# Patient Record
Sex: Female | Born: 1970
Health system: Southern US, Community
[De-identification: ages and names within clinical notes are randomized; demographics above are authoritative.]

## PROBLEM LIST (undated history)

## (undated) DIAGNOSIS — T7840XA Allergy, unspecified, initial encounter: Secondary | ICD-10-CM

## (undated) DIAGNOSIS — M810 Age-related osteoporosis without current pathological fracture: Secondary | ICD-10-CM

## (undated) DIAGNOSIS — C50919 Malignant neoplasm of unspecified site of unspecified female breast: Secondary | ICD-10-CM

## (undated) DIAGNOSIS — Z9221 Personal history of antineoplastic chemotherapy: Secondary | ICD-10-CM

## (undated) HISTORY — PX: TOTAL ABDOMINAL HYSTERECTOMY: SHX209

## (undated) HISTORY — DX: Malignant neoplasm of unspecified site of unspecified female breast: C50.919

## (undated) HISTORY — DX: Allergy, unspecified, initial encounter: T78.40XA

## (undated) HISTORY — PX: BREAST SURGERY: SHX581

## (undated) HISTORY — DX: Age-related osteoporosis without current pathological fracture: M81.0

---

## 2003-01-07 HISTORY — PX: MASTECTOMY: SHX3

## 2003-10-11 HISTORY — PX: TOTAL ABDOMINAL HYSTERECTOMY W/ BILATERAL SALPINGOOPHORECTOMY: SHX83

## 2004-03-12 ENCOUNTER — Ambulatory Visit: Payer: Self-pay | Admitting: Oncology

## 2004-04-12 ENCOUNTER — Ambulatory Visit: Payer: Self-pay | Admitting: Oncology

## 2004-05-12 ENCOUNTER — Ambulatory Visit: Payer: Self-pay | Admitting: Oncology

## 2004-06-16 ENCOUNTER — Ambulatory Visit: Payer: Self-pay | Admitting: Oncology

## 2004-07-13 ENCOUNTER — Ambulatory Visit: Payer: Self-pay | Admitting: Oncology

## 2004-08-10 ENCOUNTER — Ambulatory Visit: Payer: Self-pay | Admitting: Oncology

## 2004-09-10 ENCOUNTER — Ambulatory Visit: Payer: Self-pay | Admitting: Oncology

## 2004-09-26 ENCOUNTER — Encounter: Payer: Self-pay | Admitting: Oncology

## 2004-10-10 ENCOUNTER — Encounter: Payer: Self-pay | Admitting: Oncology

## 2004-10-21 ENCOUNTER — Ambulatory Visit: Payer: Self-pay | Admitting: Oncology

## 2004-11-10 ENCOUNTER — Ambulatory Visit: Payer: Self-pay | Admitting: Oncology

## 2004-11-18 ENCOUNTER — Ambulatory Visit: Payer: Self-pay | Admitting: General Surgery

## 2004-12-10 ENCOUNTER — Ambulatory Visit: Payer: Self-pay | Admitting: Oncology

## 2005-01-13 ENCOUNTER — Ambulatory Visit: Payer: Self-pay | Admitting: Oncology

## 2005-02-10 ENCOUNTER — Ambulatory Visit: Payer: Self-pay | Admitting: Oncology

## 2005-03-12 ENCOUNTER — Ambulatory Visit: Payer: Self-pay | Admitting: Oncology

## 2005-06-23 ENCOUNTER — Ambulatory Visit: Payer: Self-pay | Admitting: Oncology

## 2005-07-13 ENCOUNTER — Ambulatory Visit: Payer: Self-pay | Admitting: Oncology

## 2005-09-26 ENCOUNTER — Ambulatory Visit: Payer: Self-pay | Admitting: General Practice

## 2005-10-10 ENCOUNTER — Ambulatory Visit: Payer: Self-pay | Admitting: General Practice

## 2005-10-13 ENCOUNTER — Ambulatory Visit: Payer: Self-pay | Admitting: Oncology

## 2005-11-10 ENCOUNTER — Ambulatory Visit: Payer: Self-pay | Admitting: General Practice

## 2005-11-15 ENCOUNTER — Ambulatory Visit: Payer: Self-pay | Admitting: General Surgery

## 2005-12-10 ENCOUNTER — Ambulatory Visit: Payer: Self-pay | Admitting: General Practice

## 2005-12-26 ENCOUNTER — Ambulatory Visit: Payer: Self-pay | Admitting: Oncology

## 2006-01-03 ENCOUNTER — Encounter: Payer: Self-pay | Admitting: Family Medicine

## 2006-01-10 ENCOUNTER — Encounter: Payer: Self-pay | Admitting: Family Medicine

## 2006-01-10 ENCOUNTER — Ambulatory Visit: Payer: Self-pay | Admitting: General Practice

## 2006-01-10 ENCOUNTER — Ambulatory Visit: Payer: Self-pay | Admitting: Oncology

## 2006-02-27 ENCOUNTER — Ambulatory Visit: Payer: Self-pay | Admitting: Oncology

## 2006-03-12 ENCOUNTER — Ambulatory Visit: Payer: Self-pay | Admitting: Oncology

## 2006-04-12 ENCOUNTER — Ambulatory Visit: Payer: Self-pay | Admitting: Oncology

## 2006-06-27 ENCOUNTER — Ambulatory Visit: Payer: Self-pay | Admitting: Oncology

## 2006-07-13 ENCOUNTER — Ambulatory Visit: Payer: Self-pay | Admitting: Oncology

## 2006-10-25 ENCOUNTER — Ambulatory Visit: Payer: Self-pay | Admitting: Oncology

## 2006-11-11 ENCOUNTER — Ambulatory Visit: Payer: Self-pay | Admitting: Oncology

## 2006-11-16 ENCOUNTER — Ambulatory Visit: Payer: Self-pay | Admitting: General Surgery

## 2007-02-11 ENCOUNTER — Ambulatory Visit: Payer: Self-pay | Admitting: Oncology

## 2007-02-21 ENCOUNTER — Ambulatory Visit: Payer: Self-pay | Admitting: Oncology

## 2007-03-13 ENCOUNTER — Ambulatory Visit: Payer: Self-pay | Admitting: Oncology

## 2007-06-13 ENCOUNTER — Ambulatory Visit: Payer: Self-pay | Admitting: Oncology

## 2007-06-27 ENCOUNTER — Ambulatory Visit: Payer: Self-pay | Admitting: Oncology

## 2007-07-14 ENCOUNTER — Ambulatory Visit: Payer: Self-pay | Admitting: Oncology

## 2007-07-30 ENCOUNTER — Ambulatory Visit: Payer: Self-pay | Admitting: Family Medicine

## 2007-09-11 ENCOUNTER — Ambulatory Visit: Payer: Self-pay | Admitting: Oncology

## 2007-10-11 ENCOUNTER — Ambulatory Visit: Payer: Self-pay | Admitting: Oncology

## 2007-10-14 ENCOUNTER — Ambulatory Visit: Payer: Self-pay | Admitting: Oncology

## 2007-10-29 ENCOUNTER — Ambulatory Visit: Payer: Self-pay | Admitting: Specialist

## 2007-11-11 ENCOUNTER — Ambulatory Visit: Payer: Self-pay | Admitting: Oncology

## 2007-11-20 ENCOUNTER — Ambulatory Visit: Payer: Self-pay | Admitting: General Surgery

## 2008-01-11 ENCOUNTER — Ambulatory Visit: Payer: Self-pay | Admitting: Oncology

## 2008-01-13 ENCOUNTER — Ambulatory Visit: Payer: Self-pay | Admitting: Oncology

## 2008-02-11 ENCOUNTER — Ambulatory Visit: Payer: Self-pay | Admitting: Oncology

## 2008-02-26 ENCOUNTER — Ambulatory Visit: Payer: Self-pay | Admitting: Unknown Physician Specialty

## 2008-02-28 ENCOUNTER — Ambulatory Visit: Payer: Self-pay | Admitting: Unknown Physician Specialty

## 2008-04-12 ENCOUNTER — Ambulatory Visit: Payer: Self-pay | Admitting: Oncology

## 2008-05-01 ENCOUNTER — Encounter: Payer: Self-pay | Admitting: Family Medicine

## 2008-05-11 ENCOUNTER — Ambulatory Visit: Payer: Self-pay | Admitting: Oncology

## 2008-05-12 ENCOUNTER — Ambulatory Visit: Payer: Self-pay | Admitting: Oncology

## 2008-05-12 ENCOUNTER — Encounter: Payer: Self-pay | Admitting: Family Medicine

## 2008-06-12 ENCOUNTER — Encounter: Payer: Self-pay | Admitting: Family Medicine

## 2008-10-10 ENCOUNTER — Ambulatory Visit: Payer: Self-pay | Admitting: Oncology

## 2008-11-09 ENCOUNTER — Ambulatory Visit: Payer: Self-pay | Admitting: Oncology

## 2008-11-10 ENCOUNTER — Ambulatory Visit: Payer: Self-pay | Admitting: Oncology

## 2008-11-24 ENCOUNTER — Ambulatory Visit: Payer: Self-pay | Admitting: General Surgery

## 2009-04-12 ENCOUNTER — Ambulatory Visit: Payer: Self-pay | Admitting: Oncology

## 2009-05-10 ENCOUNTER — Ambulatory Visit: Payer: Self-pay | Admitting: Oncology

## 2009-05-12 ENCOUNTER — Ambulatory Visit: Payer: Self-pay | Admitting: Oncology

## 2009-10-10 ENCOUNTER — Ambulatory Visit: Payer: Self-pay | Admitting: Oncology

## 2009-11-08 ENCOUNTER — Ambulatory Visit: Payer: Self-pay | Admitting: Oncology

## 2009-11-10 ENCOUNTER — Ambulatory Visit: Payer: Self-pay | Admitting: Oncology

## 2009-11-30 ENCOUNTER — Ambulatory Visit: Payer: Self-pay | Admitting: General Surgery

## 2010-05-20 ENCOUNTER — Ambulatory Visit: Payer: Self-pay | Admitting: Oncology

## 2010-05-21 LAB — CANCER ANTIGEN 27.29: CA 27.29: 13.3 U/mL (ref 0.0–38.6)

## 2010-06-12 ENCOUNTER — Ambulatory Visit: Payer: Self-pay | Admitting: Oncology

## 2010-10-20 ENCOUNTER — Other Ambulatory Visit: Payer: Self-pay | Admitting: Physician Assistant

## 2010-12-01 ENCOUNTER — Ambulatory Visit: Payer: Self-pay | Admitting: Oncology

## 2010-12-02 ENCOUNTER — Ambulatory Visit: Payer: Self-pay | Admitting: Oncology

## 2010-12-03 LAB — CANCER ANTIGEN 27.29: CA 27.29: 11.9 U/mL (ref 0.0–38.6)

## 2010-12-11 ENCOUNTER — Ambulatory Visit: Payer: Self-pay | Admitting: Oncology

## 2011-05-24 ENCOUNTER — Ambulatory Visit: Payer: Self-pay | Admitting: Otolaryngology

## 2011-06-20 ENCOUNTER — Ambulatory Visit: Payer: Self-pay | Admitting: Oncology

## 2011-06-20 LAB — CBC CANCER CENTER
Basophil %: 0.5 %
Eosinophil %: 1.9 %
HCT: 36.2 % (ref 35.0–47.0)
HGB: 12.3 g/dL (ref 12.0–16.0)
Lymphocyte %: 33.9 %
MCHC: 33.8 g/dL (ref 32.0–36.0)
Monocyte %: 8.4 %
Neutrophil #: 2.4 x10 3/mm (ref 1.4–6.5)
Neutrophil %: 55.3 %
RBC: 4.07 10*6/uL (ref 3.80–5.20)

## 2011-06-20 LAB — COMPREHENSIVE METABOLIC PANEL
Albumin: 3.8 g/dL (ref 3.4–5.0)
Alkaline Phosphatase: 79 U/L (ref 50–136)
Anion Gap: 6 — ABNORMAL LOW (ref 7–16)
BUN: 13 mg/dL (ref 7–18)
Co2: 33 mmol/L — ABNORMAL HIGH (ref 21–32)
Creatinine: 0.79 mg/dL (ref 0.60–1.30)
EGFR (Non-African Amer.): >60
Glucose: 81 mg/dL (ref 65–99)
Osmolality: 286 (ref 275–301)
Potassium: 3.7 mmol/L (ref 3.5–5.1)
Sodium: 144 mmol/L (ref 136–145)
Total Protein: 6.9 g/dL (ref 6.4–8.2)

## 2011-07-14 ENCOUNTER — Ambulatory Visit: Payer: Self-pay | Admitting: Oncology

## 2011-09-07 ENCOUNTER — Ambulatory Visit: Payer: Self-pay | Admitting: Specialist

## 2011-09-22 ENCOUNTER — Ambulatory Visit: Payer: Self-pay | Admitting: General Practice

## 2011-09-25 ENCOUNTER — Emergency Department: Payer: Self-pay | Admitting: Emergency Medicine

## 2011-09-25 LAB — URINALYSIS, COMPLETE
Bilirubin,UR: NEGATIVE
Ketone: NEGATIVE
Leukocyte Esterase: NEGATIVE
Ph: 9 (ref 4.5–8.0)
Protein: NEGATIVE
RBC,UR: <1 /HPF (ref 0–5)
Specific Gravity: 1.006 (ref 1.003–1.030)
Squamous Epithelial: <1
WBC UR: 1 /HPF (ref 0–5)

## 2011-09-25 LAB — CBC
HCT: 40.6 % (ref 35.0–47.0)
MCHC: 32.7 g/dL (ref 32.0–36.0)
MCV: 90 fL (ref 80–100)
Platelet: 192 10*3/uL (ref 150–440)
WBC: 6.2 10*3/uL (ref 3.6–11.0)

## 2011-09-25 LAB — COMPREHENSIVE METABOLIC PANEL
Albumin: 3.9 g/dL (ref 3.4–5.0)
Alkaline Phosphatase: 64 U/L (ref 50–136)
Anion Gap: 7 (ref 7–16)
BUN: 10 mg/dL (ref 7–18)
Bilirubin,Total: 0.6 mg/dL (ref 0.2–1.0)
Calcium, Total: 9.2 mg/dL (ref 8.5–10.1)
Creatinine: 0.82 mg/dL (ref 0.60–1.30)
EGFR (Non-African Amer.): >60
Glucose: 79 mg/dL (ref 65–99)
Osmolality: 279 (ref 275–301)
Potassium: 3.6 mmol/L (ref 3.5–5.1)
SGPT (ALT): 19 U/L
Total Protein: 7.1 g/dL (ref 6.4–8.2)

## 2011-09-25 LAB — LIPASE, BLOOD: Lipase: 150 U/L (ref 73–393)

## 2011-11-16 ENCOUNTER — Ambulatory Visit: Payer: Self-pay | Admitting: Oncology

## 2011-12-06 ENCOUNTER — Ambulatory Visit: Payer: Self-pay | Admitting: Oncology

## 2012-01-03 ENCOUNTER — Ambulatory Visit: Payer: Self-pay | Admitting: Oncology

## 2012-01-10 LAB — COMPREHENSIVE METABOLIC PANEL
Albumin: 4.1 g/dL (ref 3.4–5.0)
Anion Gap: 8 (ref 7–16)
BUN: 12 mg/dL (ref 7–18)
Calcium, Total: 9.1 mg/dL (ref 8.5–10.1)
Chloride: 103 mmol/L (ref 98–107)
EGFR (African American): >60
Glucose: 94 mg/dL (ref 65–99)
Osmolality: 281 (ref 275–301)
Potassium: 4.2 mmol/L (ref 3.5–5.1)
Sodium: 141 mmol/L (ref 136–145)
Total Protein: 7.1 g/dL (ref 6.4–8.2)

## 2012-01-10 LAB — CBC CANCER CENTER
Basophil %: 1.1 %
Eosinophil #: 0.1 x10 3/mm (ref 0.0–0.7)
HCT: 38.5 % (ref 35.0–47.0)
HGB: 12.9 g/dL (ref 12.0–16.0)
Lymphocyte %: 38.3 %
MCHC: 33.5 g/dL (ref 32.0–36.0)
Monocyte %: 10.7 %
Neutrophil #: 1.8 x10 3/mm (ref 1.4–6.5)
Neutrophil %: 47.1 %
RBC: 4.25 10*6/uL (ref 3.80–5.20)
WBC: 3.8 x10 3/mm (ref 3.6–11.0)

## 2012-01-11 ENCOUNTER — Ambulatory Visit: Payer: Self-pay | Admitting: Oncology

## 2012-01-11 LAB — CANCER ANTIGEN 27.29: CA 27.29: 5.8 U/mL (ref 0.0–38.6)

## 2012-04-29 ENCOUNTER — Ambulatory Visit: Payer: Self-pay | Admitting: General Practice

## 2012-06-12 ENCOUNTER — Ambulatory Visit: Payer: Self-pay | Admitting: Oncology

## 2013-02-05 ENCOUNTER — Ambulatory Visit: Payer: Self-pay | Admitting: Family Medicine

## 2013-04-29 ENCOUNTER — Emergency Department: Payer: Self-pay | Admitting: Emergency Medicine

## 2013-06-04 ENCOUNTER — Ambulatory Visit: Payer: Self-pay | Admitting: General Practice

## 2013-11-19 ENCOUNTER — Ambulatory Visit (INDEPENDENT_AMBULATORY_CARE_PROVIDER_SITE_OTHER): Payer: 59 | Admitting: Sports Medicine

## 2013-11-19 ENCOUNTER — Encounter: Payer: Self-pay | Admitting: Sports Medicine

## 2013-11-19 VITALS — BP 116/77 | Ht 67.5 in | Wt 144.0 lb

## 2013-11-19 DIAGNOSIS — M6580 Other synovitis and tenosynovitis, unspecified site: Secondary | ICD-10-CM

## 2013-11-19 DIAGNOSIS — IMO0002 Reserved for concepts with insufficient information to code with codable children: Secondary | ICD-10-CM

## 2013-11-19 DIAGNOSIS — R1031 Right lower quadrant pain: Secondary | ICD-10-CM

## 2013-11-19 DIAGNOSIS — R269 Unspecified abnormalities of gait and mobility: Secondary | ICD-10-CM | POA: Insufficient documentation

## 2013-11-19 DIAGNOSIS — M76899 Other specified enthesopathies of unspecified lower limb, excluding foot: Secondary | ICD-10-CM | POA: Insufficient documentation

## 2013-11-19 DIAGNOSIS — S76019A Strain of muscle, fascia and tendon of unspecified hip, initial encounter: Secondary | ICD-10-CM

## 2013-11-19 NOTE — Progress Notes (Signed)
Subjective:   Kendra [name] is a 43 y.o. year old very pleasant female patient who presents with the following:  Right Groin Pain Present since March of this year. Patient had started back a running program after taking time off in winter months. States had pain in right groin radiating to right knee. Pain rated as moderate aching after about 6 minutes of running. She was able to complete a 5k but after that time pain continued to worsen so she stopped running. She does have a history of some low back pain radiating into posterior portion of thigh but this does not feel the same as her current discomfort.   She had previously had some Right knee pain dating to November 2014 which had improved before groin pain started. She had at that time had an MRI showing fat pad impingement as well as a a possible mild tear of LCL (thoughs she is not sure of this diagnosis). She was placed on Mobic and advised to rest and this improved pain ROS- no numbness/tingling in legs. No fecal or urinary issues No changes in weight.   Past Medical History- Breast Cancer 10 years ago that was treated with mastectomy as well as hysterectomy complete as hormone related. She has been cancer free since treatment She is on fosamax and has upcoming bone density study  Surgical History- Hysterectomy and mastectomy  Medications- reviewed and updated Current Outpatient Prescriptions  Medication Sig Dispense Refill  . alendronate (FOSAMAX) 10 MG tablet Take 10 mg by mouth daily before breakfast. Take with a full glass of water on an empty stomach.       No current facility-administered medications for this visit.   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 Nonsmoker  Objective: BP 116/77  Ht 5' 7.5" (1.715 m)  Wt 144 lb (65.318 kg)  BMI 22.21 kg/m2 Gen: NAD, resting comfortably Ext: no edema, mild warmth in R knee compared to left Skin:  warm, dry, no rash   Hip: ROM IR: 80 Deg, ER: 80 Deg, Flexion: 120 Deg, Extension: 100 Deg, Abduction: 45 Deg, Adduction: 45 Deg Strength IR: 5/5, ER: 5/5, Flexion: 5/5, Extension: 5/5, Abduction: 5/5, Adduction: 4-/5 Pelvic alignment unremarkable to inspection and palpation. Standing hip rotation and gait without trendelenburg / unsteadiness. She does have a minor drop in the right hip compared to left with walking and right foot lands early in eversion.  Greater trochanter without tenderness to palpation. No tenderness over piriformis and greater trochanter. No SI joint tenderness and normal minimal SI movement.   MSK U/S:   Right knee- right quadricep tendon without any abnormality, no effusion noted, normal meniscus, right pes anserine bursa with small hypoechoic area likely reflecting mild pes anserine bursitis  Right groin- large hypoechoic region noted in right groin at right iliopectineal bursa likely representing iliopectineal bursitis  Assessment/Plan:  Hip adductor strain/weakness Difficult to tell if patient had weakness and later developed strain or if had strain and later developed hip adductor weakness. Regardless, treatment will be strengthening of hip adductors. Believe weakness has caused gait abnormality as well as pes anserine and iliopectineal bursitis and these will likely improving with correcting underlying issue. We gave patietn a home exercise program.  Offered nitroglycerin patch therapy but patient would like to try strengthening exercises first. We have also placed her in a compression sleeve to use with activity. She is to avoid deep hip flexion with her personal trainer. Follow up in  6 weeks.

## 2013-11-19 NOTE — Patient Instructions (Addendum)
Thanks for coming to see [place]!   Issues found: hip adductor weakness and possible tendopathy, gait abnormality related to hip adductor weakness, bursitis in the groin related to weakness, mild pes anserine bursitis  1. Wear compression sleeve with activity 2. Do exercises we discussed (at least 5 days a week, twice a day if possible)  3 sets of 10-15 (3 exercises) moving right leg across left leg standing, and then move across left leg with knee pulled up, and lifting right leg off of table while laying down.   1 set of 10- 15 ( 3 exercises) right thigh back, to the side, and to the front 3. Avoid deep hip flexion with personal training *opted to not use nitroglycerin patch but we will consider in future

## 2013-11-19 NOTE — Assessment & Plan Note (Signed)
HEP for hip and particularly adductors  Compression sleeve  Progressive increase in exercise with walk/runreck 6 wks  Consider NTG if no response

## 2013-11-19 NOTE — Assessment & Plan Note (Signed)
See if gait improves with strength training  If not consider arch support to RT

## 2013-11-21 ENCOUNTER — Ambulatory Visit: Payer: Self-pay | Admitting: Family Medicine

## 2013-11-21 LAB — HM DEXA SCAN

## 2013-11-24 LAB — LIPID PANEL
Cholesterol: 169 mg/dL (ref 0–200)
HDL: 50 mg/dL (ref 35–70)
LDL Cholesterol: 105 mg/dL
Triglycerides: 70 mg/dL (ref 40–160)

## 2013-11-24 LAB — CBC AND DIFFERENTIAL
HCT: 39 % (ref 36–46)
Hemoglobin: 12.8 g/dL (ref 12.0–16.0)
PLATELETS: 158 10*3/uL (ref 150–399)
WBC: 3.2 10^3/mL

## 2013-11-24 LAB — TSH: TSH: 2.24 u[IU]/mL (ref 0.41–5.90)

## 2013-11-24 LAB — HEPATIC FUNCTION PANEL
ALT: 27 U/L (ref 7–35)
AST: 23 U/L (ref 13–35)

## 2013-11-24 LAB — BASIC METABOLIC PANEL
BUN: 11 mg/dL (ref 4–21)
Creatinine: 0.7 mg/dL (ref 0.5–1.1)
GLUCOSE: 90 mg/dL
POTASSIUM: 4.7 mmol/L (ref 3.4–5.3)
SODIUM: 144 mmol/L (ref 137–147)

## 2014-01-06 ENCOUNTER — Ambulatory Visit: Payer: 59 | Admitting: Sports Medicine

## 2014-01-20 ENCOUNTER — Ambulatory Visit (INDEPENDENT_AMBULATORY_CARE_PROVIDER_SITE_OTHER): Payer: 59 | Admitting: Sports Medicine

## 2014-01-20 ENCOUNTER — Encounter: Payer: Self-pay | Admitting: Sports Medicine

## 2014-01-20 VITALS — BP 100/60 | Ht 67.5 in | Wt 145.0 lb

## 2014-01-20 DIAGNOSIS — M6580 Other synovitis and tenosynovitis, unspecified site: Secondary | ICD-10-CM

## 2014-01-20 DIAGNOSIS — M76899 Other specified enthesopathies of unspecified lower limb, excluding foot: Secondary | ICD-10-CM

## 2014-01-20 MED ORDER — NITROGLYCERIN 0.2 MG/HR TD PT24: MEDICATED_PATCH | TRANSDERMAL | Status: DC

## 2014-01-20 NOTE — Progress Notes (Signed)
   Subjective:   Kendra [name] is a 43 y.o. year old very pleasant female patient who presents with the following right groin pain. Pain initially started in March of 2015. She presented in June with continued discomfort with any kind of physical activity particularly running, cycling and resistance training. Patient's last visit she was found to have a fairly significant iliopectineal bursitis on ultrasound exam as well as a hip abductor strain and weakness. Which is suspected to be related to gait abnormalities. Patient was provided with home exercises to work on strengthening and also provided with a compression sleeve. For the past 6 weeks patient reports improved comfort with a compression sleeve and improve strength with exercising. However she does not feel that she has any improvement in pain. These have discomfort and burning on starting at the abductor insertion point and really into her medial thigh  ROS- no numbness/tingling in legs.   Past Medical History- Breast Cancer 10 years ago that was treated with mastectomy as well as hysterectomy complete as hormone related. She has been cancer free since treatment He has since discontinued Fosamax for osteoporosis.  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 Nonsmoker  Objective: BP 116/77  Ht 5' 7.5" (1.715 m)  Wt 144 lb (65.318 kg)  BMI 22.21 kg/m2 Gen: NAD, resting comfortably Ext: no edema, mild warmth in R knee compared to left Skin: warm, dry, no rash   Hip: ROM IR: and ER: total 80 Deg bilat, Flexion: 120 Deg, Extension: 100 Deg, Abduction: 45 Deg, Adduction: 45 Deg Strength IR: 5/5, ER: 5/5, Flexion: 5/5, Extension: 5/5, Abduction: 5/5, Adduction: 5/5 Pelvic alignment unremarkable to inspection and palpation. Still tender at deep adductor on RT prox thigh Standing hip rotation and gait without trendelenburg / unsteadiness.  No tenderness over piriformis and greater  trochanter. No SI joint tenderness and normal minimal SI movement.  Assessment/Plan:  Hip adductor strain/iliopectineal bursitis:  the patient has had minimal clinical improvement with conservative management of exercises at this point would recommend more aggressive therapy. Advised patient that this is likely from the bursitis found on ultrasound previously at the iliopectineal bursa. Recommended continuing exercises for strengthening she can continue her regular fitness exercise activity as tolerated for pain. But as far as management I think it is time to try a nitroglycerin protocol to help with this tendinopathy. Will followup in 4-6 weeks and at that time will repeat her ultrasound to see if she has any reduction in the size of this bursa inflammation

## 2014-01-20 NOTE — Patient Instructions (Addendum)

## 2014-01-21 NOTE — Assessment & Plan Note (Signed)
See note will try NTG protocol If not improved consider US guided injection to bursa

## 2014-02-06 ENCOUNTER — Ambulatory Visit: Payer: Self-pay | Admitting: Family Medicine

## 2014-02-06 LAB — HM MAMMOGRAPHY

## 2014-03-03 ENCOUNTER — Ambulatory Visit (INDEPENDENT_AMBULATORY_CARE_PROVIDER_SITE_OTHER): Payer: 59 | Admitting: Sports Medicine

## 2014-03-03 ENCOUNTER — Encounter: Payer: Self-pay | Admitting: Sports Medicine

## 2014-03-03 VITALS — BP 107/66 | Ht 67.5 in | Wt 145.0 lb

## 2014-03-03 DIAGNOSIS — M6580 Other synovitis and tenosynovitis, unspecified site: Secondary | ICD-10-CM

## 2014-03-03 DIAGNOSIS — R269 Unspecified abnormalities of gait and mobility: Secondary | ICD-10-CM

## 2014-03-03 DIAGNOSIS — M76899 Other specified enthesopathies of unspecified lower limb, excluding foot: Secondary | ICD-10-CM

## 2014-03-04 ENCOUNTER — Encounter: Payer: Self-pay | Admitting: Sports Medicine

## 2014-03-04 NOTE — Progress Notes (Signed)
   Subjective:   Kendra [name] is a 43 y.o. year old very pleasant female patient who presents with the following right groin pain.   Pain initially started in March of 2015.  She presented in June with continued discomfort with physical activity particularly running, cycling and resistance training.  She was found to have a fairly significant iliopectineal bursitis on ultrasound exam as well as a hip abductor strain and weakness. Which is suspected to be related to gait abnormalities. Patient was provided with home exercises to work on strengthening and also provided with a compression sleeve.   Over the first 6 weeks of treatment patient reports improved comfort with a compression sleeve and improve strength with exercising. However she does not feel that she has any improvement in pain. These have discomfort and burning on starting at the abductor insertion point and really into her medial thigh.  At her last OV early in August patient was recommended to try the nitroglycerin protocol along the adductor tendon insertion, continuing conpression sleeve, and strengthening exercises.  Patient presents today for 3 week f/u after starting Nitro protocol. She reports significant improvement her pain has resolved she has returned to running and cycling with no difficulty. She would like to return to resistance training but wanted to check on any restrictions before starting.   ROS- no numbness/tingling in legs.   Past Medical History- Breast Cancer 10 years ago that was treated with mastectomy as well as hysterectomy complete as hormone related. She has been cancer free since treatment He has since discontinued Fosamax for osteoporosis.  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 Standing hip rotation and gait without trendelenburg / unsteadiness.  No tenderness over piriformis and greater trochanter. No SI joint tenderness and normal minimal SI movement.  MSK [place]: resolution of adductor muscle hypoechoic changes seen previously with great muscle appear.   Assessment/Plan: Hip adductor strain/iliopectineal bursitis: Patient had great clinical response to nitro protocol Recommendations: Continue nitro protocol for 3-4 more week as long as she can tolerate the headaches then discontinue to see how she does Continue strengthening exercises for 6 more weeks Continue thigh sleeve for 3-6 months before discontinuing Patient may start resistance training avoiding deep squats and lunges no greater than 45 degree of knee flexion.

## 2014-04-28 ENCOUNTER — Encounter: Payer: Self-pay | Admitting: Sports Medicine

## 2014-04-28 ENCOUNTER — Ambulatory Visit (INDEPENDENT_AMBULATORY_CARE_PROVIDER_SITE_OTHER): Payer: 59 | Admitting: Sports Medicine

## 2014-04-28 VITALS — BP 113/78 | HR 72 | Ht 67.0 in | Wt 145.0 lb

## 2014-04-28 DIAGNOSIS — R269 Unspecified abnormalities of gait and mobility: Secondary | ICD-10-CM

## 2014-04-28 DIAGNOSIS — M6751 Plica syndrome, right knee: Secondary | ICD-10-CM

## 2014-04-28 NOTE — Patient Instructions (Signed)
Light jogging on a straight line for 100 meters repeat 5 times Pigeon toe walks on a straight line for 100 meter repeat 5 times

## 2014-04-29 DIAGNOSIS — M6750 Plica syndrome, unspecified knee: Secondary | ICD-10-CM | POA: Insufficient documentation

## 2014-04-29 NOTE — Progress Notes (Signed)
   Subjective:   Kendra [name] is a 43 y.o. year old very pleasant female patient who presents with a new c/o right knee pain.   Pain initially started in the past several weeks. She localize the pain on the medial aspect of the patella. She describes the pain as sharp particularly when running, deep bending, and squatting in her workouts. She has not tried any medications, brace, but has been working on hip muscle strength since her adductor strain that was going on the last several months that resolved with Nitro protocol and strengthening.    ROS- no numbness/tingling in legs.   Past Medical History- Breast Cancer 10 years ago that was treated with mastectomy as well as hysterectomy complete as hormone related. She has been cancer free since treatment He has since discontinued Fosamax for osteoporosis.  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 Vascular: Dorsal pedal pulses 2+ bilaterally Neurologically: Sensation to light touch lower extremities equal and intact bilaterally.  Observation: no knee effusion visualized, no previous surgical scars, patella in place, no quad muscle atrophy Palpation:  Tenderness over the medial superior joint capsule TTP to a plica.  No evidence of knee effusion with negative ballotable patella sign No tenderness over the tibial tuberosity or patellar tendons, or quadriceps tendon.   No pain along the lateral joint line or medial joint line Range of motion:  Full ROM at the hip. Full knee flexion to approximately 140, full extension 0, normal patellar tracking Ligamentous testing: ligamentous stablity of ACL, MCL, LCL, and PCL. With Lachman's test, anterior drawer, posterior drawer, valgus and varus  stress test Negative patella apprehension test Meniscal evaluation: Negative McMurray's test, Negative thessaly's test, Normal gait  FOOT EXAM:  General: well nourished Skin of LE: warm; dry, no rashes, lesions, ecchymosis or erythema. Vascular: Dorsal pedal pulses 2+ bilaterally Neurologically: Sensation to light touch lower extremities equal and intact bilaterally.  Observation - no ecchymosis, no edema, or hematoma present  Palpation: no TTP Normal ankle motion and strength bilaterally  Extension/flexion 5/5 strength bilaterally in toes Weight-bearing foot exam:  First ray: neutral  Second ray: neutral Longitudinal arch: mid arch collapse Heel position: mild valgue Gait analysis:  Striking location: on the right she heal strikes on the medial foot Foot motion: pronation  Knee motion: valgus L>R Hip motion: interval rotation  MSK [place]:  Normal PT, QT, medial and lateral meniscus are intact with no splitting. There is a medial plica at the medial superior pole of the knee with no surrounding hypoechoic changes.    Assessment/Plan: Suspect that patient knee pain is from poor biomechanics and inflammation of right knee plica.   Recommendations: Patient was as given sports insole with scaphoid pad to place her in neutral  Quad strengthening with short arch quad with ball squeeze Light jogging on a straight line for 100 meters repeat 5 times Pigeon toe walks on a straight line for 100 meter repeat 5 times

## 2014-05-28 ENCOUNTER — Ambulatory Visit: Payer: 59 | Admitting: Sports Medicine

## 2014-07-31 ENCOUNTER — Ambulatory Visit (INDEPENDENT_AMBULATORY_CARE_PROVIDER_SITE_OTHER): Payer: 59 | Admitting: Sports Medicine

## 2014-07-31 ENCOUNTER — Encounter: Payer: Self-pay | Admitting: Sports Medicine

## 2014-07-31 VITALS — BP 137/86 | Ht 67.0 in | Wt 146.0 lb

## 2014-07-31 DIAGNOSIS — M79672 Pain in left foot: Secondary | ICD-10-CM

## 2014-07-31 DIAGNOSIS — M25561 Pain in right knee: Secondary | ICD-10-CM

## 2014-07-31 NOTE — Patient Instructions (Signed)
Restart your Nitroglycerin and apply 1/4 patch over the inside of the ankle  Please go get the X-rays of your foot and knee  Home Exercise Program: Start with 2 sets of 10 for each of these.  Foot: INTOE walking, InTOE Heel Raises up fast; down slow (over 6-7 seconds)  Knee:  QUAD SETS HAMSTRING  Wear the compression during the day

## 2014-08-03 NOTE — Progress Notes (Signed)
  RADONNA BRACHER - 44 y.o. female MRN [id number]  Date of birth: 06-08-1971  SUBJECTIVE: CC: left foot pain initial evaluation; right medial knee pain follow-up HPI: patient reports worsening left foot pain following her last office visit. She has been having increasing pain while at work and with activity.  Left foot pain is over the medial aspect of her mid foot. Worse with prolonged activity.  Right knee pain is essentially unchanged from prior evaluations.  She has been performing some of the therapeutic exercises for her abductor tendinitis. She is having worsening pain with terminal flexion.  She reports some symptoms of instability but denies any significant locking, popping or actual giving way.   ROS: per HPI  HISTORY:  Past Medical, Surgical, Social, and Family History reviewed & updated per EMR.  Pertinent Historical Findings include:  reports that she has never smoked. She does not have any smokeless tobacco history on file.  OBJECTIVE:  VS:   HT:5\' 7"  (170.2 cm)   WT:146 lb (66.225 kg)  BMI:22.9          BP:137/86 mmHg  HR: bpm  TEMP: ( )  RESP:   PHYSICAL EXAM:  GENERAL: adult, Caucasian. No acute distress PSYCH: Alert and appropriately interactive. VASCULAR: DP andPT pulses 2+/4, no pretibial edema NEURO: Lower extremity strength is 5+/5 in all myotomes; sensation is intact to light touch in all dermatomes. LEFT FOOT: Overall normal-appearing, she does have marked tenderness palpation over the insertion of the tibialis posterior. Overall foot is well aligned. She does have slight splay toe between the first and second bilaterally. Overall moderate PT recruitment with heel raises but does seem to fatigue with one footed. RIGHT KNEE: overall well aligned, no significant effusion,ROM:  0-125. Stable to varus and valgus strain. She does have laxity with anterior drawer and Lachman's with a poor endpoint when compared to the left. Negative McMurray's. Negative Nobles and  Ober test. Hip abduction strength 5+/5.  DATA OBTAINED DURING VISIT: LIMITED MSK ULTRASOUND OF LEFT FOOT: Findings:  Significant thickening and hypoechoic change at the insertion of the posterior tibialis on the navicular with a  Hyperechoic irregularity within the distal insertion. No splitting of the tendons Impression: insertional tendinopathy of the posterior tibialis with either  Excess or navicular or partial avulsion  ASSESSMENT: 1. Right knee pain   2. Left foot pain    No problems updated.   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 Unclear etiology of knee pain. Likely some degenerative changes. 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  PLAN: See problem based charting & AVS for additional documentation.  VMO exercises for right knee, continue body helix  Start nitroglycerin protocol for left ankle  Body Helix left ankle full sleeve compression sleeve provided today. Discussed using during activity as well as for the first 2 hours following activity and PRN.  Continue with sports insoles but trimmed small scaphoid pad, consider removing if continues to be symptomatic  X-rays for right knee and left ankle ordered and patient will obtain in the coming weeks. > Return in about 6 weeks (around 09/11/2014).

## 2014-08-05 ENCOUNTER — Ambulatory Visit: Payer: Self-pay | Admitting: Sports Medicine

## 2014-08-27 ENCOUNTER — Encounter
Admit: 2014-08-27 | Disposition: A | Discharge status: Home / Self Care | Payer: Self-pay | Attending: Physician Assistant | Admitting: Physician Assistant

## 2014-09-01 ENCOUNTER — Encounter: Payer: Self-pay | Admitting: *Deleted

## 2014-09-01 ENCOUNTER — Encounter: Payer: Self-pay | Admitting: Sports Medicine

## 2014-09-02 ENCOUNTER — Ambulatory Visit: Payer: 59 | Admitting: Sports Medicine

## 2014-09-11 ENCOUNTER — Encounter
Admit: 2014-09-11 | Disposition: A | Discharge status: Home / Self Care | Payer: Self-pay | Attending: Physician Assistant | Admitting: Physician Assistant

## 2014-09-18 ENCOUNTER — Encounter: Payer: Self-pay | Admitting: Sports Medicine

## 2014-10-04 NOTE — Op Note (Signed)
PATIENT NAME:  Kendra [name], Kendra [name] MR#:  [id number] DATE OF BIRTH:  [birth year]  DATE OF PROCEDURE:  09/07/2011  PREOPERATIVE DIAGNOSIS: Bilateral de Quervain's tendinitis of the wrist.   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   PROCEDURES:  1. Release de Quervain's tendinitis, right wrist.  2. Release de Quervain's tendinitis, left wrist.   SURGEON: Christophe Louis, M.D.   ANESTHESIA: General.   COMPLICATIONS: None.   DESCRIPTION OF PROCEDURE: After adequate induction of general anesthesia, both upper extremities were thoroughly prepped with alcohol and ChloraPrep and draped in standard sterile fashion. On the right side the arm was wrapped out with the Esmarch bandage and left just distal to the elbow as a tourniquet. Under loupe magnification, standard transverse incision is made over the radial styloid and the dissection carried down to the first dorsal extensor compartment. This is completely released and careful check is made both proximally and distally to ensure that complete release had been obtained. There are three tendons present. Careful check is made to each side to make sure there is not a separate compartment and none is seen. There was a moderate amount of hemorrhage during the case apparently because of the Esmarch bandage. The wound is thoroughly irrigated multiple times. Skin edges are infiltrated with 0.5% Marcaine with epinephrine. The skin is closed with a running subcuticular 3-0 Prolene. A soft bulky dressing is applied. A similar procedure was performed for the left wrist in exactly the same manner except a tourniquet was used for approximately 15 minutes on the left upper arm. I am aware that she has had lymph node dissection for breast cancer previously in the left axilla and the tourniquet was only used for a short period of time and because the Esmarch bandage was not successful on the opposite side. After completing the left side in a  similar fashion, the patient      is returned to the recovery room in satisfactory condition having tolerated the procedure quite well.   ____________________________ Lucas Mallow, MD ces:ap D: 09/07/2011 11:41:59 ET T: 09/07/2011 12:24:13 ET JOB#: [id number]  cc: Lucas Mallow, MD, <Dictator> Lucas Mallow MD ELECTRONICALLY SIGNED 09/12/2011 13:04

## 2015-02-23 ENCOUNTER — Ambulatory Visit (INDEPENDENT_AMBULATORY_CARE_PROVIDER_SITE_OTHER): Payer: 59 | Admitting: Family Medicine

## 2015-02-23 ENCOUNTER — Encounter: Payer: Self-pay | Admitting: Family Medicine

## 2015-02-23 VITALS — BP 114/76 | HR 76 | Temp 98.3°F | Resp 16 | Ht 67.5 in | Wt 151.0 lb

## 2015-02-23 DIAGNOSIS — C50919 Malignant neoplasm of unspecified site of unspecified female breast: Secondary | ICD-10-CM

## 2015-02-23 DIAGNOSIS — Z0000 Encounter for general adult medical examination without abnormal findings: Secondary | ICD-10-CM

## 2015-02-23 DIAGNOSIS — M549 Dorsalgia, unspecified: Secondary | ICD-10-CM | POA: Insufficient documentation

## 2015-02-23 DIAGNOSIS — M810 Age-related osteoporosis without current pathological fracture: Secondary | ICD-10-CM | POA: Insufficient documentation

## 2015-02-23 DIAGNOSIS — S134XXA Sprain of ligaments of cervical spine, initial encounter: Secondary | ICD-10-CM | POA: Insufficient documentation

## 2015-02-23 DIAGNOSIS — Z1239 Encounter for other screening for malignant neoplasm of breast: Secondary | ICD-10-CM | POA: Diagnosis not present

## 2015-02-23 DIAGNOSIS — Z853 Personal history of malignant neoplasm of breast: Secondary | ICD-10-CM | POA: Insufficient documentation

## 2015-02-23 DIAGNOSIS — J309 Allergic rhinitis, unspecified: Secondary | ICD-10-CM | POA: Insufficient documentation

## 2015-02-23 NOTE — Progress Notes (Signed)
Patient ID: Kendra [name], female   DOB: 1970-07-11, 44 y.o.   MRN: [id number]        [name], Female    DOB: 18-Nov-1970, 44 y.o.   MRN: [id number] Visit Date: 02/23/2015  Today's Provider: Margarita Rana, MD   Chief Complaint  Patient presents with  . Annual Exam   Subjective:    Annual physical exam Kendra [name] is a 44 y.o. female who presents today for health maintenance and complete physical. She feels well. She reports exercising 4 days a week. She reports she is sleeping fairly well.  11/21/13 CPE 02/03/14 Mammogram-BI-RADS 1 11/21/13 BMD-WNL 10/2003  Pap-hysterectomy   Lab Results  Component Value Date   WBC 3.2 11/24/2013   HGB 12.8 11/24/2013   HCT 39 11/24/2013   PLT 158 11/24/2013   GLUCOSE 94 01/10/2012   CHOL 169 11/24/2013   TRIG 70 11/24/2013   HDL 50 11/24/2013   LDLCALC 105 11/24/2013   ALT 27 11/24/2013   AST 23 11/24/2013   NA 144 11/24/2013   K 4.7 11/24/2013   CL 103 01/10/2012   CREATININE 0.7 11/24/2013   BUN 11 11/24/2013   CO2 30 01/10/2012   TSH 2.24 11/24/2013       Review of Systems  Constitutional: Negative.   HENT: Negative.   Eyes: Negative.   Respiratory: Negative.   Cardiovascular: Negative.   Gastrointestinal: Negative.   Endocrine: Negative.   Genitourinary: Negative.   Musculoskeletal: Negative.   Skin: Negative.   Allergic/Immunologic: Negative.   Neurological: Negative.   Hematological: Negative.   Psychiatric/Behavioral: Negative.     Social History She  reports that she has never smoked. She has never used smokeless tobacco. She reports that she does not drink alcohol or use illicit drugs. Social History   Social History  . Marital Status: Married    Spouse Name: Emerald Shor  . Number of Children: 2  . Years of Education: N/A   Social History Main Topics  . Smoking status: Never Smoker   . Smokeless tobacco: Never Used  . Alcohol Use: No  . Drug Use: No  . Sexual Activity: Not  Asked   Other Topics Concern  . None   Social History Narrative    Patient Active Problem List   Diagnosis Date Noted  . Allergic rhinitis 02/23/2015  . Back ache 02/23/2015  . Breast CA 02/23/2015  . Personal history of malignant neoplasm of breast 02/23/2015  . OP (osteoporosis) 02/23/2015  . Acceleration-deceleration injury of neck 02/23/2015  . Plica of knee 45/80/9983  . Adductor tendinitis 11/19/2013  . Abnormality of gait 11/19/2013    Past Surgical History  Procedure Laterality Date  . Total abdominal hysterectomy      related to breast cancer  . Total abdominal hysterectomy w/ bilateral salpingoophorectomy  10/2003  . Mastectomy Left 01/07/03  . Breast surgery      implant left in 2004, and biopsy    Family History  Family Status  Relation Status Death Age  . Mother Alive   . Father Deceased 68    lung cancer  . Brother Alive    Her family history includes Alcohol abuse in her brother; Arthritis in her mother; Cancer in her mother; Hypothyroidism in her mother.    No Known Allergies  Previous Medications   ALLERGY RELIEF/NASAL DECONGEST 10-240 MG PER 24 HR TABLET    Take 1 tablet by mouth daily.    CALCIUM-VITAMIN D 600-200 MG-UNIT PER  TABLET    Take 1 tablet by mouth daily.    DIAZEPAM (VALIUM) 10 MG TABLET    Take 10 mg by mouth.    FLUTICASONE (FLONASE) 50 MCG/ACT NASAL SPRAY    Place 1 spray into both nostrils daily.    VITAMIN D, CHOLECALCIFEROL, PO    Take 1 capsule by mouth daily.    Patient Care Team: Margarita Rana, MD as PCP - General (Family Medicine)     Objective:   Vitals: BP 114/76 mmHg  Pulse 76  Temp(Src) 98.3 F (36.8 C) (Oral)  Resp 16  Ht 5' 7.5" (1.715 m)  Wt 151 lb (68.493 kg)  BMI 23.29 kg/m2   Physical Exam  Constitutional: She is oriented to person, place, and time. She appears well-developed and well-nourished.  HENT:  Head: Normocephalic and atraumatic.  Right Ear: Tympanic membrane, external ear and ear canal  normal.  Left Ear: Tympanic membrane, external ear and ear canal normal.  Nose: Nose normal.  Mouth/Throat: Uvula is midline, oropharynx is clear and moist and mucous membranes are normal.  Eyes: Conjunctivae, EOM and lids are normal. Pupils are equal, round, and reactive to light.  Neck: Trachea normal and normal range of motion. Neck supple. Carotid bruit is not present. No thyroid mass and no thyromegaly present.  Cardiovascular: Normal rate, regular rhythm and normal heart sounds.   Pulmonary/Chest: Effort normal and breath sounds normal.  Well healed biopsy scar. Mastectomy and implant scars noted on left. No nipple.   Abdominal: Soft. Normal appearance and bowel sounds are normal. There is no hepatosplenomegaly. There is no tenderness.  Genitourinary: No breast swelling, tenderness or discharge.  Musculoskeletal: Normal range of motion.  Lymphadenopathy:    She has no cervical adenopathy.    She has no axillary adenopathy.  Neurological: She is alert and oriented to person, place, and time. She has normal strength. No cranial nerve deficit.  Skin: Skin is warm, dry and intact.  Psychiatric: She has a normal mood and affect. Her speech is normal and behavior is normal. Judgment and thought content normal. Cognition and memory are normal.     Depression Screen PHQ 2/9 Scores 02/23/2015 07/31/2014 04/28/2014  PHQ - 2 Score 0 0 0  Exception Documentation - Other- indicate reason in comment box -      Assessment & Plan:     Routine Health Maintenance and Physical Exam  Exercise Activities and Dietary recommendations Goals    . Exercise 150 minutes per week (moderate activity)        There is no immunization history on file for this patient.  Health Maintenance  Topic Date Due  . HIV Screening  05/11/1985  . TETANUS/TDAP  05/11/1989  . PAP SMEAR  05/12/1991  . INFLUENZA VACCINE  01/11/2015   1. Annual physical exam Continue to eat healthy, exercise and will check  labs.  - CBC with Differential/Platelet - Comprehensive metabolic panel - Lipid Panel With LDL/HDL Ratio - TSH  2. Breast cancer screening Will schedule.  - MM DIGITAL SCREENING BILATERAL; Future  3. Breast CA, unspecified laterality Check labs.  - MM DIGITAL SCREENING BILATERAL; Future - Cancer antigen 27.29      -------------------------------------------------------------------- Patient was seen and examined by Jerrell Belfast, MD, and note scribed by Lynford Humphrey, [place].     I have reviewed the document for accuracy and completeness and I agree with above. Jerrell Belfast, MD   Margarita Rana, MD

## 2015-02-23 NOTE — Patient Instructions (Signed)
I recommend that you get a flu vaccine this year.  Please call the [facility] at [facility] to schedule this at [phone]

## 2015-02-27 LAB — CBC WITH DIFFERENTIAL/PLATELET
BASOS: 1 %
Basophils Absolute: 0 10*3/uL (ref 0.0–0.2)
EOS (ABSOLUTE): 0.1 10*3/uL (ref 0.0–0.4)
Eos: 1 %
Hematocrit: 40.3 % (ref 34.0–46.6)
Hemoglobin: 12.8 g/dL (ref 11.1–15.9)
IMMATURE GRANULOCYTES: 0 %
Immature Grans (Abs): 0 10*3/uL (ref 0.0–0.1)
LYMPHS ABS: 1.2 10*3/uL (ref 0.7–3.1)
Lymphs: 31 %
MCH: 29.4 pg (ref 26.6–33.0)
MCHC: 31.8 g/dL (ref 31.5–35.7)
MCV: 92 fL (ref 79–97)
MONOS ABS: 0.4 10*3/uL (ref 0.1–0.9)
Monocytes: 10 %
NEUTROS PCT: 57 %
Neutrophils Absolute: 2.3 10*3/uL (ref 1.4–7.0)
PLATELETS: 198 10*3/uL (ref 150–379)
RBC: 4.36 x10E6/uL (ref 3.77–5.28)
RDW: 13.3 % (ref 12.3–15.4)
WBC: 4 10*3/uL (ref 3.4–10.8)

## 2015-02-27 LAB — COMPREHENSIVE METABOLIC PANEL
A/G RATIO: 2 (ref 1.1–2.5)
ALK PHOS: 74 IU/L (ref 39–117)
ALT: 43 IU/L — AB (ref 0–32)
AST: 30 IU/L (ref 0–40)
Albumin: 4.4 g/dL (ref 3.5–5.5)
BUN/Creatinine Ratio: 15 (ref 9–23)
BUN: 13 mg/dL (ref 6–24)
Bilirubin Total: 0.8 mg/dL (ref 0.0–1.2)
CALCIUM: 9.7 mg/dL (ref 8.7–10.2)
CHLORIDE: 102 mmol/L (ref 97–108)
CO2: 30 mmol/L — ABNORMAL HIGH (ref 18–29)
Creatinine, Ser: 0.84 mg/dL (ref 0.57–1.00)
GFR calc Af Amer: 98 mL/min/{1.73_m2} (ref >59)
GFR, EST NON AFRICAN AMERICAN: 85 mL/min/{1.73_m2} (ref >59)
GLOBULIN, TOTAL: 2.2 g/dL (ref 1.5–4.5)
Glucose: 84 mg/dL (ref 65–99)
POTASSIUM: 4.5 mmol/L (ref 3.5–5.2)
SODIUM: 143 mmol/L (ref 134–144)
Total Protein: 6.6 g/dL (ref 6.0–8.5)

## 2015-02-27 LAB — LIPID PANEL WITH LDL/HDL RATIO
CHOLESTEROL TOTAL: 184 mg/dL (ref 100–199)
HDL: 60 mg/dL (ref >39)
LDL Calculated: 109 mg/dL — ABNORMAL HIGH (ref 0–99)
LDL/HDL RATIO: 1.8 ratio (ref 0.0–3.2)
TRIGLYCERIDES: 74 mg/dL (ref 0–149)
VLDL Cholesterol Cal: 15 mg/dL (ref 5–40)

## 2015-02-27 LAB — CANCER ANTIGEN 27.29: CA 27.29: 8.5 U/mL (ref 0.0–38.6)

## 2015-02-27 LAB — TSH: TSH: 2.94 u[IU]/mL (ref 0.450–4.500)

## 2015-03-02 ENCOUNTER — Telehealth: Payer: Self-pay

## 2015-03-02 NOTE — Telephone Encounter (Signed)
-----   Message from Margarita Rana, MD sent at 02/27/2015 12:58 PM EDT ----- Labs stable. Please notify patient. Thanks.

## 2015-03-02 NOTE — Telephone Encounter (Signed)
Pt advised.   Thanks,   -Laura  

## 2015-03-02 NOTE — Telephone Encounter (Signed)
Pt returned Laura's call. Thanks TNP

## 2015-03-02 NOTE — Telephone Encounter (Signed)
LMTCB 03/02/2015  Thanks,   -Mickel Baas

## 2015-03-04 ENCOUNTER — Ambulatory Visit
Admission: RE | Admit: 2015-03-04 | Discharge: 2015-03-04 | Disposition: A | Discharge status: Home / Self Care | Payer: 59 | Source: Ambulatory Visit | Attending: Family Medicine | Admitting: Family Medicine

## 2015-03-04 ENCOUNTER — Other Ambulatory Visit: Payer: Self-pay | Admitting: Family Medicine

## 2015-03-04 DIAGNOSIS — Z1231 Encounter for screening mammogram for malignant neoplasm of breast: Secondary | ICD-10-CM | POA: Diagnosis not present

## 2015-03-04 DIAGNOSIS — Z1239 Encounter for other screening for malignant neoplasm of breast: Secondary | ICD-10-CM

## 2015-03-04 DIAGNOSIS — C50919 Malignant neoplasm of unspecified site of unspecified female breast: Secondary | ICD-10-CM

## 2015-04-12 ENCOUNTER — Telehealth: Payer: Self-pay | Admitting: Physician Assistant

## 2015-04-12 DIAGNOSIS — J3089 Other allergic rhinitis: Secondary | ICD-10-CM

## 2015-04-13 MED ORDER — FLUTICASONE PROPIONATE 50 MCG/ACT NA SUSP
1.0000 | Freq: Every day | NASAL | Status: DC
Start: 2015-04-13 — End: 2016-05-08

## 2015-04-13 NOTE — Telephone Encounter (Signed)
Med refill

## 2015-05-20 ENCOUNTER — Other Ambulatory Visit: Payer: Self-pay | Admitting: Physician Assistant

## 2015-05-26 ENCOUNTER — Other Ambulatory Visit: Payer: Self-pay | Admitting: Physician Assistant

## 2015-05-28 NOTE — Telephone Encounter (Signed)
Received fax from [facility] for Valacyclovir HCL 1gr Per Marnee Spring faxed back to pharmacy

## 2015-06-15 DIAGNOSIS — L0889 Other specified local infections of the skin and subcutaneous tissue: Secondary | ICD-10-CM | POA: Diagnosis not present

## 2015-06-15 DIAGNOSIS — D225 Melanocytic nevi of trunk: Secondary | ICD-10-CM | POA: Diagnosis not present

## 2015-06-15 DIAGNOSIS — L820 Inflamed seborrheic keratosis: Secondary | ICD-10-CM | POA: Diagnosis not present

## 2015-06-15 DIAGNOSIS — L538 Other specified erythematous conditions: Secondary | ICD-10-CM | POA: Diagnosis not present

## 2015-06-15 DIAGNOSIS — D2261 Melanocytic nevi of right upper limb, including shoulder: Secondary | ICD-10-CM | POA: Diagnosis not present

## 2015-06-15 DIAGNOSIS — L821 Other seborrheic keratosis: Secondary | ICD-10-CM | POA: Diagnosis not present

## 2015-06-15 DIAGNOSIS — B078 Other viral warts: Secondary | ICD-10-CM | POA: Diagnosis not present

## 2015-06-15 DIAGNOSIS — D2262 Melanocytic nevi of left upper limb, including shoulder: Secondary | ICD-10-CM | POA: Diagnosis not present

## 2015-07-22 ENCOUNTER — Encounter: Payer: Self-pay | Admitting: Sports Medicine

## 2015-07-22 ENCOUNTER — Ambulatory Visit (INDEPENDENT_AMBULATORY_CARE_PROVIDER_SITE_OTHER): Payer: 59 | Admitting: Sports Medicine

## 2015-07-22 VITALS — BP 136/64 | HR 69 | Ht 67.5 in | Wt 150.0 lb

## 2015-07-22 DIAGNOSIS — M7632 Iliotibial band syndrome, left leg: Secondary | ICD-10-CM

## 2015-07-22 DIAGNOSIS — M7630 Iliotibial band syndrome, unspecified leg: Secondary | ICD-10-CM | POA: Insufficient documentation

## 2015-07-22 NOTE — Progress Notes (Signed)
  [name] - 45 y.o. female MRN [id number]  Date of birth: [birth year]  SUBJECTIVE:  Including CC & ROS.  Kendra [name] is a 45 y.o. female who presents today for Left Knee pain     Knee pain Left, Visit 07/22/15 - patient presents today for acute knee pain, lateral aspect that began yesterday when running on the treadmill. States that it is located distal to the joint line and more of a sharp pain with any extension on swing phase. No previous injury to her knee and she denies any swelling at this time. No locking catching or giving way. She has not done anything for this to date. No previous injury.   PMHx - Updated and reviewed.  Contributory factors include: Breast cancer and adductor tendinitis  PSHx - Updated and reviewed.  Contributory factors include:  Mastectomy  FHx - Updated and reviewed.  Contributory factors include:  None  Medications - updated and reviewed   12 point ROS negative other than per HPI.   Exam:  Filed Vitals:   07/22/15 1137  BP: 136/64  Pulse: 69    Gen: NAD Cardiorespiratory - Normal respiratory effort/rate.  RRR Hip Exam:  Pelvic alignment unremarkable to inspection and palpation. Standing hip rotation and gait without trendelenburg / unsteadiness. Greater trochanter without tenderness to palpation. No tenderness over piriformis and greater trochanter. No SI joint tenderness and normal minimal SI movement. ROM: IR: 80 Deg, ER: 80 Deg, Flexion: 120 Deg, Extension: 100 Deg, Abduction: 45 Deg, Adduction: 45 Deg Strength:  IR: 5/5, ER: 5/5, Flexion (0 and 90 degrees): 5/5, Extension: 5/5, Abduction: 5/5, Adduction: 5/5 + [employer] testing with TTP Gerdy's Tubercle on L   Neurovascularly intact B/L LE

## 2015-07-22 NOTE — Assessment & Plan Note (Signed)
Pain most likely secondary to abductor weakness of her hips on the right with over compensation on the left/over striding with internal rotation of the tibia stretching her IT band at [facility] tubercle. - Hip exercises and stretches - Compression to L knee - Ice, nsaids, massage/foam roller - Avoid aggravating activity for next 1-2 weeks and f/u PRN.

## 2015-08-20 ENCOUNTER — Ambulatory Visit (INDEPENDENT_AMBULATORY_CARE_PROVIDER_SITE_OTHER): Payer: 59 | Admitting: Family Medicine

## 2015-08-20 ENCOUNTER — Encounter: Payer: Self-pay | Admitting: Family Medicine

## 2015-08-20 VITALS — BP 122/76 | HR 72 | Temp 98.8°F | Resp 12 | Ht 67.5 in | Wt 157.0 lb

## 2015-08-20 DIAGNOSIS — C50919 Malignant neoplasm of unspecified site of unspecified female breast: Secondary | ICD-10-CM

## 2015-08-20 DIAGNOSIS — Z8739 Personal history of other diseases of the musculoskeletal system and connective tissue: Secondary | ICD-10-CM

## 2015-08-20 DIAGNOSIS — M810 Age-related osteoporosis without current pathological fracture: Secondary | ICD-10-CM

## 2015-08-20 DIAGNOSIS — Z1239 Encounter for other screening for malignant neoplasm of breast: Secondary | ICD-10-CM

## 2015-08-20 DIAGNOSIS — Z0000 Encounter for general adult medical examination without abnormal findings: Secondary | ICD-10-CM | POA: Diagnosis not present

## 2015-08-20 LAB — POCT URINALYSIS DIPSTICK
BILIRUBIN UA: NEGATIVE
Blood, UA: NEGATIVE
Glucose, UA: NEGATIVE
KETONES UA: NEGATIVE
LEUKOCYTES UA: NEGATIVE
NITRITE UA: NEGATIVE
PH UA: 6
PROTEIN UA: NEGATIVE
Spec Grav, UA: <=1.005
Urobilinogen, UA: NEGATIVE

## 2015-08-20 NOTE — Progress Notes (Signed)
Patient ID: Kendra [name], female   DOB: 13-Jan-1971, 45 y.o.   MRN: [id number]       [name], Female    DOB: August 14, 1970, 45 y.o.   MRN: [id number] Visit Date: 08/20/2015  Today's Provider: Margarita Rana, MD   Chief Complaint  Patient presents with  . Annual Exam   Subjective:    Annual physical exam Kendra [name] is a 45 y.o. female who presents today for health maintenance and complete physical. She feels well. She reports exercising daily. She reports she is sleeping well.  Last mammogram-03/04/15 Pap smear-patient states she had hysterectomy-total.  Colonoscopy- never. Tdap-patient states she has this information written down and will provide that to [place] at a later time.  BMD-11/29/2013 osteopenia  Review of Systems  Constitutional: Negative.   HENT: Negative.   Eyes: Negative.   Respiratory: Negative.   Cardiovascular: Negative.   Gastrointestinal: Negative.   Endocrine: Negative.   Genitourinary: Negative.   Musculoskeletal: Negative.   Skin: Negative.   Allergic/Immunologic: Negative.   Neurological: Negative.   Hematological: Negative.   Psychiatric/Behavioral: Negative.     Social History      She  reports that she has never smoked. She has never used smokeless tobacco. She reports that she does not drink alcohol or use illicit drugs.       Social History   Social History  . Marital Status: Married    Spouse Name: Gillian Rittenour  . Number of Children: 2  . Years of Education: N/A   Social History Main Topics  . Smoking status: Never Smoker   . Smokeless tobacco: Never Used  . Alcohol Use: No  . Drug Use: No  . Sexual Activity: Yes    Birth Control/ Protection: Other-see comments     Comment: had total hysterectomy   Other Topics Concern  . None   Social History Narrative    Past Medical History  Diagnosis Date  . Breast cancer ([place])     10 years ago  . Allergy   . Osteoporosis      Patient Active Problem List   Diagnosis  Date Noted  . 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 Allergic rhinitis 02/23/2015  . Back ache 02/23/2015  . Breast CA ([place]) 02/23/2015  . Personal history of malignant neoplasm of breast 02/23/2015  . OP (osteoporosis) 02/23/2015  . Acceleration-deceleration injury of neck 02/23/2015  . Plica of knee [id number]  . Adductor tendinitis 11/19/2013  . Abnormality of gait 11/19/2013    Past Surgical History  Procedure Laterality Date  . Total abdominal hysterectomy      related to breast cancer  . Total abdominal hysterectomy w/ bilateral salpingoophorectomy  10/2003  . Mastectomy Left 01/07/03  . Breast surgery      implant left in 2004, and biopsy    Family History        Family Status  Relation Status Death Age  . Mother Alive   . Father Deceased 48    lung cancer  . Brother Alive         Her family history includes Alcohol abuse in her brother; Arthritis in her mother; Cancer in her mother; Hypothyroidism in her mother.    No Known Allergies  Previous Medications   ALLERGY RELIEF/NASAL DECONGEST 10-240 MG PER 24 HR TABLET    Take 1 tablet by mouth daily.    CALCIUM-VITAMIN D 600-200 MG-UNIT PER TABLET    Take 1 tablet by mouth daily.  FLUTICASONE (FLONASE) 50 MCG/ACT NASAL SPRAY    Place 1 spray into both nostrils daily.   VALACYCLOVIR (VALTREX) 1000 MG TABLET       VITAMIN D, CHOLECALCIFEROL, PO    Take 1 capsule by mouth daily.    Patient Care Team: Margarita Rana, MD as PCP - General (Family Medicine)     Objective:   Vitals: BP 122/76 mmHg  Pulse 72  Temp(Src) 98.8 F (37.1 C)  Resp 12  Ht 5' 7.5" (1.715 m)  Wt 157 lb (71.215 kg)  BMI 24.21 kg/m2   Physical Exam  Constitutional: She is oriented to person, place, and time. She appears well-developed and well-nourished.  HENT:  Head: Normocephalic and atraumatic.  Right Ear: External ear normal.  Left Ear: External ear normal.  Mouth/Throat: Oropharynx is clear and moist.  Eyes: Conjunctivae are normal.  Pupils are equal, round, and reactive to light.  Neck: Normal range of motion. Neck supple. No thyromegaly present.  Cardiovascular: Normal rate, regular rhythm, normal heart sounds and intact distal pulses.   No murmur heard. Pulmonary/Chest: Effort normal and breath sounds normal. No respiratory distress. She has no wheezes.  Left breast with mastectomy and reconstruction noted.  No nipple on left.  Scars well healed.   Abdominal: Soft. Bowel sounds are normal. She exhibits no distension. There is no tenderness.  Musculoskeletal: Normal range of motion. She exhibits no edema or tenderness.  Neurological: She is alert and oriented to person, place, and time. No cranial nerve deficit.  Skin: No rash noted. No erythema.  Psychiatric: She has a normal mood and affect. Her behavior is normal. Judgment and thought content normal.     Depression Screen PHQ 2/9 Scores 08/20/2015 07/22/2015 02/23/2015 07/31/2014  PHQ - 2 Score 0 0 0 0  Exception Documentation - - - Other- indicate reason in comment box      Assessment & Plan:   1. Annual physical exam Defer pap smear today. Patient had total hysterectomy. Need to wait on labs until September 2017-labs to include at that time Vitamin D and Cancer antigen check. - POCT urinalysis dipstick  2. History of osteopenia Needs BMD ordered in September 2017.  Will be order by new physician or Grace Bushy, PA. Patient knows to call to have scheduled. Currently  Off Fosamax.   3. Breast cancer screening Due in September 2017. - VITAMIN D 25 Hydroxy (Vit-D Deficiency, Fractures) - Comprehensive Metabolic Panel (CMET) - CBC with Differential/Platelet - TSH - Lipid panel - VITAMIN D 25 Hydroxy (Vit-D Deficiency, Fractures)  4. Malignant neoplasm of female breast, unspecified laterality, unspecified site of breast [facility]) - Cancer Antigen 27.29   Patient was seen and examined by Jerrell Belfast, MD, and note scribed by Theressa Millard ,  [place].   I have reviewed the document for accuracy and completeness and I agree with above. Jerrell Belfast, MD   Margarita Rana, MD

## 2015-08-25 DIAGNOSIS — M810 Age-related osteoporosis without current pathological fracture: Secondary | ICD-10-CM | POA: Diagnosis not present

## 2015-08-25 DIAGNOSIS — Z0000 Encounter for general adult medical examination without abnormal findings: Secondary | ICD-10-CM | POA: Diagnosis not present

## 2015-08-25 DIAGNOSIS — C50919 Malignant neoplasm of unspecified site of unspecified female breast: Secondary | ICD-10-CM | POA: Diagnosis not present

## 2015-08-26 LAB — CBC WITH DIFFERENTIAL/PLATELET
BASOS ABS: 0 10*3/uL (ref 0.0–0.2)
Basos: 1 %
EOS (ABSOLUTE): 0.1 10*3/uL (ref 0.0–0.4)
Eos: 1 %
HEMATOCRIT: 43 % (ref 34.0–46.6)
HEMOGLOBIN: 13.7 g/dL (ref 11.1–15.9)
Immature Grans (Abs): 0 10*3/uL (ref 0.0–0.1)
Immature Granulocytes: 0 %
LYMPHS: 32 %
Lymphocytes Absolute: 1.5 10*3/uL (ref 0.7–3.1)
MCH: 29.7 pg (ref 26.6–33.0)
MCHC: 31.9 g/dL (ref 31.5–35.7)
MCV: 93 fL (ref 79–97)
MONOCYTES: 8 %
Monocytes Absolute: 0.4 10*3/uL (ref 0.1–0.9)
NEUTROS ABS: 2.6 10*3/uL (ref 1.4–7.0)
NEUTROS PCT: 58 %
NRBC: 0 % (ref 0–0)
Platelets: 204 10*3/uL (ref 150–379)
RBC: 4.62 x10E6/uL (ref 3.77–5.28)
RDW: 13.5 % (ref 12.3–15.4)
WBC: 4.5 10*3/uL (ref 3.4–10.8)

## 2015-08-26 LAB — COMPREHENSIVE METABOLIC PANEL
A/G RATIO: 1.9 (ref 1.2–2.2)
ALBUMIN: 4.7 g/dL (ref 3.5–5.5)
ALK PHOS: 76 IU/L (ref 39–117)
ALT: 40 IU/L — ABNORMAL HIGH (ref 0–32)
AST: 30 IU/L (ref 0–40)
BILIRUBIN TOTAL: 0.6 mg/dL (ref 0.0–1.2)
BUN / CREAT RATIO: 19 (ref 9–23)
BUN: 15 mg/dL (ref 6–24)
CHLORIDE: 99 mmol/L (ref 96–106)
CO2: 26 mmol/L (ref 18–29)
Calcium: 10 mg/dL (ref 8.7–10.2)
Creatinine, Ser: 0.81 mg/dL (ref 0.57–1.00)
GFR calc Af Amer: 101 mL/min/{1.73_m2} (ref >59)
GFR calc non Af Amer: 88 mL/min/{1.73_m2} (ref >59)
GLUCOSE: 88 mg/dL (ref 65–99)
Globulin, Total: 2.5 g/dL (ref 1.5–4.5)
POTASSIUM: 4.3 mmol/L (ref 3.5–5.2)
SODIUM: 140 mmol/L (ref 134–144)
Total Protein: 7.2 g/dL (ref 6.0–8.5)

## 2015-08-26 LAB — CANCER ANTIGEN 27.29: CA 27.29: 14.6 U/mL (ref 0.0–38.6)

## 2015-08-26 LAB — LIPID PANEL
CHOL/HDL RATIO: 3.7 ratio (ref 0.0–4.4)
Cholesterol, Total: 202 mg/dL — ABNORMAL HIGH (ref 100–199)
HDL: 55 mg/dL (ref >39)
LDL CALC: 126 mg/dL — AB (ref 0–99)
Triglycerides: 104 mg/dL (ref 0–149)
VLDL CHOLESTEROL CAL: 21 mg/dL (ref 5–40)

## 2015-08-26 LAB — VITAMIN D 25 HYDROXY (VIT D DEFICIENCY, FRACTURES): Vit D, 25-Hydroxy: 39.9 ng/mL (ref 30.0–100.0)

## 2015-08-26 LAB — TSH: TSH: 3.09 u[IU]/mL (ref 0.450–4.500)

## 2015-08-27 ENCOUNTER — Telehealth: Payer: Self-pay

## 2015-08-27 NOTE — Telephone Encounter (Signed)
Patient advised as below.  

## 2015-08-27 NOTE — Telephone Encounter (Signed)
-----   Message from Margarita Rana, MD sent at 08/26/2015  8:37 PM EDT ----- Labs stable. Cholesterol stable at 202 with high good cholesterol.  10 year risk of heart disease is very low at 1 percent. Other labs stable except for one very mildly elevated liver enzyme.  Was slightly elevated at last check and usually returns to normal on it's own. Please see if this has every been an issue in the past. No tylenol or etoh and recheck in 4 weeks. Stay well hydrated.  Thanks

## 2015-10-11 ENCOUNTER — Other Ambulatory Visit: Payer: Self-pay | Admitting: Physician Assistant

## 2015-10-12 NOTE — Telephone Encounter (Signed)
Med refill approved 

## 2015-10-19 DIAGNOSIS — M5416 Radiculopathy, lumbar region: Secondary | ICD-10-CM | POA: Diagnosis not present

## 2015-10-19 DIAGNOSIS — M955 Acquired deformity of pelvis: Secondary | ICD-10-CM | POA: Diagnosis not present

## 2015-10-19 DIAGNOSIS — M9903 Segmental and somatic dysfunction of lumbar region: Secondary | ICD-10-CM | POA: Diagnosis not present

## 2015-10-19 DIAGNOSIS — M9905 Segmental and somatic dysfunction of pelvic region: Secondary | ICD-10-CM | POA: Diagnosis not present

## 2015-10-25 ENCOUNTER — Encounter: Payer: Self-pay | Admitting: Family Medicine

## 2015-10-25 DIAGNOSIS — R748 Abnormal levels of other serum enzymes: Secondary | ICD-10-CM

## 2015-10-29 DIAGNOSIS — R748 Abnormal levels of other serum enzymes: Secondary | ICD-10-CM | POA: Diagnosis not present

## 2015-10-30 LAB — COMPREHENSIVE METABOLIC PANEL
A/G RATIO: 2.1 (ref 1.2–2.2)
ALBUMIN: 4.5 g/dL (ref 3.5–5.5)
ALT: 16 IU/L (ref 0–32)
AST: 21 IU/L (ref 0–40)
Alkaline Phosphatase: 66 IU/L (ref 39–117)
BILIRUBIN TOTAL: 0.7 mg/dL (ref 0.0–1.2)
BUN / CREAT RATIO: 22 (ref 9–23)
BUN: 16 mg/dL (ref 6–24)
CALCIUM: 9.6 mg/dL (ref 8.7–10.2)
CHLORIDE: 101 mmol/L (ref 96–106)
CO2: 26 mmol/L (ref 18–29)
Creatinine, Ser: 0.72 mg/dL (ref 0.57–1.00)
GFR, EST AFRICAN AMERICAN: 117 mL/min/{1.73_m2} (ref >59)
GFR, EST NON AFRICAN AMERICAN: 101 mL/min/{1.73_m2} (ref >59)
Globulin, Total: 2.1 g/dL (ref 1.5–4.5)
Glucose: 87 mg/dL (ref 65–99)
Potassium: 4.7 mmol/L (ref 3.5–5.2)
Sodium: 142 mmol/L (ref 134–144)
TOTAL PROTEIN: 6.6 g/dL (ref 6.0–8.5)

## 2015-11-01 ENCOUNTER — Telehealth: Payer: Self-pay

## 2015-11-01 NOTE — Telephone Encounter (Signed)
Patient advised as below.  

## 2015-11-01 NOTE — Telephone Encounter (Signed)
-----   Message from Margarita Rana, MD sent at 10/31/2015 10:48 AM EDT ----- Labs are all within normal limits.  Thanks.

## 2015-11-04 DIAGNOSIS — H52223 Regular astigmatism, bilateral: Secondary | ICD-10-CM | POA: Diagnosis not present

## 2015-11-04 DIAGNOSIS — H5213 Myopia, bilateral: Secondary | ICD-10-CM | POA: Diagnosis not present

## 2015-11-04 DIAGNOSIS — D3131 Benign neoplasm of right choroid: Secondary | ICD-10-CM | POA: Diagnosis not present

## 2015-11-04 DIAGNOSIS — H524 Presbyopia: Secondary | ICD-10-CM | POA: Diagnosis not present

## 2015-11-24 DIAGNOSIS — M955 Acquired deformity of pelvis: Secondary | ICD-10-CM | POA: Diagnosis not present

## 2015-11-24 DIAGNOSIS — M5416 Radiculopathy, lumbar region: Secondary | ICD-10-CM | POA: Diagnosis not present

## 2015-11-24 DIAGNOSIS — M9905 Segmental and somatic dysfunction of pelvic region: Secondary | ICD-10-CM | POA: Diagnosis not present

## 2015-11-24 DIAGNOSIS — M9903 Segmental and somatic dysfunction of lumbar region: Secondary | ICD-10-CM | POA: Diagnosis not present

## 2015-12-07 DIAGNOSIS — M955 Acquired deformity of pelvis: Secondary | ICD-10-CM | POA: Diagnosis not present

## 2015-12-07 DIAGNOSIS — M5416 Radiculopathy, lumbar region: Secondary | ICD-10-CM | POA: Diagnosis not present

## 2015-12-07 DIAGNOSIS — M9905 Segmental and somatic dysfunction of pelvic region: Secondary | ICD-10-CM | POA: Diagnosis not present

## 2015-12-07 DIAGNOSIS — M9903 Segmental and somatic dysfunction of lumbar region: Secondary | ICD-10-CM | POA: Diagnosis not present

## 2015-12-21 ENCOUNTER — Encounter: Payer: Self-pay | Admitting: Family Medicine

## 2015-12-21 DIAGNOSIS — Z1239 Encounter for other screening for malignant neoplasm of breast: Secondary | ICD-10-CM

## 2015-12-21 DIAGNOSIS — Z780 Asymptomatic menopausal state: Secondary | ICD-10-CM

## 2015-12-27 ENCOUNTER — Telehealth: Payer: Self-pay | Admitting: Family Medicine

## 2015-12-27 DIAGNOSIS — Z1239 Encounter for other screening for malignant neoplasm of breast: Secondary | ICD-10-CM

## 2015-12-27 NOTE — Telephone Encounter (Signed)
Order in epic. 

## 2015-12-27 NOTE — Telephone Encounter (Signed)
Pt needs an order for uni right mammogram screening.Pt has a history of left breast mastectomy.This was a pt of Dr Sharyon Medicus.Thanks

## 2016-01-18 ENCOUNTER — Ambulatory Visit: Payer: 59

## 2016-02-17 ENCOUNTER — Ambulatory Visit
Admission: RE | Admit: 2016-02-17 | Discharge: 2016-02-17 | Disposition: A | Discharge status: Home / Self Care | Payer: 59 | Source: Ambulatory Visit | Attending: Physician Assistant | Admitting: Physician Assistant

## 2016-02-17 ENCOUNTER — Ambulatory Visit
Admission: RE | Admit: 2016-02-17 | Discharge: 2016-02-17 | Disposition: A | Discharge status: Home / Self Care | Payer: 59 | Source: Ambulatory Visit | Attending: Family Medicine | Admitting: Family Medicine

## 2016-02-17 DIAGNOSIS — M85852 Other specified disorders of bone density and structure, left thigh: Secondary | ICD-10-CM | POA: Insufficient documentation

## 2016-02-17 DIAGNOSIS — Z780 Asymptomatic menopausal state: Secondary | ICD-10-CM | POA: Diagnosis not present

## 2016-02-17 DIAGNOSIS — M8588 Other specified disorders of bone density and structure, other site: Secondary | ICD-10-CM | POA: Insufficient documentation

## 2016-02-17 DIAGNOSIS — Z1239 Encounter for other screening for malignant neoplasm of breast: Secondary | ICD-10-CM | POA: Insufficient documentation

## 2016-02-17 DIAGNOSIS — M85862 Other specified disorders of bone density and structure, left lower leg: Secondary | ICD-10-CM | POA: Diagnosis not present

## 2016-02-17 DIAGNOSIS — Z1231 Encounter for screening mammogram for malignant neoplasm of breast: Secondary | ICD-10-CM | POA: Diagnosis not present

## 2016-02-18 ENCOUNTER — Encounter: Payer: Self-pay | Admitting: Family Medicine

## 2016-02-22 NOTE — Progress Notes (Signed)
[name] [address] [place], [place] [address] Phone: [phone] Subjective:    I'm seeing this patient by the request  of:  Margarita Rana, MD  CC: Knee pain and runner  [id number]  Kendra [name] is a 45 y.o. female coming in with complaint of knee pain. 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 Has had bilateral knee pain previously.   patient's chart was reviewed patient's previous imaging of the right knee in February 2016 was independent living visualized by me showing no bony abnormality. Patient also had an MRI of the right knee that showed a inflammation of the fat pad but otherwise unremarkable for any other bony abnormality for meniscal injury. Past medical history significant for osteopenia  Patient states that continuing have some on the pain. Seems to be on the lateral aspect of the knees. Patient states that she's been running because she thinks it will help her bone mineral density. Patient states that anytime she runs she has significant amount of spasming on the lateral aspects just proximal to the knees. Patient states that it sometimes can keep her up at night. Has been working with a trainer times a week as well. States that it does not seem to get any more irritating when she is with a trainer. Denies any symptoms radiation down the legs, denies any associated back pain. Rates the severity of pain as 5 out of 10 in severity.  Past Medical History:  Diagnosis Date  . Allergy   . Breast cancer ([place]) left   2004 with chemo tx  . Osteoporosis    Past Surgical History:  Procedure Laterality Date  . BREAST SURGERY     implant left in 2004, and biopsy  . MASTECTOMY Left 01/07/03   with tram flap and right breast lift in 2008  . TOTAL ABDOMINAL HYSTERECTOMY     related to breast cancer  . TOTAL ABDOMINAL HYSTERECTOMY W/ BILATERAL SALPINGOOPHORECTOMY  10/2003   Social History    Social History  . Marital status: Married    Spouse name: Seniqua Gilliard  . Number of children: 2  . Years of education: N/A   Social History Main Topics  . Smoking status: Never Smoker  . Smokeless tobacco: Never Used  . Alcohol use No  . Drug use: No  . Sexual activity: Yes    Birth control/ protection: Other-see comments     Comment: had total hysterectomy   Other Topics Concern  . None   Social History Narrative  . None   No Known Allergies Family History  Problem Relation Age of Onset  . Arthritis Mother   . Hypothyroidism Mother   . Breast cancer Mother 108  . Alcohol abuse Brother     Past medical history, social, surgical and family history all reviewed in electronic medical record.  No pertanent information unless stated regarding to the chief complaint.   Review of Systems: No headache, visual changes, nausea, vomiting, diarrhea, constipation, dizziness, abdominal pain, skin rash, fevers, chills, night sweats, weight loss, swollen lymph nodes, body aches, joint swelling, muscle aches, chest pain, shortness of breath, mood changes.   Objective  Blood pressure 124/70, pulse (!) 52, height 5' 6.5" (1.689 m), weight 150 lb (68 kg), SpO2 98 %.  General: No apparent distress alert and oriented x3 mood and affect normal, dressed appropriately.  HEENT: Pupils equal, extraocular movements intact  Respiratory: Patient's speak in full sentences and  does not appear short of breath  Cardiovascular: No lower extremity edema, non tender, no erythema  Skin: Warm dry intact with no signs of infection or rash on extremities or on axial skeleton.  Abdomen: Soft nontender  Neuro: Cranial nerves II through XII are intact, neurovascularly intact in all extremities with 2+ DTRs and 2+ pulses.  Lymph: No lymphadenopathy of posterior or anterior cervical chain or axillae bilaterally.  Gait normal with good balance and coordination.  MSK:  Non tender with full range of motion and good  stability and symmetric strength and tone of shoulders, elbows, wrist, hip, and ankles bilaterally.  Knee:Bilateral Varus deformity of the knees bilaterally Minimal tenderness over the distal iliotibial band ROM full in flexion and extension and lower leg rotation. Ligaments with solid consistent endpoints including ACL, PCL, LCL, MCL. Mild positive patellar compression with mild crepitus Patellar and quadriceps tendons unremarkable. Hamstring and quadriceps strength is normal.    MSK US performed of: Bilateral This study was ordered, performed, and interpreted by Charlann Boxer D.O.  Knee: All structures visualized. Anteromedial, anterolateral, posteromedial, and posterolateral menisci unremarkable without tearing, fraying, effusion, or displacement. Patellar Tendon unremarkable on long and transverse views without effusion. No abnormality of prepatellar bursa. LCL and MCL unremarkable on long and transverse views. No abnormality of origin of medial or lateral head of the gastrocnemius.  IMPRESSION:  NORMAL ULTRASONOGRAPHIC EXAMINATION OF THE KNEE.  Procedure note  [id number]; 15 minutes spent for Therapeutic exercises as stated in above notes.  This included exercises focusing on stretching, strengthening, with significant focus on eccentric aspects.  Patellofemoral Syndrome  Reviewed anatomy using anatomical model and how PFS occurs.  Given rehab exercises handout for VMO, hip abductors, core, entire kinetic chain including proprioception exercises including cone touches, step downs, hip elevations and turn outs.  Could benefit from PT, regular exercise, upright biking, and a PFS knee brace to assist with tracking abnormalities.  Proper technique shown and discussed handout in great detail with ATC.  All questions were discussed and answered.   Impression and Recommendations:     This case required medical decision making of moderate complexity.      Note: This dictation was  prepared with Dragon dictation along with smaller phrase technology. Any transcriptional errors that result from this process are unintentional.

## 2016-02-23 ENCOUNTER — Ambulatory Visit (INDEPENDENT_AMBULATORY_CARE_PROVIDER_SITE_OTHER): Payer: 59 | Admitting: Family Medicine

## 2016-02-23 ENCOUNTER — Other Ambulatory Visit: Payer: Self-pay

## 2016-02-23 ENCOUNTER — Encounter: Payer: Self-pay | Admitting: Family Medicine

## 2016-02-23 VITALS — BP 124/70 | HR 52 | Ht 66.5 in | Wt 150.0 lb

## 2016-02-23 DIAGNOSIS — M25562 Pain in left knee: Principal | ICD-10-CM

## 2016-02-23 DIAGNOSIS — M25561 Pain in right knee: Secondary | ICD-10-CM

## 2016-02-23 DIAGNOSIS — M21169 Varus deformity, not elsewhere classified, unspecified knee: Secondary | ICD-10-CM | POA: Insufficient documentation

## 2016-02-23 NOTE — Patient Instructions (Signed)
Good to see you.  Ice 20 minutes 2 times daily. Usually after activity and before bed. Exercises 3 times a week.  Exercises on wall.  Heel and butt touching.  Raise leg 6 inches and hold 2 seconds.  Down slow for count of 4 seconds.  1 set of 30 reps daily on both sides.  Vitamin D 4000 IU daily  Turmeric 500mg  twice daily  Would decrease running to 1-2 times a week and cross train with biking or elliptical [employer] "total support" online would be great  See me again in 4-6 weeks to make sure you are doing well.

## 2016-02-23 NOTE — Assessment & Plan Note (Signed)
Bilateral varus deformity of the knees. I think that this puts excessive stress on the lateral aspect of the knees. Patient also has significant weakness of the hip abductors. Work with Product/process development scientist to learn home exercises in greater detail. We discussed icing regimen, discussed proper shoes and over-the-counter medications. Patient is on once weekly vitamin D and encourage her to continue. Patient work uncertain strengthening exercises and do more crosstraining. Follow-up again with me in 4-6 weeks for further evaluation and treatment.

## 2016-02-24 ENCOUNTER — Encounter: Payer: 59 | Admitting: Family Medicine

## 2016-03-23 NOTE — Progress Notes (Signed)
[name] [place], [place] [address] Phone: [phone] Subjective:    I'm seeing this patient by the request  of:  Margarita Rana, MD  CC: Knee pain and runner f/u   [id number]  Kendra [name] is a 45 y.o. female coming in with complaint of knee pain. 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 Has had bilateral knee pain previously. Patient was seen and was found to have more of a varus deformity of the knees. Ultrasound was fairly unremarkable. Patient elected try conservative therapy and changed some of her exercise routine. Patient was put on high doses of vitamin D and was to work on hip abductor strengthening. Patient states She is making some progress. Doing the exercises fairly regularly. Patient states that the pain is less severe and less frequent but when she still has that it seems to be almost as bad. Patient has noticed that she has not felt some tight around the knee. May be making progress. Some mild low back pain. Describes it as a dull, throbbing aching pain. Nothing that is severe. Patient states though that this can keep her from running more than the lateral hip or knee pain.    Past medical history for problem:   patient's chart was reviewed patient's previous imaging of the right knee in February 2016 was independent living visualized by me showing no bony abnormality. Patient also had an MRI of the right knee that showed a inflammation of the fat pad but otherwise unremarkable for any other bony abnormality for meniscal injury. Past medical history significant for osteopenia    Past Medical History:  Diagnosis Date  . Allergy   . Breast cancer ([place]) left   2004 with chemo tx  . Osteoporosis    Past Surgical History:  Procedure Laterality Date  . BREAST SURGERY     implant left in 2004, and biopsy  . MASTECTOMY Left 01/07/03   with tram flap and right breast lift  in 2008  . TOTAL ABDOMINAL HYSTERECTOMY     related to breast cancer  . TOTAL ABDOMINAL HYSTERECTOMY W/ BILATERAL SALPINGOOPHORECTOMY  10/2003   Social History   Social History  . Marital status: Married    Spouse name: Marne Varella  . Number of children: 2  . Years of education: N/A   Social History Main Topics  . Smoking status: Never Smoker  . Smokeless tobacco: Never Used  . Alcohol use No  . Drug use: No  . Sexual activity: Yes    Birth control/ protection: Other-see comments     Comment: had total hysterectomy   Other Topics Concern  . Not on file   Social History Narrative  . No narrative on file   No Known Allergies Family History  Problem Relation Age of Onset  . Arthritis Mother   . Hypothyroidism Mother   . Breast cancer Mother 76  . Alcohol abuse Brother     Past medical history, social, surgical and family history all reviewed in electronic medical record.  No pertanent information unless stated regarding to the chief complaint.   Review of Systems: No headache, visual changes, nausea, vomiting, diarrhea, constipation, dizziness, abdominal pain, skin rash, fevers, chills, night sweats, weight loss, swollen lymph nodes, body aches, joint swelling, muscle aches, chest pain, shortness of breath, mood changes.   Objective  There were no vitals taken for this visit.  General: No apparent distress alert and  oriented x3 mood and affect normal, dressed appropriately.  HEENT: Pupils equal, extraocular movements intact  Respiratory: Patient's speak in full sentences and does not appear short of breath  Cardiovascular: No lower extremity edema, non tender, no erythema  Skin: Warm dry intact with no signs of infection or rash on extremities or on axial skeleton.  Abdomen: Soft nontender  Neuro: Cranial nerves II through XII are intact, neurovascularly intact in all extremities with 2+ DTRs and 2+ pulses.  Lymph: No lymphadenopathy of posterior or anterior cervical  chain or axillae bilaterally.  Gait normal with good balance and coordination.  MSK:  Non tender with full range of motion and good stability and symmetric strength and tone of shoulders, elbows, wrist, hip, and ankles bilaterally.  Back Exam:  Inspection: Unremarkable  Motion: Flexion 45 deg, Extension 35 deg, Side Bending to 45 deg bilaterally,  Rotation to 40 deg bilaterally  SLR laying: Negative  XSLR laying: Negative  Palpable tenderness: Tender to palpation of the right sacroiliac joint. FABER: negative. Sensory change: Gross sensation intact to all lumbar and sacral dermatomes.  Reflexes: 2+ at both patellar tendons, 2+ at achilles tendons, Babinski's downgoing.  Strength at foot  Plantar-flexion: 5/5 Dorsi-flexion: 5/5 Eversion: 5/5 Inversion: 5/5  Leg strength  Quad: 5/5 Hamstring: 5/5 Hip flexor: 5/5 Hip abductors: 5/5  Gait unremarkable.  Knee:Bilateral Varus deformity of the knees bilaterallyNontender over the distal iliotibial band which is new and improved  ROM full in flexion and extension and lower leg rotation. Ligaments with solid consistent endpoints including ACL, PCL, LCL, MCL.Still mild pain with patellar compression  Patellar and quadriceps tendons unremarkable. Hamstring and quadriceps strength is normal.    Impression and Recommendations:     This case required medical decision making of moderate complexity.      Note: This dictation was prepared with Dragon dictation along with smaller phrase technology. Any transcriptional errors that result from this process are unintentional.

## 2016-03-24 ENCOUNTER — Ambulatory Visit (INDEPENDENT_AMBULATORY_CARE_PROVIDER_SITE_OTHER): Payer: 59 | Admitting: Family Medicine

## 2016-03-24 ENCOUNTER — Encounter: Payer: Self-pay | Admitting: Family Medicine

## 2016-03-24 VITALS — BP 108/78 | HR 65 | Wt 149.0 lb

## 2016-03-24 DIAGNOSIS — M999 Biomechanical lesion, unspecified: Secondary | ICD-10-CM | POA: Diagnosis not present

## 2016-03-24 DIAGNOSIS — M7632 Iliotibial band syndrome, left leg: Secondary | ICD-10-CM | POA: Diagnosis not present

## 2016-03-24 DIAGNOSIS — M21169 Varus deformity, not elsewhere classified, unspecified knee: Secondary | ICD-10-CM | POA: Diagnosis not present

## 2016-03-24 DIAGNOSIS — M533 Sacrococcygeal disorders, not elsewhere classified: Secondary | ICD-10-CM | POA: Diagnosis not present

## 2016-03-24 MED ORDER — VITAMIN D (ERGOCALCIFEROL) 1.25 MG (50000 UNIT) PO CAPS: 50000.0000 [IU] | ORAL_CAPSULE | ORAL | 0 refills | Status: DC

## 2016-03-24 NOTE — Assessment & Plan Note (Signed)
I do believe the patient's weakness of the outside of the hip is likely still keep. Making some progress. Started on once weekly vitamin D. I do think that this will be beneficial. We discussed icing regimen. Discussed continuing the same strengthening exercises. Follow-up again in 4-6 weeks. Did respond well to manipulation. Follow-up again in 4-6 weeks as we stated previously.

## 2016-03-24 NOTE — Assessment & Plan Note (Signed)
Decision today to treat with OMT was based on Physical Exam  After verbal consent patient was treated with HVLA, ME techniques in thoracic, lumbar and sacral areas  Patient tolerated the procedure well with improvement in symptoms  Patient given exercises, stretches and lifestyle modifications  See medications in patient instructions if given  Patient will follow up in 3-4 weeks  

## 2016-03-24 NOTE — Assessment & Plan Note (Signed)
Sacroiliac Joint Mobilization and Rehab 1. Work on pretzel stretching, shoulder back and leg draped in front. 3-5 sets, 30 sec.. 2. hip abductor rotations. standing, hip flexion and rotation outward then inward. 3 sets, 15 reps. when can do comfortably, add ankle weights starting at 2 pounds.  3. cross over stretching - shoulder back to ground, same side leg crossover. 3-5 sets for 30 min..  4. rolling up and back knees to chest and rocking. 5. sacral tilt - 5 sets, hold for 5-10 seconds

## 2016-03-24 NOTE — Patient Instructions (Addendum)
God to see you  You are making progress.  Ice is your friend Once weekly vitamin D and stop the daily vitamin D Stay active.  We did manipulation today and you should do good.  See me again in 4-6 weeks.

## 2016-04-21 ENCOUNTER — Ambulatory Visit: Payer: 59 | Admitting: Family Medicine

## 2016-04-27 NOTE — Progress Notes (Signed)
[name] [name], [place] [address] Phone: [phone] Subjective:    I'm seeing this patient by the request  of:  Kendra Rana, MD  CC: Knee pain and runner f/u   [id number]  Kendra [name] is a 45 y.o. female coming in with complaint of knee pain. 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 Has had bilateral knee pain previously. Patient was seen and was found to have more of a varus deformity of the knees.Patient is been doing conservative therapy and is making some progress. Patient states as long she continues to try to increase the hip abductor strengthening patient seems to do relatively well.   Past medical history for problem:   patient's chart was reviewed patient's previous imaging of the right knee in February 2016 was independent living visualized by me showing no bony abnormality. Patient also had an MRI of the right knee that showed a inflammation of the fat pad but otherwise unremarkable for any other bony abnormality for meniscal injury. Past medical history significant for osteopenia    Past Medical History:  Diagnosis Date  . Allergy   . Breast cancer ([place]) left   2004 with chemo tx  . Osteoporosis    Past Surgical History:  Procedure Laterality Date  . BREAST SURGERY     implant left in 2004, and biopsy  . MASTECTOMY Left 01/07/03   with tram flap and right breast lift in 2008  . TOTAL ABDOMINAL HYSTERECTOMY     related to breast cancer  . TOTAL ABDOMINAL HYSTERECTOMY W/ BILATERAL SALPINGOOPHORECTOMY  10/2003   Social History   Social History  . Marital status: Married    Spouse name: Chryl Wayson  . Number of children: 2  . Years of education: N/A   Social History Main Topics  . Smoking status: Never Smoker  . Smokeless tobacco: Never Used  . Alcohol use No  . Drug use: No  . Sexual activity: Yes    Birth control/ protection: Other-see comments    Comment: had total hysterectomy   Other Topics Concern  . None   Social History Narrative  . None   No Known Allergies Family History  Problem Relation Age of Onset  . Arthritis Mother   . Hypothyroidism Mother   . Breast cancer Mother 45  . Alcohol abuse Brother     Past medical history, social, surgical and family history all reviewed in electronic medical record.  No pertanent information unless stated regarding to the chief complaint.   Review of Systems: No headache, visual changes, nausea, vomiting, diarrhea, constipation, dizziness, abdominal pain, skin rash, fevers, chills, night sweats, weight loss, swollen lymph nodes, body aches, joint swelling, muscle aches, chest pain, shortness of breath, mood changes.   Objective  Blood pressure 120/72, pulse 62, height 5' 7.5" (1.715 m), weight 150 lb (68 kg), SpO2 98 %.  Systems examined below as of 04/28/16 General: NAD A&O x3 mood, affect normal  HEENT: Pupils equal, extraocular movements intact no nystagmus Respiratory: not short of breath at rest or with speaking Cardiovascular: No lower extremity edema, non tender Skin: Warm dry intact with no signs of infection or rash on extremities or on axial skeleton. Abdomen: Soft nontender, no masses Neuro: Cranial nerves  intact, neurovascularly intact in all extremities with 2+ DTRs and 2+ pulses. Lymph: No lymphadenopathy appreciated today  Gait normal with good balance and coordination.  MSK: Non tender with  full range of motion and good stability and symmetric strength and tone of shoulders, elbows, wrist,  hips and ankles bilaterally.    Back Exam:  Inspection: Unremarkable  Motion: Flexion 45 deg, Extension 25 deg, Side Bending to 45 deg bilaterally,  Rotation to 40 deg bilaterally  SLR laying: Negative  XSLR laying: Negative  Palpable tenderness: Continue mild tenderness of the right sacroiliac joint FABER: Positive right side. Sensory change: Gross sensation intact to  all lumbar and sacral dermatomes.  Reflexes: 2+ at both patellar tendons, 2+ at achilles tendons, Babinski's downgoing.  Strength at foot  Plantar-flexion: 5/5 Dorsi-flexion: 5/5 Eversion: 5/5 Inversion: 5/5  Leg strength  Quad: 5/5 Hamstring: 5/5 Hip flexor: 5/5 Hip abductors: 5/5  Gait unremarkable.  Knee:Bilateral Varus deformity of the knees bilaterally Nontender on exam ROM full in flexion and extension and lower leg rotation. Ligaments with solid consistent endpoints including ACL, PCL, LCL, MCL. Patellar and quadriceps tendons unremarkable. Hamstring and quadriceps strength is normal.     Osteopathic findings C4 flexed rotated and side bent right  T3 extended rotated and side bent right with inhaled third rib L1 flexed rotated and side bent right Sacrum left on left   Impression and Recommendations:     This case required medical decision making of moderate complexity.      Note: This dictation was prepared with Dragon dictation along with smaller phrase technology. Any transcriptional errors that result from this process are unintentional.

## 2016-04-28 ENCOUNTER — Encounter: Payer: Self-pay | Admitting: Family Medicine

## 2016-04-28 ENCOUNTER — Ambulatory Visit (INDEPENDENT_AMBULATORY_CARE_PROVIDER_SITE_OTHER): Payer: 59 | Admitting: Family Medicine

## 2016-04-28 VITALS — BP 120/72 | HR 62 | Ht 67.5 in | Wt 150.0 lb

## 2016-04-28 DIAGNOSIS — M533 Sacrococcygeal disorders, not elsewhere classified: Secondary | ICD-10-CM

## 2016-04-28 DIAGNOSIS — M999 Biomechanical lesion, unspecified: Secondary | ICD-10-CM | POA: Diagnosis not present

## 2016-04-28 NOTE — Assessment & Plan Note (Signed)
Discussed again with patient at great length. Patient continues to have some weakness of the hip abductors. Encourage him to do some more regular basis. We discussed proper shoewear the could help with some alignment. Patient come back and see me again in 4-6 weeks.

## 2016-04-28 NOTE — Patient Instructions (Signed)
God to see you  1/4 cup of gatorade in your watere with working out.  Vega sport protein would be great within 30 minute sof working out.  75grams of protein daily is goal Put orthotics in your daily shoes, this may help with fatigue of the hip See me again in 4-6 weeks!

## 2016-04-28 NOTE — Assessment & Plan Note (Signed)
Decision today to treat with OMT was based on Physical Exam  After verbal consent patient was treated with HVLA, ME techniques in thoracic, lumbar and sacral areas  Patient tolerated the procedure well with improvement in symptoms  Patient given exercises, stretches and lifestyle modifications  See medications in patient instructions if given  Patient will follow up in 4-6 weeks          

## 2016-05-08 ENCOUNTER — Other Ambulatory Visit: Payer: Self-pay | Admitting: Physician Assistant

## 2016-05-08 DIAGNOSIS — J3089 Other allergic rhinitis: Secondary | ICD-10-CM

## 2016-05-25 NOTE — Progress Notes (Signed)
[name] [place],  [address] Phone: [phone] Subjective:    I'm seeing this patient by the request  of:  Margarita Rana, MD  CC: Knee pain and runner f/u   [id number]  Kendra [name] is a 45 y.o. female coming in with complaint of knee pain. 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 Has had bilateral knee pain previously. Patient was seen and was found to have more of a varus deformity of the knees.Patient is been doing conservative therapy and is making some progress. Patient states as long she continues to try to increase the hip abductor strengthening patient seems to do relatively well. Her running again. Started having increasing pain on the lateral aspect of the left knee. Sometimes feels like she is unable to move the knee after running no. Can do biking and lifting with no significant pain. Still states that all the pain seems to be on the lateral aspect.  Patient also has had some mild low back pain. Has responded very well to osteopathic manipulation previously. Patient states that she has been feeling good overall.   Past medical history for problem:   patient's chart was reviewed patient's previous imaging of the right knee in February 2016 was independent living visualized by me showing no bony abnormality. Patient also had an MRI of the right knee that showed a inflammation of the fat pad but otherwise unremarkable for any other bony abnormality for meniscal injury. Past medical history significant for osteopenia    Past Medical History:  Diagnosis Date  . Allergy   . Breast cancer ([place]) left   2004 with chemo tx  . Osteoporosis    Past Surgical History:  Procedure Laterality Date  . BREAST SURGERY     implant left in 2004, and biopsy  . MASTECTOMY Left 01/07/03   with tram flap and right breast lift in 2008  . TOTAL ABDOMINAL HYSTERECTOMY     related to  breast cancer  . TOTAL ABDOMINAL HYSTERECTOMY W/ BILATERAL SALPINGOOPHORECTOMY  10/2003   Social History   Social History  . Marital status: Married    Spouse name: Keeli Achter  . Number of children: 2  . Years of education: N/A   Social History Main Topics  . Smoking status: Never Smoker  . Smokeless tobacco: Never Used  . Alcohol use No  . Drug use: No  . Sexual activity: Yes    Birth control/ protection: Other-see comments     Comment: had total hysterectomy   Other Topics Concern  . None   Social History Narrative  . None   No Known Allergies Family History  Problem Relation Age of Onset  . Arthritis Mother   . Hypothyroidism Mother   . Breast cancer Mother 64  . Alcohol abuse Brother     Past medical history, social, surgical and family history all reviewed in electronic medical record.  No pertanent information unless stated regarding to the chief complaint.   Review of Systems: No headache, visual changes, nausea, vomiting, diarrhea, constipation, dizziness, abdominal pain, skin rash, fevers, chills, night sweats, weight loss, swollen lymph nodes, body aches, joint swelling, muscle aches, chest pain, shortness of breath, mood changes.    Objective  Blood pressure 118/78, pulse 71, height 5' 7.5" (1.715 m), weight 152 lb (68.9 kg), SpO2 99 %.  Systems examined below as of 05/26/16 General: NAD A&O x3 mood, affect normal  HEENT:  Pupils equal, extraocular movements intact no nystagmus Respiratory: not short of breath at rest or with speaking Cardiovascular: No lower extremity edema, non tender Skin: Warm dry intact with no signs of infection or rash on extremities or on axial skeleton. Abdomen: Soft nontender, no masses Neuro: Cranial nerves  intact, neurovascularly intact in all extremities with 2+ DTRs and 2+ pulses. Lymph: No lymphadenopathy appreciated today  Gait normal with good balance and coordination.  MSK: Non tender with full range of motion and good  stability and symmetric strength and tone of shoulders, elbows, wrist, hips and ankles bilaterally.     Back Exam:  Inspection: Unremarkable  Motion: Flexion 45 deg, Extension 20 deg, Side Bending to 40 deg bilaterally,  Rotation to 40 deg bilaterally  SLR laying: Negative  XSLR laying: Negative  Palpable tenderness: Continue mild tenderness of the right sacroiliac joint FABER: Continued tightness on the right side Sensory change: Gross sensation intact to all lumbar and sacral dermatomes.  Reflexes: 2+ at both patellar tendons, 2+ at achilles tendons, Babinski's downgoing.  Strength at foot  Plantar-flexion: 5/5 Dorsi-flexion: 5/5 Eversion: 5/5 Inversion: 5/5  Leg strength  Quad: 5/5 Hamstring: 5/5 Hip flexor: 5/5 Hip abductors: 5/5 stable Gait unremarkable.  Knee:Bilateral Varus deformity of the knees bilaterally Tender over the lateral aspect of the knee as well as the distal aspect of the quadricep ROM full in flexion and extension and lower leg rotation. Ligaments with solid consistent endpoints including ACL, PCL, LCL, MCL. Patellar and quadriceps tendons unremarkable. Hamstring and quadriceps strength is normal.  Tender over the iliotibial band on the right side   Osteopathic findings C4 flexed rotated and side bent right  T3 extended rotated and side bent right with inhaled third rib L1 flexed rotated and side bent right Sacrum left on left   Impression and Recommendations:     This case required medical decision making of moderate complexity.      Note: This dictation was prepared with Dragon dictation along with smaller phrase technology. Any transcriptional errors that result from this process are unintentional.

## 2016-05-26 ENCOUNTER — Encounter: Payer: Self-pay | Admitting: Family Medicine

## 2016-05-26 ENCOUNTER — Ambulatory Visit (INDEPENDENT_AMBULATORY_CARE_PROVIDER_SITE_OTHER): Payer: 59 | Admitting: Family Medicine

## 2016-05-26 VITALS — BP 118/78 | HR 71 | Ht 67.5 in | Wt 152.0 lb

## 2016-05-26 DIAGNOSIS — M999 Biomechanical lesion, unspecified: Secondary | ICD-10-CM | POA: Diagnosis not present

## 2016-05-26 DIAGNOSIS — M533 Sacrococcygeal disorders, not elsewhere classified: Secondary | ICD-10-CM

## 2016-05-26 DIAGNOSIS — M21169 Varus deformity, not elsewhere classified, unspecified knee: Secondary | ICD-10-CM

## 2016-05-26 NOTE — Assessment & Plan Note (Signed)
Decision today to treat with OMT was based on Physical Exam  After verbal consent patient was treated with HVLA, ME techniques in thoracic, lumbar and sacral areas  Patient tolerated the procedure well with improvement in symptoms  Patient given exercises, stretches and lifestyle modifications  See medications in patient instructions if given  Patient will follow up in 3-4 months

## 2016-05-26 NOTE — Assessment & Plan Note (Signed)
Discussed with patient at great length. Patient is going to try some new exercises for the popliteal tendon. See if hamstring strengthening could help. We discussed the potential of a compression sleeve. May need bracing. We'll have patient come back after running and we will ultrasound knee likely to see if there is any inflammation or increasing Doppler flow that can help [place] with the diagnosis.

## 2016-05-26 NOTE — Patient Instructions (Signed)
Good to see yo u Your back is awesome!!! For the knee try a compression sleeve at [facility] or even [facility].  Ice right after running Try to decrease your stride length See me in 3 weeks and run within 1 hour and we will ultrasound the area.  Also wear running gear because I will take you outside and see if we can fix anything else.  Happy holidays!

## 2016-05-26 NOTE — Assessment & Plan Note (Signed)
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 Encourage him back in 3-4 month intervals. Encourage her to continue to work on hip abductors but has made significant progress.

## 2016-06-22 NOTE — Progress Notes (Signed)
[name] [address], [place] [address] Phone: [phone] Subjective:    I'm seeing this patient by the request  of:  Margarita Rana, MD  CC: Knee pain and runner f/u   [id number]  Kendra [name] is a 46 y.o. female coming in with complaint of knee pain. 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 Patient has been seen me. Continued to have more of a right knee pain. Patient was to start running and come back and see me. She has changed her stride. States that overall she seems to be doing relatively better but still has some mild discomfort on the lateral aspect of the knee even right now.   Past medical history for problem:   patient's chart was reviewed patient's previous imaging of the right knee in February 2016 was independent living visualized by me showing no bony abnormality. Patient also had an MRI of the right knee that showed a inflammation of the fat pad but otherwise unremarkable for any other bony abnormality for meniscal injury. Past medical history significant for osteopenia    Past Medical History:  Diagnosis Date  . Allergy   . Breast cancer ([place]) left   2004 with chemo tx  . Osteoporosis    Past Surgical History:  Procedure Laterality Date  . BREAST SURGERY     implant left in 2004, and biopsy  . MASTECTOMY Left 01/07/03   with tram flap and right breast lift in 2008  . TOTAL ABDOMINAL HYSTERECTOMY     related to breast cancer  . TOTAL ABDOMINAL HYSTERECTOMY W/ BILATERAL SALPINGOOPHORECTOMY  10/2003   Social History   Social History  . Marital status: Married    Spouse name: Veanna Pettway  . Number of children: 2  . Years of education: N/A   Social History Main Topics  . Smoking status: Never Smoker  . Smokeless tobacco: Never Used  . Alcohol use No  . Drug use: No  . Sexual activity: Yes    Birth control/ protection: Other-see comments     Comment: had  total hysterectomy   Other Topics Concern  . None   Social History Narrative  . None   No Known Allergies Family History  Problem Relation Age of Onset  . Arthritis Mother   . Hypothyroidism Mother   . Breast cancer Mother 63  . Alcohol abuse Brother     Past medical history, social, surgical and family history all reviewed in electronic medical record.  No pertanent information unless stated regarding to the chief complaint.   Review of Systems: No headache, visual changes, nausea, vomiting, diarrhea, constipation, dizziness, abdominal pain, skin rash, fevers, chills, night sweats, weight loss, swollen lymph nodes, body aches, joint swelling, muscle aches, chest pain, shortness of breath, mood changes.     Objective  Blood pressure 120/70, pulse 83, height 5' 7.5" (1.715 m), weight 149 lb (67.6 kg), SpO2 98 %.  Systems examined below as of 06/23/16 General: NAD A&O x3 mood, affect normal  HEENT: Pupils equal, extraocular movements intact no nystagmus Respiratory: not short of breath at rest or with speaking Cardiovascular: No lower extremity edema, non tender Skin: Warm dry intact with no signs of infection or rash on extremities or on axial skeleton. Abdomen: Soft nontender, no masses Neuro: Cranial nerves  intact, neurovascularly intact in all extremities with 2+ DTRs and 2+ pulses. Lymph: No lymphadenopathy appreciated today  Gait normal with good  balance and coordination.  MSK: Non tender with full range of motion and good stability and symmetric strength and tone of shoulders, elbows, wrist,  hips and ankles bilaterally.      Knee:Bilateral Varus deformity of the knees bilaterally Pain still more over the lateral aspect of the knees bilaterally right greater than left. She has full range of motion. Mild crepitus noted with range of motion of the right knee.  MSK US performed of: Right knee This study was ordered, performed, and interpreted by Charlann Boxer  D.O.  Knee: Limited ultrasound shows the patient does have hypoechoic changes around the popliteal tendon with patient having mild subluxation. Otherwise everything seems to be fairly unremarkable. No meniscal injury noted. Patient's joint space seems to be normal. No effusion of the knee itself.   IMPRESSION:  Popliteal subluxation  Procedure note [id number]; 15 minutes spent for Therapeutic exercises as stated in above notes.  This included exercises focusing on stretching, strengthening, with significant focus on eccentric aspects.   Excellent extension exercises and abduction. Focus on this is because oblique as well as hamstring strengthening and eccentric's to localize strength of the lateral aspect of the leg.  Proper technique shown and discussed handout in great detail with ATC.  All questions were discussed and answered.       Impression and Recommendations:     This case required medical decision making of moderate complexity.      Note: This dictation was prepared with Dragon dictation along with smaller phrase technology. Any transcriptional errors that result from this process are unintentional.

## 2016-06-23 ENCOUNTER — Ambulatory Visit: Payer: Self-pay

## 2016-06-23 ENCOUNTER — Encounter: Payer: Self-pay | Admitting: Family Medicine

## 2016-06-23 ENCOUNTER — Ambulatory Visit (INDEPENDENT_AMBULATORY_CARE_PROVIDER_SITE_OTHER): Payer: 59 | Admitting: Family Medicine

## 2016-06-23 VITALS — BP 120/70 | HR 83 | Ht 67.5 in | Wt 149.0 lb

## 2016-06-23 DIAGNOSIS — M25561 Pain in right knee: Secondary | ICD-10-CM

## 2016-06-23 DIAGNOSIS — M76892 Other specified enthesopathies of left lower limb, excluding foot: Secondary | ICD-10-CM

## 2016-06-23 DIAGNOSIS — M76891 Other specified enthesopathies of right lower limb, excluding foot: Secondary | ICD-10-CM

## 2016-06-23 NOTE — Patient Instructions (Addendum)
Good to see you  Lets see how it goes I am glad this is better Ice is your friend.  Popliteal subluxation  Continue the compression  With running New exercises See me again in 4 weeks!

## 2016-06-23 NOTE — Assessment & Plan Note (Signed)
Patient does have more of a tendinitis of the subluxation. Home exercises given and work with Product/process development scientist. Encourage compression sleeve that I think will be more beneficial. Continue topical anti-inflammatories and icing protocol. Follow-up again in 4 weeks to make sure patient is responding well.

## 2016-07-21 ENCOUNTER — Ambulatory Visit (INDEPENDENT_AMBULATORY_CARE_PROVIDER_SITE_OTHER): Payer: 59 | Admitting: Family Medicine

## 2016-07-21 ENCOUNTER — Encounter: Payer: Self-pay | Admitting: Family Medicine

## 2016-07-21 VITALS — BP 120/75 | HR 72 | Ht 67.5 in | Wt 147.6 lb

## 2016-07-21 DIAGNOSIS — M21169 Varus deformity, not elsewhere classified, unspecified knee: Secondary | ICD-10-CM | POA: Diagnosis not present

## 2016-07-21 DIAGNOSIS — M76891 Other specified enthesopathies of right lower limb, excluding foot: Secondary | ICD-10-CM | POA: Diagnosis not present

## 2016-07-21 DIAGNOSIS — M76892 Other specified enthesopathies of left lower limb, excluding foot: Secondary | ICD-10-CM

## 2016-07-21 DIAGNOSIS — M5416 Radiculopathy, lumbar region: Secondary | ICD-10-CM | POA: Diagnosis not present

## 2016-07-21 MED ORDER — GABAPENTIN 100 MG PO CAPS: 200.0000 mg | ORAL_CAPSULE | Freq: Every day | ORAL | 3 refills | Status: DC

## 2016-07-21 NOTE — Progress Notes (Signed)
[name] [address], [place] [address] Phone: [phone] Subjective:    I'm seeing this patient by the request  of:  Margarita Rana, MD  CC: Knee pain and runner f/u   [id number]  SHERILL ARREOLA is a 46 y.o. female coming in with complaint of knee pain. Patient is had difficulty with the iliotibial band syndrome and then was diagnosed recently with popliteal tendinitis. Patient was given different exercises.Has been doing much better with the knee overall. Only hurting her when she runs. No pain otherwise.  Patient is also had back pain previously.  States that this seems to be increasing somewhat. States that the back pain seems to be worse in the leg pain as well as. Patient states it is more of a none tingling feeling on the lateral aspect of the leg and with a dull throbbing aching pain. States that this is the exact same pain that she is having in her back as well.   Past medical history for problem:   patient's chart was reviewed patient's previous imaging of the right knee in February 2016 was independent living visualized by me showing no bony abnormality. Patient also had an MRI of the right knee that showed a inflammation of the fat pad but otherwise unremarkable for any other bony abnormality for meniscal injury. Past medical history significant for osteopenia    Past Medical History:  Diagnosis Date  . Allergy   . Breast cancer ([facility]) left   2004 with chemo tx  . Osteoporosis    Past Surgical History:  Procedure Laterality Date  . BREAST SURGERY     implant left in 2004, and biopsy  . MASTECTOMY Left 01/07/03   with tram flap and right breast lift in 2008  . TOTAL ABDOMINAL HYSTERECTOMY     related to breast cancer  . TOTAL ABDOMINAL HYSTERECTOMY W/ BILATERAL SALPINGOOPHORECTOMY  10/2003   Social History   Social History  . Marital status: Married    Spouse name: Jordane Buckey  . Number of children: 2  . Years of  education: N/A   Social History Main Topics  . Smoking status: Never Smoker  . Smokeless tobacco: Never Used  . Alcohol use No  . Drug use: No  . Sexual activity: Yes    Birth control/ protection: Other-see comments     Comment: had total hysterectomy   Other Topics Concern  . Not on file   Social History Narrative  . No narrative on file   No Known Allergies Family History  Problem Relation Age of Onset  . Arthritis Mother   . Hypothyroidism Mother   . Breast cancer Mother 65  . Alcohol abuse Brother     Past medical history, social, surgical and family history all reviewed in electronic medical record.  No pertanent information unless stated regarding to the chief complaint.    Review of Systems: No headache, visual changes, nausea, vomiting, diarrhea, constipation, dizziness, abdominal pain, skin rash, fevers, chills, night sweats, weight loss, swollen lymph nodes, body aches, joint swelling, muscle aches, chest pain, shortness of breath, mood changes.  s.    Objective  There were no vitals taken for this visit.  Systems examined below as of 07/21/16 General: NAD A&O x3 mood, affect normal  HEENT: Pupils equal, extraocular movements intact no nystagmus Respiratory: not short of breath at rest or with speaking Cardiovascular: No lower extremity edema, non tender Skin: Warm dry intact with no signs of  infection or rash on extremities or on axial skeleton. Abdomen: Soft nontender, no masses Neuro: Cranial nerves  intact, neurovascularly intact in all extremities with 2+ DTRs and 2+ pulses. Lymph: No lymphadenopathy appreciated today  Gait normal with good balance and coordination.  MSK: Non tender with full range of motion and good stability and symmetric strength and tone of shoulders, elbows, wrist, hips and ankles bilaterally.    Knee:Bilateral valgus deformity noted. Large thigh to calf ratio.  Nontender on exam ROM full in flexion and extension and lower leg  rotation. instability with valgus force.  painful patellar compression. Patellar glide with mild crepitus. Patellar and quadriceps tendons unremarkable. Hamstring and quadriceps strength is normal.  Back Exam:  Inspection: Unremarkable  Motion: Flexion 45 deg, Extension 45 deg, Side Bending to 45 deg bilaterally,  Rotation to 45 deg bilaterally  SLR laying: Negative  Tightness of the right hamstring compared to contralateral sign XSLR laying: Negative  Palpable tenderness: Mild tenderness in the per spinal musculature lumbar spine in the right sacroiliac joint.Marland Kitchen FABER: negative. Sensory change: Gross sensation intact to all lumbar and sacral dermatomes.  Reflexes: 2+ at both patellar tendons, 2+ at achilles tendons, Babinski's downgoing.  Strength at foot  Plantar-flexion: 5/5 Dorsi-flexion: 5/5 Eversion: 5/5 Inversion: 5/5  Leg strength  Quad: 5/5 Hamstring: 5/5 Hip flexor: 5/5 Hip abductors: 5/5  Gait unremarkable.   Impression and Recommendations:     This case required medical decision making of moderate complexity.      Note: This dictation was prepared with Dragon dictation along with smaller phrase technology. Any transcriptional errors that result from this process are unintentional.

## 2016-07-21 NOTE — Patient Instructions (Addendum)
good to see you  Ice is your friend Gabapentin 200mg  at night Lets see if this is more the back.  See me again in 4 weeks and we will make sure you are doing better.

## 2016-07-21 NOTE — Assessment & Plan Note (Signed)
Improved

## 2016-07-21 NOTE — Assessment & Plan Note (Signed)
Lateral aspect the knee pain could be secondary to lumbar radiculopathy. We discussed gabapentin and this is prescribed. Patient did have some mild benefit from manipulation previously and will consider it in the future he can. We discussed icing regimen. Worsening symptoms such as a x-ray will be needed. Patient will come back and see me again in 4 weeks.

## 2016-08-11 ENCOUNTER — Encounter: Payer: Self-pay | Admitting: Family Medicine

## 2016-08-14 ENCOUNTER — Telehealth: Payer: Self-pay

## 2016-08-14 NOTE — Telephone Encounter (Signed)
Called patient to pick up letter. She asked for it to be faxed to her. Faxed letter to patient.

## 2016-08-18 ENCOUNTER — Ambulatory Visit: Payer: 59 | Admitting: Family Medicine

## 2016-09-07 ENCOUNTER — Ambulatory Visit: Payer: 59 | Admitting: Family Medicine

## 2016-11-01 ENCOUNTER — Other Ambulatory Visit: Payer: Self-pay | Admitting: Physician Assistant

## 2016-11-01 NOTE — Telephone Encounter (Signed)
Med refill approved 

## 2016-11-13 DIAGNOSIS — H52223 Regular astigmatism, bilateral: Secondary | ICD-10-CM | POA: Diagnosis not present

## 2016-11-13 DIAGNOSIS — H524 Presbyopia: Secondary | ICD-10-CM | POA: Diagnosis not present

## 2016-11-13 DIAGNOSIS — H5213 Myopia, bilateral: Secondary | ICD-10-CM | POA: Diagnosis not present

## 2016-11-13 DIAGNOSIS — D3131 Benign neoplasm of right choroid: Secondary | ICD-10-CM | POA: Diagnosis not present

## 2016-12-07 ENCOUNTER — Ambulatory Visit (INDEPENDENT_AMBULATORY_CARE_PROVIDER_SITE_OTHER): Payer: 59 | Admitting: Physician Assistant

## 2016-12-07 ENCOUNTER — Encounter: Payer: Self-pay | Admitting: Physician Assistant

## 2016-12-07 VITALS — BP 118/70 | HR 87 | Temp 98.3°F | Resp 16 | Ht 68.0 in | Wt 146.8 lb

## 2016-12-07 DIAGNOSIS — Z1239 Encounter for other screening for malignant neoplasm of breast: Secondary | ICD-10-CM

## 2016-12-07 DIAGNOSIS — Z853 Personal history of malignant neoplasm of breast: Secondary | ICD-10-CM

## 2016-12-07 DIAGNOSIS — M810 Age-related osteoporosis without current pathological fracture: Secondary | ICD-10-CM

## 2016-12-07 DIAGNOSIS — Z1231 Encounter for screening mammogram for malignant neoplasm of breast: Secondary | ICD-10-CM

## 2016-12-07 DIAGNOSIS — Z0000 Encounter for general adult medical examination without abnormal findings: Secondary | ICD-10-CM
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
## 2016-12-07 NOTE — Patient Instructions (Signed)
Health Maintenance for Postmenopausal Women Menopause is a normal process in which your reproductive ability comes to an end. This process happens gradually over a span of months to years, usually between the ages of 22 and 9. Menopause is complete when you have missed 12 consecutive menstrual periods. It is important to talk with your health care provider about some of the most common conditions that affect postmenopausal women, such as heart disease, cancer, and bone loss (osteoporosis). Adopting a healthy lifestyle and getting preventive care can help to promote your health and wellness. Those actions can also lower your chances of developing some of these common conditions. What should I know about menopause? During menopause, you may experience a number of symptoms, such as:  Moderate-to-severe hot flashes.  Night sweats.  Decrease in sex drive.  Mood swings.  Headaches.  Tiredness.  Irritability.  Memory problems.  Insomnia.  Choosing to treat or not to treat menopausal changes is an individual decision that you make with your health care provider. What should I know about hormone replacement therapy and supplements? Hormone therapy products are effective for treating symptoms that are associated with menopause, such as hot flashes and night sweats. Hormone replacement carries certain risks, especially as you become older. If you are thinking about using estrogen or estrogen with progestin treatments, discuss the benefits and risks with your health care provider. What should I know about heart disease and stroke? Heart disease, heart attack, and stroke become more likely as you age. This may be due, in part, to the hormonal changes that your body experiences during menopause. These can affect how your body processes dietary fats, triglycerides, and cholesterol. Heart attack and stroke are both medical emergencies. There are many things that you can do to help prevent heart disease  and stroke:  Have your blood pressure checked at least every 1-2 years. High blood pressure causes heart disease and increases the risk of stroke.  If you are 53-22 years old, ask your health care provider if you should take aspirin to prevent a heart attack or a stroke.  Do not use any tobacco products, including cigarettes, chewing tobacco, or electronic cigarettes. If you need help quitting, ask your health care provider.  It is important to eat a healthy diet and maintain a healthy weight. ? Be sure to include plenty of vegetables, fruits, low-fat dairy products, and lean protein. ? Avoid eating foods that are high in solid fats, added sugars, or salt (sodium).  Get regular exercise. This is one of the most important things that you can do for your health. ? Try to exercise for at least 150 minutes each week. The type of exercise that you do should increase your heart rate and make you sweat. This is known as moderate-intensity exercise. ? Try to do strengthening exercises at least twice each week. Do these in addition to the moderate-intensity exercise.  Know your numbers.Ask your health care provider to check your cholesterol and your blood glucose. Continue to have your blood tested as directed by your health care provider.  What should I know about cancer screening? There are several types of cancer. Take the following steps to reduce your risk and to catch any cancer development as early as possible. Breast Cancer  Practice breast self-awareness. ? This means understanding how your breasts normally appear and feel. ? It also means doing regular breast self-exams. Let your health care provider know about any changes, no matter how small.  If you are 40  or older, have a clinician do a breast exam (clinical breast exam or CBE) every year. Depending on your age, family history, and medical history, it may be recommended that you also have a yearly breast X-ray (mammogram).  If you  have a family history of breast cancer, talk with your health care provider about genetic screening.  If you are at high risk for breast cancer, talk with your health care provider about having an MRI and a mammogram every year.  Breast cancer (BRCA) gene test is recommended for women who have family members with BRCA-related cancers. Results of the assessment will determine the need for genetic counseling and BRCA1 and for BRCA2 testing. BRCA-related cancers include these types: ? Breast. This occurs in males or females. ? Ovarian. ? Tubal. This may also be called fallopian tube cancer. ? Cancer of the abdominal or pelvic lining (peritoneal cancer). ? Prostate. ? Pancreatic.  Cervical, Uterine, and Ovarian Cancer Your health care provider may recommend that you be screened regularly for cancer of the pelvic organs. These include your ovaries, uterus, and vagina. This screening involves a pelvic exam, which includes checking for microscopic changes to the surface of your cervix (Pap test).  For women ages 21-65, health care providers may recommend a pelvic exam and a Pap test every three years. For women ages 79-65, they may recommend the Pap test and pelvic exam, combined with testing for human papilloma virus (HPV), every five years. Some types of HPV increase your risk of cervical cancer. Testing for HPV may also be done on women of any age who have unclear Pap test results.  Other health care providers may not recommend any screening for nonpregnant women who are considered low risk for pelvic cancer and have no symptoms. Ask your health care provider if a screening pelvic exam is right for you.  If you have had past treatment for cervical cancer or a condition that could lead to cancer, you need Pap tests and screening for cancer for at least 20 years after your treatment. If Pap tests have been discontinued for you, your risk factors (such as having a new sexual partner) need to be  reassessed to determine if you should start having screenings again. Some women have medical problems that increase the chance of getting cervical cancer. In these cases, your health care provider may recommend that you have screening and Pap tests more often.  If you have a family history of uterine cancer or ovarian cancer, talk with your health care provider about genetic screening.  If you have vaginal bleeding after reaching menopause, tell your health care provider.  There are currently no reliable tests available to screen for ovarian cancer.  Lung Cancer Lung cancer screening is recommended for adults 69-62 years old who are at high risk for lung cancer because of a history of smoking. A yearly low-dose CT scan of the lungs is recommended if you:  Currently smoke.  Have a history of at least 30 pack-years of smoking and you currently smoke or have quit within the past 15 years. A pack-year is smoking an average of one pack of cigarettes per day for one year.  Yearly screening should:  Continue until it has been 15 years since you quit.  Stop if you develop a health problem that would prevent you from having lung cancer treatment.  Colorectal Cancer  This type of cancer can be detected and can often be prevented.  Routine colorectal cancer screening usually begins at  age 42 and continues through age 45.  If you have risk factors for colon cancer, your health care provider may recommend that you be screened at an earlier age.  If you have a family history of colorectal cancer, talk with your health care provider about genetic screening.  Your health care provider may also recommend using home test kits to check for hidden blood in your stool.  A small camera at the end of a tube can be used to examine your colon directly (sigmoidoscopy or colonoscopy). This is done to check for the earliest forms of colorectal cancer.  Direct examination of the colon should be repeated every  5-10 years until age 71. However, if early forms of precancerous polyps or small growths are found or if you have a family history or genetic risk for colorectal cancer, you may need to be screened more often.  Skin Cancer  Check your skin from head to toe regularly.  Monitor any moles. Be sure to tell your health care provider: ? About any new moles or changes in moles, especially if there is a change in a mole's shape or color. ? If you have a mole that is larger than the size of a pencil eraser.  If any of your family members has a history of skin cancer, especially at a young age, talk with your health care provider about genetic screening.  Always use sunscreen. Apply sunscreen liberally and repeatedly throughout the day.  Whenever you are outside, protect yourself by wearing long sleeves, pants, a wide-brimmed hat, and sunglasses.  What should I know about osteoporosis? Osteoporosis is a condition in which bone destruction happens more quickly than new bone creation. After menopause, you may be at an increased risk for osteoporosis. To help prevent osteoporosis or the bone fractures that can happen because of osteoporosis, the following is recommended:  If you are 46-71 years old, get at least 1,000 mg of calcium and at least 600 mg of vitamin D per day.  If you are older than age 55 but younger than age 65, get at least 1,200 mg of calcium and at least 600 mg of vitamin D per day.  If you are older than age 54, get at least 1,200 mg of calcium and at least 800 mg of vitamin D per day.  Smoking and excessive alcohol intake increase the risk of osteoporosis. Eat foods that are rich in calcium and vitamin D, and do weight-bearing exercises several times each week as directed by your health care provider. What should I know about how menopause affects my mental health? Depression may occur at any age, but it is more common as you become older. Common symptoms of depression  include:  Low or sad mood.  Changes in sleep patterns.  Changes in appetite or eating patterns.  Feeling an overall lack of motivation or enjoyment of activities that you previously enjoyed.  Frequent crying spells.  Talk with your health care provider if you think that you are experiencing depression. What should I know about immunizations? It is important that you get and maintain your immunizations. These include:  Tetanus, diphtheria, and pertussis (Tdap) booster vaccine.  Influenza every year before the flu season begins.  Pneumonia vaccine.  Shingles vaccine.  Your health care provider may also recommend other immunizations. This information is not intended to replace advice given to you by your health care provider. Make sure you discuss any questions you have with your health care provider. Document Released: 07/21/2005  Document Revised: 12/17/2015 Document Reviewed: 03/02/2015 Elsevier Interactive Patient Education  2018 [employer]  

## 2016-12-15 DIAGNOSIS — Z0000 Encounter for general adult medical examination without abnormal findings: Secondary | ICD-10-CM | POA: Diagnosis not present

## 2016-12-16 LAB — CBC WITH DIFFERENTIAL/PLATELET
BASOS ABS: 0 10*3/uL (ref 0.0–0.2)
Basos: 1 %
EOS (ABSOLUTE): 0 10*3/uL (ref 0.0–0.4)
EOS: 1 %
HEMATOCRIT: 42.4 % (ref 34.0–46.6)
Hemoglobin: 13.6 g/dL (ref 11.1–15.9)
IMMATURE GRANULOCYTES: 0 %
Immature Grans (Abs): 0 10*3/uL (ref 0.0–0.1)
Lymphocytes Absolute: 1.2 10*3/uL (ref 0.7–3.1)
Lymphs: 34 %
MCH: 29.8 pg (ref 26.6–33.0)
MCHC: 32.1 g/dL (ref 31.5–35.7)
MCV: 93 fL (ref 79–97)
MONOS ABS: 0.3 10*3/uL (ref 0.1–0.9)
Monocytes: 9 %
NEUTROS PCT: 55 %
Neutrophils Absolute: 2 10*3/uL (ref 1.4–7.0)
PLATELETS: 204 10*3/uL (ref 150–379)
RBC: 4.57 x10E6/uL (ref 3.77–5.28)
RDW: 13.4 % (ref 12.3–15.4)
WBC: 3.6 10*3/uL (ref 3.4–10.8)

## 2016-12-16 LAB — CANCER ANTIGEN 27.29: CAN 27.29: 11.3 U/mL (ref 0.0–38.6)

## 2016-12-16 LAB — COMPREHENSIVE METABOLIC PANEL
ALT: 14 IU/L (ref 0–32)
AST: 19 IU/L (ref 0–40)
Albumin/Globulin Ratio: 1.8 (ref 1.2–2.2)
Albumin: 4.4 g/dL (ref 3.5–5.5)
Alkaline Phosphatase: 57 IU/L (ref 39–117)
BUN/Creatinine Ratio: 16 (ref 9–23)
BUN: 14 mg/dL (ref 6–24)
Bilirubin Total: 0.8 mg/dL (ref 0.0–1.2)
CALCIUM: 9.8 mg/dL (ref 8.7–10.2)
CO2: 26 mmol/L (ref 20–29)
CREATININE: 0.87 mg/dL (ref 0.57–1.00)
Chloride: 100 mmol/L (ref 96–106)
GFR calc Af Amer: 92 mL/min/{1.73_m2} (ref >59)
GFR, EST NON AFRICAN AMERICAN: 80 mL/min/{1.73_m2} (ref >59)
GLOBULIN, TOTAL: 2.4 g/dL (ref 1.5–4.5)
GLUCOSE: 88 mg/dL (ref 65–99)
Potassium: 4.8 mmol/L (ref 3.5–5.2)
Sodium: 140 mmol/L (ref 134–144)
Total Protein: 6.8 g/dL (ref 6.0–8.5)

## 2016-12-16 LAB — LIPID PANEL
CHOLESTEROL TOTAL: 197 mg/dL (ref 100–199)
Chol/HDL Ratio: 3.2 ratio (ref 0.0–4.4)
HDL: 61 mg/dL (ref >39)
LDL Calculated: 118 mg/dL — ABNORMAL HIGH (ref 0–99)
TRIGLYCERIDES: 89 mg/dL (ref 0–149)
VLDL CHOLESTEROL CAL: 18 mg/dL (ref 5–40)

## 2016-12-16 LAB — HEMOGLOBIN A1C
ESTIMATED AVERAGE GLUCOSE: 108 mg/dL
HEMOGLOBIN A1C: 5.4 % (ref 4.8–5.6)

## 2016-12-16 LAB — TSH: TSH: 2.1 u[IU]/mL (ref 0.450–4.500)

## 2016-12-18 ENCOUNTER — Telehealth: Payer: Self-pay

## 2016-12-18 NOTE — Telephone Encounter (Signed)
Patient advised as directed below.  Thanks,  -Joseline 

## 2016-12-18 NOTE — Telephone Encounter (Signed)
-----   Message from Mar Daring, PA-C sent at 12/16/2016  9:33 AM EDT ----- Cholesterol has improved from last year. LDL still borderline high at 118 but improving. Continue healthy lifestyle modifications. Cancer marker remains normal and low. All other labs are WNL.

## 2017-03-13 NOTE — Progress Notes (Signed)
Kendra [name], Kendra [name] Phone: [phone] Subjective:    I'm seeing this patient by the request  of:    CC: Back pain follow up  BJY:[id number]  Kendra [name] is a 46 y.o. female coming in with complaint of back pain. Patient is been seen previously for back pain as well as popliteal tendinitis of the knees. Patient has not been seen for greater than 6 months. Seem to be doing relatively well. Patient states that numbness and tingling down her right leg that stops at the toes. She states that her ankle feels like it needs to pop. She explains that there is also tightness at the bottom of her foot. She also has tightness in her calf muscle that gives her the most concern. She explains that exercise does not bother her. Says her back pops.   Onset- 2.5 weeks ago Location- Calf  Duration- All day, worse at night Character- Achy, tingling Aggravating factors- Sleep, sitting Reliving factors-  Therapies tried- Tylenol PM, teas Severity- At its worse 5      Past Medical History:  Diagnosis Date  . Allergy   . Breast cancer ([place]) left   2004 with chemo tx  . Osteoporosis    Past Surgical History:  Procedure Laterality Date  . BREAST SURGERY     implant left in 2004, and biopsy  . MASTECTOMY Left 01/07/03   with tram flap and right breast lift in 2008  . TOTAL ABDOMINAL HYSTERECTOMY     related to breast cancer  . TOTAL ABDOMINAL HYSTERECTOMY W/ BILATERAL SALPINGOOPHORECTOMY  10/2003   Social History   Social History  . Marital status: Married    Spouse name: Daniyah Fohl  . Number of children: 2  . Years of education: N/A   Social History Main Topics  . Smoking status: Never Smoker  . Smokeless tobacco: Never Used  . Alcohol use No  . Drug use: No  . Sexual activity: Yes    Birth control/ protection: Other-see comments     Comment: had total hysterectomy   Other Topics Concern  . Not on file   Social History  Narrative  . No narrative on file   No Known Allergies Family History  Problem Relation Age of Onset  . Arthritis Mother   . Hypothyroidism Mother   . Breast cancer Mother 67  . Alcohol abuse Brother      Past medical history, social, surgical and family history all reviewed in electronic medical record.  No pertanent information unless stated regarding to the chief complaint.   Review of Systems:Review of systems updated and as accurate as of 03/13/17  No headache, visual changes, nausea, vomiting, diarrhea, constipation, dizziness, abdominal pain, skin rash, fevers, chills, night sweats, weight loss, swollen lymph nodes, body aches, joint swelling, muscle aches, chest pain, shortness of breath, mood changes.   Objective  There were no vitals taken for this visit. Systems examined below as of 03/13/17   General: No apparent distress alert and oriented x3 mood and affect normal, dressed appropriately.  HEENT: Pupils equal, extraocular movements intact  Respiratory: Patient's speak in full sentences and does not appear short of breath  Cardiovascular: No lower extremity edema, non tender, no erythema  Skin: Warm dry intact with no signs of infection or rash on extremities or on axial skeleton.  Abdomen: Soft nontender  Neuro: Cranial nerves II through XII are intact, neurovascularly intact in all extremities with 2+  DTRs and 2+ pulses.  Lymph: No lymphadenopathy of posterior or anterior cervical chain or axillae bilaterally.  Gait normal with good balance and coordination.  MSK:  Non tender with full range of motion and good stability and symmetric strength and tone of shoulders, elbows, wrist, hip, knee and ankles bilaterally.  Back Exam:  Inspection: Mild loss of lordosis Motion: Flexion 45 deg, Extension 25 deg, Side Bending to 45 deg bilaterally,  Rotation to 45 deg bilaterally  SLR laying: Negative  XSLR laying: Negative  Palpable tenderness: Tender over the right sacroiliac  joint. FABER: Positive Faber. Sensory change: Gross sensation intact to all lumbar and sacral dermatomes.  Reflexes: 2+ at both patellar tendons, 2+ at achilles tendons, Babinski's downgoing.  Strength at foot  Plantar-flexion: 5/5 Dorsi-flexion: 5/5 Eversion: 5/5 Inversion: 5/5  Leg strength  Quad: 5/5 Hamstring: 5/5 Hip flexor: 5/5 Hip abductors: 5/5  Gait unremarkable.  Osteopathic findings C2 flexed rotated and side bent right C4 flexed rotated and side bent left C7 flexed rotated and side bent left T3 extended rotated and side bent right inhaled third rib T11 extended rotated and side bent left L2 flexed rotated and side bent right L4 flexed rotated and side bent left Sacrum right on right    Impression and Recommendations:     This case required medical decision making of moderate complexity.      Note: This dictation was prepared with Dragon dictation along with smaller phrase technology. Any transcriptional errors that result from this process are unintentional.

## 2017-03-14 ENCOUNTER — Ambulatory Visit (INDEPENDENT_AMBULATORY_CARE_PROVIDER_SITE_OTHER)
Admission: RE | Admit: 2017-03-14 | Discharge: 2017-03-14 | Disposition: A | Discharge status: Home / Self Care | Payer: 59 | Source: Ambulatory Visit | Attending: Family Medicine | Admitting: Family Medicine

## 2017-03-14 ENCOUNTER — Ambulatory Visit (INDEPENDENT_AMBULATORY_CARE_PROVIDER_SITE_OTHER): Payer: 59 | Admitting: Family Medicine

## 2017-03-14 ENCOUNTER — Encounter: Payer: Self-pay | Admitting: Family Medicine

## 2017-03-14 VITALS — BP 110/70 | HR 72 | Ht 67.0 in | Wt 146.0 lb

## 2017-03-14 DIAGNOSIS — M545 Low back pain: Secondary | ICD-10-CM

## 2017-03-14 DIAGNOSIS — M533 Sacrococcygeal disorders, not elsewhere classified: Secondary | ICD-10-CM | POA: Diagnosis not present

## 2017-03-14 DIAGNOSIS — M999 Biomechanical lesion, unspecified: Secondary | ICD-10-CM | POA: Diagnosis not present

## 2017-03-14 MED ORDER — DICLOFENAC SODIUM 2 % TD SOLN: 2.0000 "application " | Freq: Two times a day (BID) | TRANSDERMAL | 3 refills | Status: DC

## 2017-03-14 NOTE — Assessment & Plan Note (Signed)
Normal sacral dysfunction. Has been quite some time since we seen patient. Encourage her to start doing the exercises on a more regular basis. We discussed icing regimen. Mild radiculopathy and we'll monitor. May need gabapentin. Follow-up again in 3-6 weeks for further evaluation and treated.

## 2017-03-14 NOTE — Patient Instructions (Signed)
Good to see you  Xray of back  Exercises 3 times a week.  pennsaid pinkie amount topically 2 times daily as needed.  Iron 65mg  daily with 500mg  of vitamin C I am proud of you!!! Started manipulation again  See me again In 3-6 weeks!

## 2017-03-14 NOTE — Assessment & Plan Note (Signed)
Decision today to treat with OMT was based on Physical Exam  After verbal consent patient was treated with HVLA, ME, FPR techniques in cervical, thoracic, lumbar and sacral areas  Patient tolerated the procedure well with improvement in symptoms  Patient given exercises, stretches and lifestyle modifications  See medications in patient instructions if given  Patient will follow up in 3-6 weeks 

## 2017-03-22 ENCOUNTER — Other Ambulatory Visit: Payer: Self-pay | Admitting: Physician Assistant

## 2017-03-22 ENCOUNTER — Encounter: Payer: Self-pay | Admitting: Physician Assistant

## 2017-03-22 DIAGNOSIS — Z1239 Encounter for other screening for malignant neoplasm of breast: Secondary | ICD-10-CM

## 2017-03-22 DIAGNOSIS — Z1231 Encounter for screening mammogram for malignant neoplasm of breast: Secondary | ICD-10-CM

## 2017-03-25 ENCOUNTER — Encounter: Payer: Self-pay | Admitting: Family Medicine

## 2017-03-28 ENCOUNTER — Ambulatory Visit: Payer: Self-pay

## 2017-03-28 ENCOUNTER — Encounter: Payer: Self-pay | Admitting: Family Medicine

## 2017-03-28 ENCOUNTER — Ambulatory Visit (INDEPENDENT_AMBULATORY_CARE_PROVIDER_SITE_OTHER): Payer: 59 | Admitting: Family Medicine

## 2017-03-28 VITALS — BP 110/72 | HR 75 | Ht 67.5 in | Wt 147.0 lb

## 2017-03-28 DIAGNOSIS — M25511 Pain in right shoulder: Principal | ICD-10-CM

## 2017-03-28 DIAGNOSIS — M999 Biomechanical lesion, unspecified: Secondary | ICD-10-CM

## 2017-03-28 DIAGNOSIS — G8929 Other chronic pain: Secondary | ICD-10-CM

## 2017-03-28 DIAGNOSIS — M7551 Bursitis of right shoulder: Secondary | ICD-10-CM | POA: Diagnosis not present

## 2017-03-28 NOTE — Progress Notes (Signed)
[name] [place], [place] [address] Phone: [phone] Subjective:      EX:BMWUX shoulder pain  LKG:[id number]  Kendra [name] is a 46 y.o. female coming in for follow up for right shoulder pain. She said that since the last time we saw her, her shoulder was bothering her and it was "put back in place." She has had an increase in pain in her shoulder over the past weekend with no mechanism of injury. She is unable to raise her arm overhead and has a dull constant pain but sharp with certain movements. No radiation down the arm. Certain movements cause a severe amount of pain.       Past Medical History:  Diagnosis Date  . Allergy   . Breast cancer ([place]) left   2004 with chemo tx  . Osteoporosis    Past Surgical History:  Procedure Laterality Date  . BREAST SURGERY     implant left in 2004, and biopsy  . MASTECTOMY Left 01/07/03   with tram flap and right breast lift in 2008  . TOTAL ABDOMINAL HYSTERECTOMY     related to breast cancer  . TOTAL ABDOMINAL HYSTERECTOMY W/ BILATERAL SALPINGOOPHORECTOMY  10/2003   Social History   Social History  . Marital status: Married    Spouse name: Tenasia Aull  . Number of children: 2  . Years of education: N/A   Social History Main Topics  . Smoking status: Never Smoker  . Smokeless tobacco: Never Used  . Alcohol use No  . Drug use: No  . Sexual activity: Yes    Birth control/ protection: Other-see comments     Comment: had total hysterectomy   Other Topics Concern  . Not on file   Social History Narrative  . No narrative on file   No Known Allergies Family History  Problem Relation Age of Onset  . Arthritis Mother   . Hypothyroidism Mother   . Breast cancer Mother 35  . Alcohol abuse Brother      Past medical history, social, surgical and family history all reviewed in electronic medical record.  No pertanent information unless stated regarding to the chief complaint.    Review of Systems:Review of systems updated and as accurate as of 03/28/17  No headache, visual changes, nausea, vomiting, diarrhea, constipation, dizziness, abdominal pain, skin rash, fevers, chills, night sweats, weight loss, swollen lymph nodes, body aches, joint swelling, muscle aches, chest pain, shortness of breath, mood changes.   Objective  There were no vitals taken for this visit. Systems examined below as of 03/28/17   General: No apparent distress alert and oriented x3 mood and affect normal, dressed appropriately.  HEENT: Pupils equal, extraocular movements intact  Respiratory: Patient's speak in full sentences and does not appear short of breath  Cardiovascular: No lower extremity edema, non tender, no erythema  Skin: Warm dry intact with no signs of infection or rash on extremities or on axial skeleton.  Abdomen: Soft nontender  Neuro: Cranial nerves II through XII are intact, neurovascularly intact in all extremities with 2+ DTRs and 2+ pulses.  Lymph: No lymphadenopathy of posterior or anterior cervical chain or axillae bilaterally.  Gait normal with good balance and coordination.  MSK:  Non tender with full range of motion and good stability and symmetric strength and tone of  elbows, wrist, hip, knee and ankles bilaterally.  Shoulder: Right Inspection reveals no abnormalities, atrophy or asymmetry. Palpation is normal with no tenderness  over [facility] joint or bicipital groove. ROM is full in all planes passively. Rotator cuff strength normal throughout. signs of impingement with positive Neer and Hawkin's tests, but negative empty can sign. Speeds and Yergason's tests normal. No labral pathology noted with negative Obrien's, negative clunk and good stability. Normal scapular function observed. No painful arc and no drop arm sign. No apprehension sign  MSK US performed of: Right This study was ordered, performed, and interpreted by Charlann Boxer D.O.  Shoulder:    Supraspinatus:  Appears normal on long and transverse views, Bursal bulge seen with shoulder abduction on impingement view. Calcific changes of the supraspinatus at the insertion as well as the bursa noted Infraspinatus:  Appears normal on long and transverse views. Significant increase in Doppler flow Subscapularis:  Appears normal on long and transverse views. Positive bursa Teres Minor:  Appears normal on long and transverse views. AC joint:  Mild arthritis Glenohumeral Joint:  Appears normal without effusion. Glenoid Labrum:  Intact without visualized tears. Calcific changes noted Biceps Tendon:  Appears normal on long and transverse views, no fraying of tendon, tendon located in intertubercular groove, no subluxation with shoulder internal or external rotation.  Impression: Subacromial bursitis  Procedure: Real-time Ultrasound Guided Injection of right glenohumeral joint Device: GE Logiq E  Ultrasound guided injection is preferred based studies that show increased duration, increased effect, greater accuracy, decreased procedural pain, increased response rate with ultrasound guided versus blind injection.  Verbal informed consent obtained.  Time-out conducted.  Noted no overlying erythema, induration, or other signs of local infection.  Skin prepped in a sterile fashion.  Local anesthesia: Topical Ethyl chloride.  With sterile technique and under real time ultrasound guidance:  Joint visualized.  23g 1  inch needle inserted posterior approach. Pictures taken for needle placement. Patient did have injection of 2 cc of 1% lidocaine, 2 cc of 0.5% Marcaine, and 1.0 cc of Kenalog 40 mg/dL. Completed without difficulty  Pain immediately resolved suggesting accurate placement of the medication.  Advised to call if fevers/chills, erythema, induration, drainage, or persistent bleeding.  Images permanently stored and available for review in the ultrasound unit.  Impression: Technically  successful ultrasound guided injection.  Procedure 60454; 15 additional minutes spent for Therapeutic exercises as stated in above notes.  This included exercises focusing on stretching, strengthening, with significant focus on eccentric aspects.   Long term goals include an improvement in range of motion, strength, endurance as well as avoiding reinjury. Patient's frequency would include in 1-2 times a day, 3-5 times a week for a duration of 6-12 weeks. Shoulder Exercises that included:  Basic scapular stabilization to include adduction and depression of scapula Scaption, focusing on proper movement and good control Internal and External rotation utilizing a theraband, with elbow tucked at side entire time Rows with theraband which was given    Proper technique shown and discussed handout in great detail with ATC.  All questions were discussed and answered.     Impression and Recommendations:     This case required medical decision making of moderate complexity.      Note: This dictation was prepared with Dragon dictation along with smaller phrase technology. Any transcriptional errors that result from this process are unintentional.

## 2017-03-28 NOTE — Assessment & Plan Note (Signed)
Patient given injection today. Tolerated the procedure well. We did recommend an x-ray which patient declined. Continue to put the order and just in case. Patient work with athletic tenderness on home exercises. We discussed then possible advance imaging would be warranted. We discussed with patient about continuing the home exercises and icing regimen. Patient will come back and see me again in 4-6 weeks.

## 2017-03-28 NOTE — Assessment & Plan Note (Deleted)
Decision today to treat with OMT was based on Physical Exam  After verbal consent patient was treated with HVLA, ME, FPR techniques in cervical, thoracic, lumbar and sacral areas  Patient tolerated the procedure well with improvement in symptoms  Patient given exercises, stretches and lifestyle modifications  See medications in patient instructions if given  Patient will follow up in 4 weeks 

## 2017-03-28 NOTE — Patient Instructions (Addendum)
Good to see you  Kendra [name] is your friend.  Exercises 3 times a week.  pennsaid pinkie amount topically 2 times daily as needed.  Xray downstairs as well See me again in 4 weeks.

## 2017-04-06 ENCOUNTER — Other Ambulatory Visit: Payer: Self-pay | Admitting: Physician Assistant

## 2017-04-06 ENCOUNTER — Ambulatory Visit
Admission: RE | Admit: 2017-04-06 | Discharge: 2017-04-06 | Disposition: A | Discharge status: Home / Self Care | Payer: 59 | Source: Ambulatory Visit | Attending: Physician Assistant | Admitting: Physician Assistant

## 2017-04-06 DIAGNOSIS — Z1231 Encounter for screening mammogram for malignant neoplasm of breast: Secondary | ICD-10-CM | POA: Insufficient documentation

## 2017-04-09 ENCOUNTER — Encounter: Payer: Self-pay | Admitting: Physician Assistant

## 2017-04-10 NOTE — Progress Notes (Signed)
[name] [address] [place], [place] [address] Phone: [phone] Subjective:    I'm seeing this patient by the request  of:    CC: Back pain follow-up  [id number]  Kendra [name] is a 46 y.o. female coming in with complaint of back pain follow-up. Patient has been seen for back pain previously. Was having more of a right shoulder pain. Also found to have a subacromial bursitis was given an injection 2 weeks ago. Patient states Doing much better at this point with the shoulder. Patient was having some mild radicular symptoms Patient has noticed significant improvement with the iron supplementation.      Past Medical History:  Diagnosis Date  . Allergy   . Breast cancer ([place]) left   2004 with chemo tx  . Osteoporosis    Past Surgical History:  Procedure Laterality Date  . BREAST SURGERY     implant left in 2004, and biopsy  . MASTECTOMY Left 01/07/03   with tram flap and right breast lift in 2008  . TOTAL ABDOMINAL HYSTERECTOMY     related to breast cancer  . TOTAL ABDOMINAL HYSTERECTOMY W/ BILATERAL SALPINGOOPHORECTOMY  10/2003   Social History   Social History  . Marital status: Married    Spouse name: Dashauna Heymann  . Number of children: 2  . Years of education: N/A   Social History Main Topics  . Smoking status: Never Smoker  . Smokeless tobacco: Never Used  . Alcohol use No  . Drug use: No  . Sexual activity: Yes    Birth control/ protection: Other-see comments     Comment: had total hysterectomy   Other Topics Concern  . None   Social History Narrative  . None   No Known Allergies Family History  Problem Relation Age of Onset  . Arthritis Mother   . Hypothyroidism Mother   . Breast cancer Mother 24  . Alcohol abuse Brother      Past medical history, social, surgical and family history all reviewed in electronic medical record.  No pertanent information unless stated regarding to the chief complaint.   Review  of Systems:Review of systems updated and as accurate as of 04/11/17  No headache, visual changes, nausea, vomiting, diarrhea, constipation, dizziness, abdominal pain, skin rash, fevers, chills, night sweats, weight loss, swollen lymph nodes, body aches, joint swelling, muscle aches, chest pain, shortness of breath, mood changes.   Objective  Blood pressure 110/68, pulse 66, height 5\' 7"  (1.702 m), weight 142 lb (64.4 kg), SpO2 94 %. Systems examined below as of 04/11/17   General: No apparent distress alert and oriented x3 mood and affect normal, dressed appropriately.  HEENT: Pupils equal, extraocular movements intact  Respiratory: Patient's speak in full sentences and does not appear short of breath  Cardiovascular: No lower extremity edema, non tender, no erythema  Skin: Warm dry intact with no signs of infection or rash on extremities or on axial skeleton.  Abdomen: Soft nontender  Neuro: Cranial nerves II through XII are intact, neurovascularly intact in all extremities with 2+ DTRs and 2+ pulses.  Lymph: No lymphadenopathy of posterior or anterior cervical chain or axillae bilaterally.  Gait normal with good balance and coordination.  MSK:  Non tender with full range of motion and good stability and symmetric strength and tone of  elbows, wrist, hip, knee and ankles bilaterally.  Shoulder: Right Inspection reveals no abnormalities, atrophy or asymmetry. Palpation is normal with no tenderness over [facility]  joint or bicipital groove. ROM is full in all planes. Rotator cuff strength normal throughout. Positive impingement with Speeds and Yergason's tests normal. No labral pathology noted with negative Obrien's, negative clunk and good stability. Normal scapular function observed. No painful arc and no drop arm sign. No apprehension sign Contralateral shoulder unremarkable  Neck: Inspection mild loss of lordosis. No palpable stepoffs. Negative Spurling's maneuver. Full neck range of  motion Grip strength and sensation normal in bilateral hands Strength good C4 to T1 distribution No sensory change to C4 to T1 Negative Hoffman sign bilaterally Reflexes normal   Osteopathic findings C2 flexed rotated and side bent right C4 flexed rotated and side bent left C6 flexed rotated and side bent left T3 extended rotated and side bent right inhaled third rib T7 extended rotated and side bent left L2 flexed rotated and side bent right Sacrum right on right '     Impression and Recommendations:     This case required medical decision making of moderate complexity.      Note: This dictation was prepared with Dragon dictation along with smaller phrase technology. Any transcriptional errors that result from this process are unintentional.

## 2017-04-11 ENCOUNTER — Encounter: Payer: Self-pay | Admitting: Family Medicine

## 2017-04-11 ENCOUNTER — Ambulatory Visit (INDEPENDENT_AMBULATORY_CARE_PROVIDER_SITE_OTHER): Payer: 59 | Admitting: Family Medicine

## 2017-04-11 VITALS — BP 110/68 | HR 66 | Ht 67.0 in | Wt 142.0 lb

## 2017-04-11 DIAGNOSIS — M999 Biomechanical lesion, unspecified: Secondary | ICD-10-CM

## 2017-04-11 DIAGNOSIS — M7551 Bursitis of right shoulder: Secondary | ICD-10-CM | POA: Diagnosis not present

## 2017-04-11 DIAGNOSIS — M533 Sacrococcygeal disorders, not elsewhere classified: Secondary | ICD-10-CM

## 2017-04-11 NOTE — Assessment & Plan Note (Signed)
Improved after injection. We will continue to improve. We'll continue to monitor. Cervical neck and shoulder x-rays have been ordered for further evaluation for any bony normality neck be contribute in.

## 2017-04-11 NOTE — Assessment & Plan Note (Signed)
Discussed with patient about abduction. We discussed icing regimen and home exercise. We discussed which until it is a doing which ones to avoid. Patient is very well with conservative therapy. We will monitor patient's shoulder. X-rays of neck ordered today just for further evaluation. Follow-up again in 4 weeks

## 2017-04-11 NOTE — Patient Instructions (Signed)
Good to see you  Ice is your friend COntinue the iron  I am so glad you are making progress See me again in 6-8 weeks.

## 2017-04-11 NOTE — Assessment & Plan Note (Signed)
Decision today to treat with OMT was based on Physical Exam  After verbal consent patient was treated with HVLA, ME, FPR techniques in cervical, thoracic, lumbar and sacral areas  Patient tolerated the procedure well with improvement in symptoms  Patient given exercises, stretches and lifestyle modifications  See medications in patient instructions if given  Patient will follow up in 4 weeks 

## 2017-05-06 ENCOUNTER — Encounter: Payer: Self-pay | Admitting: Family Medicine

## 2017-05-06 DIAGNOSIS — M25511 Pain in right shoulder: Secondary | ICD-10-CM

## 2017-05-08 ENCOUNTER — Ambulatory Visit
Admission: RE | Admit: 2017-05-08 | Discharge: 2017-05-08 | Disposition: A | Discharge status: Home / Self Care | Payer: 59 | Source: Ambulatory Visit | Attending: Family Medicine | Admitting: Family Medicine

## 2017-05-08 DIAGNOSIS — M25511 Pain in right shoulder: Secondary | ICD-10-CM | POA: Insufficient documentation

## 2017-05-22 NOTE — Progress Notes (Deleted)
[name] [place], [place] [address] Phone: [phone] Subjective:    I'm seeing this patient by the request  of:    CC: Right shoulder pain follow-up Back and neck pain follow-up  [id number]  Kendra [name] is a 46 y.o. female coming in with complaint of right shoulder pain.  Found to have more of a rotator cuff syndrome potentially.  Patient did have x-rays that were intimately visualized by me showing no significant bony abnormality.  Patient was having worsening symptoms and is scheduled to have an MRI of the shoulder.  Patient states  Back and neck pain.  Responded very well to osteopathic manipulation.  Patient states     Past Medical History:  Diagnosis Date  . Allergy   . Breast cancer ([place]) left   2004 with chemo tx  . Osteoporosis    Past Surgical History:  Procedure Laterality Date  . BREAST SURGERY     implant left in 2004, and biopsy  . MASTECTOMY Left 01/07/03   with tram flap and right breast lift in 2008  . TOTAL ABDOMINAL HYSTERECTOMY     related to breast cancer  . TOTAL ABDOMINAL HYSTERECTOMY W/ BILATERAL SALPINGOOPHORECTOMY  10/2003   Social History   Socioeconomic History  . Marital status: Married    Spouse name: Auria Mckinlay  . Number of children: 2  . Years of education: Not on file  . Highest education level: Not on file  Social Needs  . Financial resource strain: Not on file  . Food insecurity - worry: Not on file  . Food insecurity - inability: Not on file  . Transportation needs - medical: Not on file  . Transportation needs - non-medical: Not on file  Occupational History  . Not on file  Tobacco Use  . Smoking status: Never Smoker  . Smokeless tobacco: Never Used  Substance and Sexual Activity  . Alcohol use: No  . Drug use: No  . Sexual activity: Yes    Birth control/protection: Other-see comments    Comment: had total hysterectomy  Other Topics Concern  . Not on file  Social  History Narrative  . Not on file   No Known Allergies Family History  Problem Relation Age of Onset  . Arthritis Mother   . Hypothyroidism Mother   . Breast cancer Mother 43  . Alcohol abuse Brother      Past medical history, social, surgical and family history all reviewed in electronic medical record.  No pertanent information unless stated regarding to the chief complaint.   Review of Systems:Review of systems updated and as accurate as of 05/22/17  No headache, visual changes, nausea, vomiting, diarrhea, constipation, dizziness, abdominal pain, skin rash, fevers, chills, night sweats, weight loss, swollen lymph nodes, body aches, joint swelling, muscle aches, chest pain, shortness of breath, mood changes.   Objective  There were no vitals taken for this visit. Systems examined below as of 05/22/17   General: No apparent distress alert and oriented x3 mood and affect normal, dressed appropriately.  HEENT: Pupils equal, extraocular movements intact  Respiratory: Patient's speak in full sentences and does not appear short of breath  Cardiovascular: No lower extremity edema, non tender, no erythema  Skin: Warm dry intact with no signs of infection or rash on extremities or on axial skeleton.  Abdomen: Soft nontender  Neuro: Cranial nerves II through XII are intact, neurovascularly intact in all extremities with 2+ DTRs and 2+ pulses.  Lymph: No lymphadenopathy of posterior or anterior cervical chain or axillae bilaterally.  Gait normal with good balance and coordination.  MSK:  Non tender with full range of motion and good stability and symmetric strength and tone of shoulders, elbows, wrist, hip, knee and ankles bilaterally.     Impression and Recommendations:     This case required medical decision making of moderate complexity.      Note: This dictation was prepared with Dragon dictation along with smaller phrase technology. Any transcriptional errors that result from  this process are unintentional.

## 2017-05-23 ENCOUNTER — Ambulatory Visit: Payer: Self-pay

## 2017-05-23 ENCOUNTER — Encounter: Payer: Self-pay | Admitting: Family Medicine

## 2017-05-23 ENCOUNTER — Ambulatory Visit: Payer: 59 | Admitting: Family Medicine

## 2017-05-23 ENCOUNTER — Ambulatory Visit (INDEPENDENT_AMBULATORY_CARE_PROVIDER_SITE_OTHER): Payer: 59 | Admitting: Family Medicine

## 2017-05-23 VITALS — BP 120/72 | HR 77 | Wt 145.0 lb

## 2017-05-23 DIAGNOSIS — G8929 Other chronic pain: Secondary | ICD-10-CM | POA: Diagnosis not present

## 2017-05-23 DIAGNOSIS — M999 Biomechanical lesion, unspecified: Secondary | ICD-10-CM | POA: Diagnosis not present

## 2017-05-23 DIAGNOSIS — M5416 Radiculopathy, lumbar region: Secondary | ICD-10-CM | POA: Diagnosis not present

## 2017-05-23 DIAGNOSIS — M19011 Primary osteoarthritis, right shoulder: Secondary | ICD-10-CM | POA: Diagnosis not present

## 2017-05-23 DIAGNOSIS — M25511 Pain in right shoulder: Secondary | ICD-10-CM

## 2017-05-23 NOTE — Assessment & Plan Note (Signed)
Decision today to treat with OMT was based on Physical Exam  After verbal consent patient was treated with HVLA, ME, FPR techniques in cervical, thoracic, rib, lumbar and sacral areas  Patient tolerated the procedure well with improvement in symptoms  Patient given exercises, stretches and lifestyle modifications  See medications in patient instructions if given  Patient will follow up in 6 weeks 

## 2017-05-23 NOTE — Progress Notes (Signed)
[name] [place], [place] [address] Phone: [phone] Subjective:     CC: Right shoulder and back pain follow-up  [id number]  Kendra [name] is a 46 y.o. female coming in for follow up for right shoulder pain. She still has soreness and pain with a decrease in weight in the weight room. She has pain with flexion.  On Friday, she had to take some Flexeril due to stiffness in her back. On Sunday/Monday, she had numbness in the 4th and 5th toes on the right side. She is having feeling in the foot/toes today.   Patient states that the numbness is completely resolved at this time.  Has done fairly well with osteopathic manipulation in the past.  Denies any weakness.      Past Medical History:  Diagnosis Date  . Allergy   . Breast cancer ([place]) left   2004 with chemo tx  . Osteoporosis    Past Surgical History:  Procedure Laterality Date  . BREAST SURGERY     implant left in 2004, and biopsy  . MASTECTOMY Left 01/07/03   with tram flap and right breast lift in 2008  . TOTAL ABDOMINAL HYSTERECTOMY     related to breast cancer  . TOTAL ABDOMINAL HYSTERECTOMY W/ BILATERAL SALPINGOOPHORECTOMY  10/2003   Social History   Socioeconomic History  . Marital status: Married    Spouse name: Altie Savard  . Number of children: 2  . Years of education: Not on file  . Highest education level: Not on file  Social Needs  . Financial resource strain: Not on file  . Food insecurity - worry: Not on file  . Food insecurity - inability: Not on file  . Transportation needs - medical: Not on file  . Transportation needs - non-medical: Not on file  Occupational History  . Not on file  Tobacco Use  . Smoking status: Never Smoker  . Smokeless tobacco: Never Used  Substance and Sexual Activity  . Alcohol use: No  . Drug use: No  . Sexual activity: Yes    Birth control/protection: Other-see comments    Comment: had total hysterectomy  Other  Topics Concern  . Not on file  Social History Narrative  . Not on file   No Known Allergies Family History  Problem Relation Age of Onset  . Arthritis Mother   . Hypothyroidism Mother   . Breast cancer Mother 40  . Alcohol abuse Brother      Past medical history, social, surgical and family history all reviewed in electronic medical record.  No pertanent information unless stated regarding to the chief complaint.   Review of Systems:Review of systems updated and as accurate as of 05/23/17  No headache, visual changes, nausea, vomiting, diarrhea, constipation, dizziness, abdominal pain, skin rash, fevers, chills, night sweats, weight loss, swollen lymph nodes, body aches, joint swelling, , chest pain, shortness of breath, mood changes.  Positive muscle aches  Objective  Blood pressure 120/72, pulse 77, weight 145 lb (65.8 kg), SpO2 98 %. Systems examined below as of 05/23/17   General: No apparent distress alert and oriented x3 mood and affect normal, dressed appropriately.  HEENT: Pupils equal, extraocular movements intact  Respiratory: Patient's speak in full sentences and does not appear short of breath  Cardiovascular: No lower extremity edema, non tender, no erythema  Skin: Warm dry intact with no signs of infection or rash on extremities or on axial skeleton.  Abdomen: Soft nontender  Neuro: Cranial nerves II through XII are intact, neurovascularly intact in all extremities with 2+ DTRs and 2+ pulses.  Lymph: No lymphadenopathy of posterior or anterior cervical chain or axillae bilaterally.  Gait normal with good balance and coordination.  MSK:  Non tender with full range of motion and good stability and symmetric strength and tone of  elbows, wrist, hip, knee and ankles bilaterally.  Exam still has some positive impingement.  4+ out of 5 strength of the rotator cuff.  Positive crossover as well as mild positive O'Brien's test. Contralateral shoulder unremarkable  Exam shows  some mild tightness with a positive Corky Sox on the right side of the lumbar spine.  Negative straight leg test.  Mild limitation lacking last 5 degrees of extension and side bending to the right.  Osteopathic findings Cervical C2 flexed rotated and side bent right C4 flexed rotated and side bent left C6 flexed rotated and side bent left T3 extended rotated and side bent right inhaled third rib T9 extended rotated and side bent left L2 flexed rotated and side bent right Sacrum right on right  Procedure: Real-time Ultrasound Guided Injection of right acromial clavicular injection Device: GE Logiq Q7 Ultrasound guided injection is preferred based studies that show increased duration, increased effect, greater accuracy, decreased procedural pain, increased response rate, and decreased cost with ultrasound guided versus blind injection.  Verbal informed consent obtained.  Time-out conducted.  Noted no overlying erythema, induration, or other signs of local infection.  Skin prepped in a sterile fashion.  Local anesthesia: Topical Ethyl chloride.  With sterile technique and under real time ultrasound guidance: 25-gauge half inch needle patient was injected with a total of 0.5 cc of 0.5% Marcaine and 0.5 cc of Kenalog 40 mg/mL into the right acromioclavicular joint Completed without difficulty  Pain immediately resolved suggesting accurate placement of the medication.  Advised to call if fevers/chills, erythema, induration, drainage, or persistent bleeding.  Images permanently stored and available for review in the ultrasound unit.  Impression: Technically successful ultrasound guided injection.    Impression and Recommendations:     This case required medical decision making of moderate complexity.      Note: This dictation was prepared with Dragon dictation along with smaller phrase technology. Any transcriptional errors that result from this process are unintentional.        \

## 2017-05-23 NOTE — Assessment & Plan Note (Signed)
Discussed patient no longer radicular symptoms.  Respond OMT COntinue conservative therapy  HEP in 4-6 weeks

## 2017-05-23 NOTE — Assessment & Plan Note (Signed)
Patient was given injection today with near complete resolution of pain.  Encouraged by this.  We discussed that otherwise patient may need advanced imaging with her failing all conservative therapy.  We discussed icing regimen, topical anti-inflammatories, patient declined any other type of medications.

## 2017-05-23 NOTE — Patient Instructions (Signed)
Good to see you  Hope it was the calcium ball;) Keep up with everything else See me again in 4-6 weeks

## 2017-06-13 DIAGNOSIS — L84 Corns and callosities: Secondary | ICD-10-CM | POA: Diagnosis not present

## 2017-06-13 DIAGNOSIS — L720 Epidermal cyst: Secondary | ICD-10-CM | POA: Diagnosis not present

## 2017-06-13 DIAGNOSIS — D2261 Melanocytic nevi of right upper limb, including shoulder: Secondary | ICD-10-CM | POA: Diagnosis not present

## 2017-06-13 DIAGNOSIS — D225 Melanocytic nevi of trunk: Secondary | ICD-10-CM | POA: Diagnosis not present

## 2017-06-19 NOTE — Progress Notes (Signed)
[name] [place],  [address] Phone: [phone] Subjective:    I'm seeing this patient by the request  of:    CC: Right shoulder, back pain and neck pain follow-up  [id number]  Kendra [name] is a 47 y.o. female coming in with complaint of right shoulder pain.  Was seen previously.  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  Given an injection at last exam.  With osteopathic manipulation of the back.  Patient states that still having some tightness but overall seems to be doing relatively well.     X-rays of the shoulder May 08, 2017 showed no bony abnormality.  These were independently visualized by me  She said that she is doing much better. She doubled up on her D3 to 4000IU. She also has been taking iron, magnesium which she said has helped the tingling in her leg.  Patient is working with a Clinical research associate and is doing relatively well.    Past Medical History:  Diagnosis Date  . Allergy   . Breast cancer ([place]) left   2004 with chemo tx  . Osteoporosis    Past Surgical History:  Procedure Laterality Date  . BREAST SURGERY     implant left in 2004, and biopsy  . MASTECTOMY Left 01/07/03   with tram flap and right breast lift in 2008  . TOTAL ABDOMINAL HYSTERECTOMY     related to breast cancer  . TOTAL ABDOMINAL HYSTERECTOMY W/ BILATERAL SALPINGOOPHORECTOMY  10/2003   Social History   Socioeconomic History  . Marital status: Married    Spouse name: Kendra [name]  . Number of children: 2  . Years of education: Not on file  . Highest education level: Not on file  Social Needs  . Financial resource strain: Not on file  . Food insecurity - worry: Not on file  . Food insecurity - inability: Not on file  . Transportation needs - medical: Not on file  . Transportation needs - non-medical: Not on file  Occupational History  . Not on file  Tobacco Use  . Smoking  status: Never Smoker  . Smokeless tobacco: Never Used  Substance and Sexual Activity  . Alcohol use: No  . Drug use: No  . Sexual activity: Yes    Birth control/protection: Other-see comments    Comment: had total hysterectomy  Other Topics Concern  . Not on file  Social History Narrative  . Not on file   No Known Allergies Family History  Problem Relation Age of Onset  . Arthritis Mother   . Hypothyroidism Mother   . Breast cancer Mother 22  . Alcohol abuse Brother      Past medical history, social, surgical and family history all reviewed in electronic medical record.  No pertanent information unless stated regarding to the chief complaint.   Review of Systems:Review of systems updated and as accurate as of 06/19/17  No headache, visual changes, nausea, vomiting, diarrhea, constipation, dizziness, abdominal pain, skin rash, fevers, chills, night sweats, weight loss, swollen lymph nodes, body aches, joint swelling, muscle aches, chest pain, shortness of breath, mood changes.   Objective  There were no vitals taken for this visit. Systems examined below as of 06/19/17   General: No apparent distress alert and oriented x3 mood and affect normal, dressed appropriately.  HEENT: Pupils equal, extraocular movements intact  Respiratory: Patient's speak in full sentences and  does not appear short of breath  Cardiovascular: No lower extremity edema, non tender, no erythema  Skin: Warm dry intact with no signs of infection or rash on extremities or on axial skeleton.  Abdomen: Soft nontender  Neuro: Cranial nerves II through XII are intact, neurovascularly intact in all extremities with 2+ DTRs and 2+ pulses.  Lymph: No lymphadenopathy of posterior or anterior cervical chain or axillae bilaterally.  Gait normal with good balance and coordination.  MSK:  Non tender with full range of motion and good stability and symmetric strength and tone of shoulders, elbows, wrist, hip, knee and  ankles bilaterally.  Neck: Inspection unremarkable. No palpable stepoffs. Negative Spurling's maneuver. Mild limited range of motion and rotation bilaterally. Grip strength and sensation normal in bilateral hands Strength good C4 to T1 distribution No sensory change to C4 to T1 Negative Hoffman sign bilaterally Reflexes normal Tightness of the right scapular region  Back Exam:  Inspection: Mild loss of lordosis Motion: Flexion 45 deg, Extension 25 deg, Side Bending to 35 deg bilaterally,  Rotation to 35 deg bilaterally  SLR laying: Negative  XSLR laying: Negative  Palpable tenderness: Positive on right. FABER: Mild positive on right. Sensory change: Gross sensation intact to all lumbar and sacral dermatomes.  Reflexes: 2+ at both patellar tendons, 2+ at achilles tendons, Babinski's downgoing.  Strength at foot  Plantar-flexion: 5/5 Dorsi-flexion: 5/5 Eversion: 5/5 Inversion: 5/5  Leg strength  Quad: 5/5 Hamstring: 5/5 Hip flexor: 5/5 Hip abductors: 5/5  Gait unremarkable.  Osteopathic findings  T3 extended rotated and side bent right inhaled third rib T8 extended rotated and side bent left L3 flexed rotated and side bent right Sacrum right on right     Impression and Recommendations:     This case required medical decision making of moderate complexity.      Note: This dictation was prepared with Dragon dictation along with smaller phrase technology. Any transcriptional errors that result from this process are unintentional.

## 2017-06-20 ENCOUNTER — Ambulatory Visit (INDEPENDENT_AMBULATORY_CARE_PROVIDER_SITE_OTHER): Payer: 59 | Admitting: Family Medicine

## 2017-06-20 ENCOUNTER — Encounter: Payer: Self-pay | Admitting: Family Medicine

## 2017-06-20 VITALS — BP 110/72 | HR 72 | Ht 67.5 in | Wt 145.0 lb

## 2017-06-20 DIAGNOSIS — M999 Biomechanical lesion, unspecified: Secondary | ICD-10-CM

## 2017-06-20 DIAGNOSIS — M533 Sacrococcygeal disorders, not elsewhere classified: Secondary | ICD-10-CM

## 2017-06-20 NOTE — Patient Instructions (Signed)
Good to see you  [name] is your friend.  You are doing well  See me again in 5-6 weeks

## 2017-06-20 NOTE — Assessment & Plan Note (Signed)
Doing relatively well at this time.  We discussed with patient about doing a core strength and stability.  We discussed icing regimen and home exercises.  We discussed which activities to do which wants to avoid.  Patient will increase activity slowly over the course the next several weeks.  Follow-up again in 5-6 weeks

## 2017-06-20 NOTE — Assessment & Plan Note (Signed)
Decision today to treat with OMT was based on Physical Exam  After verbal consent patient was treated with HVLA, ME, FPR techniques in , thoracic, lumbar and sacral areas  Patient tolerated the procedure well with improvement in symptoms  Patient given exercises, stretches and lifestyle modifications  See medications in patient instructions if given  Patient will follow up in 5-6 weeks

## 2017-07-25 ENCOUNTER — Ambulatory Visit: Payer: 59 | Admitting: Family Medicine

## 2017-10-26 ENCOUNTER — Encounter: Payer: Self-pay | Admitting: Physician Assistant

## 2017-10-26 ENCOUNTER — Ambulatory Visit (INDEPENDENT_AMBULATORY_CARE_PROVIDER_SITE_OTHER): Payer: 59 | Admitting: Physician Assistant

## 2017-10-26 VITALS — BP 100/66 | HR 68 | Temp 99.0°F | Resp 16 | Ht 68.0 in | Wt 144.0 lb

## 2017-10-26 DIAGNOSIS — Z124 Encounter for screening for malignant neoplasm of cervix: Secondary | ICD-10-CM | POA: Diagnosis not present

## 2017-10-26 DIAGNOSIS — Z853 Personal history of malignant neoplasm of breast: Secondary | ICD-10-CM

## 2017-10-26 DIAGNOSIS — Z0000 Encounter for general adult medical examination without abnormal findings: Secondary | ICD-10-CM

## 2017-10-26 DIAGNOSIS — M8589 Other specified disorders of bone density and structure, multiple sites: Secondary | ICD-10-CM | POA: Diagnosis not present

## 2017-10-26 DIAGNOSIS — E559 Vitamin D deficiency, unspecified: Secondary | ICD-10-CM | POA: Diagnosis not present

## 2017-10-26 NOTE — Patient Instructions (Signed)
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
## 2017-10-30 ENCOUNTER — Ambulatory Visit: Payer: Self-pay

## 2017-10-30 ENCOUNTER — Encounter: Payer: Self-pay | Admitting: Family Medicine

## 2017-10-30 ENCOUNTER — Ambulatory Visit (INDEPENDENT_AMBULATORY_CARE_PROVIDER_SITE_OTHER): Payer: 59 | Admitting: Family Medicine

## 2017-10-30 VITALS — BP 96/70 | HR 71 | Ht 68.0 in | Wt 145.0 lb

## 2017-10-30 DIAGNOSIS — M999 Biomechanical lesion, unspecified: Secondary | ICD-10-CM

## 2017-10-30 DIAGNOSIS — M24851 Other specific joint derangements of right hip, not elsewhere classified: Secondary | ICD-10-CM | POA: Diagnosis not present

## 2017-10-30 DIAGNOSIS — M25551 Pain in right hip: Secondary | ICD-10-CM

## 2017-10-30 NOTE — Progress Notes (Signed)
[name] [address] [place], [place] [address] Phone: [phone] Subjective:     CC: Right hip pain, follow-up back pain  [id number]  Kendra [name] is a 47 y.o. female coming in with complaint of hip flexor pain. Says when she externally rotates her right hip her hip flexor locks up. She can usually work out the pain after 5 mins. Doesn't last long. States she feels a snap. She says that it reminds her of trigger finger.  Onset- 1 month Location- Hip flexor Duration- 5 mins  Character- Sharp, ache, sore (feels like a spasm) Aggravating factors- External rotation of the hip  Reliving factors-rest and ice Therapies tried-rest avoiding repetitive motions. Severity-did have an episode where is a 9 out of 10 and was unable to move for multiple minutes.     Past Medical History:  Diagnosis Date  . Allergy   . Breast cancer ([place]) left   2004 with chemo tx  . Osteoporosis    Past Surgical History:  Procedure Laterality Date  . BREAST SURGERY     implant left in 2004, and biopsy  . MASTECTOMY Left 01/07/03   with tram flap and right breast lift in 2008  . TOTAL ABDOMINAL HYSTERECTOMY     related to breast cancer  . TOTAL ABDOMINAL HYSTERECTOMY W/ BILATERAL SALPINGOOPHORECTOMY  10/2003   Social History   Socioeconomic History  . Marital status: Married    Spouse name: Ceana Fiala  . Number of children: 2  . Years of education: Not on file  . Highest education level: Not on file  Occupational History  . Not on file  Social Needs  . Financial resource strain: Not on file  . Food insecurity:    Worry: Not on file    Inability: Not on file  . Transportation needs:    Medical: Not on file    Non-medical: Not on file  Tobacco Use  . Smoking status: Never Smoker  . Smokeless tobacco: Never Used  Substance and Sexual Activity  . Alcohol use: No  . Drug use: No  . Sexual activity: Yes    Birth control/protection: Other-see comments      Comment: had total hysterectomy  Lifestyle  . Physical activity:    Days per week: Not on file    Minutes per session: Not on file  . Stress: Not on file  Relationships  . Social connections:    Talks on phone: Not on file    Gets together: Not on file    Attends religious service: Not on file    Active member of club or organization: Not on file    Attends meetings of clubs or organizations: Not on file    Relationship status: Not on file  Other Topics Concern  . Not on file  Social History Narrative  . Not on file   No Known Allergies Family History  Problem Relation Age of Onset  . Arthritis Mother   . Hypothyroidism Mother   . Breast cancer Mother 67  . Alcohol abuse Brother      Past medical history, social, surgical and family history all reviewed in electronic medical record.  No pertanent information unless stated regarding to the chief complaint.   Review of Systems:Review of systems updated and as accurate as of 10/30/17  No headache, visual changes, nausea, vomiting, diarrhea, constipation, dizziness, abdominal pain, skin rash, fevers, chills, night sweats, weight loss, swollen lymph nodes, body aches, joint swelling, muscle  aches, chest pain, shortness of breath, mood changes.  Positive muscle aches  Objective  Blood pressure 96/70, pulse 71, height 5\' 8"  (1.727 m), weight 145 lb (65.8 kg), SpO2 99 %. Systems examined below as of 10/30/17   General: No apparent distress alert and oriented x3 mood and affect normal, dressed appropriately.  HEENT: Pupils equal, extraocular movements intact  Respiratory: Patient's speak in full sentences and does not appear short of breath  Cardiovascular: No lower extremity edema, non tender, no erythema  Skin: Warm dry intact with no signs of infection or rash on extremities or on axial skeleton.  Abdomen: Soft nontender  Neuro: Cranial nerves II through XII are intact, neurovascularly intact in all extremities with 2+ DTRs  and 2+ pulses.  Lymph: No lymphadenopathy of posterior or anterior cervical chain or axillae bilaterally.  Gait normal with good balance and coordination.  MSK:  Non tender with full range of motion and good stability and symmetric strength and tone of shoulders, elbows, wrist, knee and ankles bilaterally.  Hip: Right ROM IR: 25 Deg, ER: 45 Deg, Flexion: 120 Deg, Extension: 100 Deg, Abduction: 45 Deg, Adduction: 45 Deg Strength IR: 5/5, ER: 5/5, Flexion: 5/5, Extension: 5/5, Abduction: 4/5, Adduction: 5/5 Pelvic alignment unremarkable to inspection and palpation. Standing hip rotation and gait without trendelenburg sign / unsteadiness. Greater trochanter without tenderness to palpation. No tenderness over piriformis and greater trochanter.  Patient does have tenderness over the tensor fascia lata in the anterior aspect of the leg No pain with FABER or FADIR. Mild pain over the sacroiliac joint on the right side  Osteopathic findings T7 extended rotated and side bent left L3 flexed rotated and side bent right Sacrum right on right    Impression and Recommendations:     This case required medical decision making of moderate complexity.      Note: This dictation was prepared with Dragon dictation along with smaller phrase technology. Any transcriptional errors that result from this process are unintentional.

## 2017-10-30 NOTE — Patient Instructions (Signed)
Good to see you  Snapping hip syndrome Ice 20 minutes 2 times daily. Usually after activity and before bed. Exercises 3 times a week.  Keep doing what you are doing Compression shorts could be good with working out See em again in 4 weeks

## 2017-10-30 NOTE — Assessment & Plan Note (Signed)
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  We discussed icing regimen and home exercise.  Discussed which activities to do which was to avoid.  Patient is to increase activity as tolerated.  Patient will follow-up with me again 4 weeks

## 2017-10-30 NOTE — Assessment & Plan Note (Signed)
Decision today to treat with OMT was based on Physical Exam  After verbal consent patient was treated with HVLA, ME, FPR techniques in  thoracic, lumbar and sacral areas  Patient tolerated the procedure well with improvement in symptoms  Patient given exercises, stretches and lifestyle modifications  See medications in patient instructions if given  Patient will follow up in 4-6 weeks 

## 2017-10-30 NOTE — Assessment & Plan Note (Signed)
Decision today to treat with OMT was based on Physical Exam  After verbal consent patient was treated with HVLA, ME, FPR techniques in cervical, thoracic, lumbar and sacral areas  Patient tolerated the procedure well with improvement in symptoms  Patient given exercises, stretches and lifestyle modifications  See medications in patient instructions if given  Patient will follow up in 4-6 weeks 

## 2017-10-31 DIAGNOSIS — Z853 Personal history of malignant neoplasm of breast: Secondary | ICD-10-CM | POA: Diagnosis not present

## 2017-10-31 DIAGNOSIS — E559 Vitamin D deficiency, unspecified: Secondary | ICD-10-CM | POA: Diagnosis not present

## 2017-10-31 DIAGNOSIS — Z0000 Encounter for general adult medical examination without abnormal findings: Secondary | ICD-10-CM | POA: Diagnosis not present

## 2017-11-01 ENCOUNTER — Telehealth: Payer: Self-pay

## 2017-11-01 LAB — CBC WITH DIFFERENTIAL/PLATELET
BASOS ABS: 0 10*3/uL (ref 0.0–0.2)
Basos: 1 %
EOS (ABSOLUTE): 0.1 10*3/uL (ref 0.0–0.4)
Eos: 2 %
HEMOGLOBIN: 12.7 g/dL (ref 11.1–15.9)
Hematocrit: 39.8 % (ref 34.0–46.6)
Immature Grans (Abs): 0 10*3/uL (ref 0.0–0.1)
Immature Granulocytes: 0 %
LYMPHS ABS: 1.1 10*3/uL (ref 0.7–3.1)
Lymphs: 37 %
MCH: 29.6 pg (ref 26.6–33.0)
MCHC: 31.9 g/dL (ref 31.5–35.7)
MCV: 93 fL (ref 79–97)
MONOCYTES: 8 %
Monocytes Absolute: 0.2 10*3/uL (ref 0.1–0.9)
NEUTROS ABS: 1.5 10*3/uL (ref 1.4–7.0)
Neutrophils: 52 %
Platelets: 187 10*3/uL (ref 150–450)
RBC: 4.29 x10E6/uL (ref 3.77–5.28)
RDW: 13.3 % (ref 12.3–15.4)
WBC: 2.8 10*3/uL — ABNORMAL LOW (ref 3.4–10.8)

## 2017-11-01 LAB — COMPREHENSIVE METABOLIC PANEL
A/G RATIO: 2.2 (ref 1.2–2.2)
ALBUMIN: 4.4 g/dL (ref 3.5–5.5)
ALK PHOS: 53 IU/L (ref 39–117)
ALT: 15 IU/L (ref 0–32)
AST: 17 IU/L (ref 0–40)
BILIRUBIN TOTAL: 0.8 mg/dL (ref 0.0–1.2)
BUN / CREAT RATIO: 14 (ref 9–23)
BUN: 12 mg/dL (ref 6–24)
CHLORIDE: 102 mmol/L (ref 96–106)
CO2: 26 mmol/L (ref 20–29)
Calcium: 9.8 mg/dL (ref 8.7–10.2)
Creatinine, Ser: 0.87 mg/dL (ref 0.57–1.00)
GFR calc Af Amer: 92 mL/min/{1.73_m2} (ref >59)
GFR calc non Af Amer: 80 mL/min/{1.73_m2} (ref >59)
GLUCOSE: 85 mg/dL (ref 65–99)
Globulin, Total: 2 g/dL (ref 1.5–4.5)
POTASSIUM: 4.1 mmol/L (ref 3.5–5.2)
SODIUM: 142 mmol/L (ref 134–144)
Total Protein: 6.4 g/dL (ref 6.0–8.5)

## 2017-11-01 LAB — LIPID PANEL WITH LDL/HDL RATIO
CHOLESTEROL TOTAL: 207 mg/dL — AB (ref 100–199)
HDL: 56 mg/dL (ref >39)
LDL Calculated: 133 mg/dL — ABNORMAL HIGH (ref 0–99)
LDl/HDL Ratio: 2.4 ratio (ref 0.0–3.2)
Triglycerides: 89 mg/dL (ref 0–149)
VLDL CHOLESTEROL CAL: 18 mg/dL (ref 5–40)

## 2017-11-01 LAB — CANCER ANTIGEN 27.29: CAN 27.29: 5.9 U/mL (ref 0.0–38.6)

## 2017-11-01 LAB — VITAMIN D 25 HYDROXY (VIT D DEFICIENCY, FRACTURES): VIT D 25 HYDROXY: 55.4 ng/mL (ref 30.0–100.0)

## 2017-11-01 LAB — TSH: TSH: 2.18 u[IU]/mL (ref 0.450–4.500)

## 2017-11-01 NOTE — Telephone Encounter (Deleted)
-----   Message from Mar Daring, [place] sent at 11/01/2017 10:49 AM EDT ----- WBC count down just slightly but suspect this is due to stressors you have been under recently with [facility] [facility] [employer]. Cholesterol also slightly increased from last year. Kidney and liver function are normal. Sugar is normal. Thyroid is normal. Vit D is normal. CA 27.29 for breast cancer is normal at 5.9.

## 2017-11-01 NOTE — Telephone Encounter (Signed)
-----   Message from Mar Daring, [place] sent at 11/01/2017 10:49 AM EDT ----- WBC count down just slightly but suspect this is due to stressors you have been under recently with [employer]. Cholesterol also slightly increased from last year. Kidney and liver function are normal. Sugar is normal. Thyroid is normal. Vit D is normal. CA 27.29 for breast cancer is normal at 5.9.

## 2017-11-01 NOTE — Telephone Encounter (Signed)
Viewed by Sharlyn Bologna on 11/01/2017 11:21 AM  Written by Mar Daring, PA-C on 11/01/2017 10:49 AM

## 2017-11-08 ENCOUNTER — Encounter: Payer: Self-pay | Admitting: Physician Assistant

## 2017-11-08 DIAGNOSIS — F321 Major depressive disorder, single episode, moderate: Secondary | ICD-10-CM

## 2017-11-08 MED ORDER — SERTRALINE HCL 25 MG PO TABS: 25.0000 mg | ORAL_TABLET | Freq: Every day | ORAL | 0 refills | Status: DC

## 2017-11-28 NOTE — Progress Notes (Signed)
[name] [place],  [address] Phone: [phone] Subjective:    I'm seeing this patient by the request  of:    CC: Back pain follow-up  NTI:[id number]  Kendra [name] is a 47 y.o. female coming in with complaint of back pain.  Patient has some very mild degenerative disc disease and a couple levels of the lumbar spine.  More of discomfort on the right side.  Patient has no radiation of the leg except did have a couple shooting pains into the right buttocks but seem to resolve on its own fairly quickly.  Patient denies any weakness in the lower extremity.  Continues on to have an aching sensation of the back.     Past Medical History:  Diagnosis Date  . Allergy   . Breast cancer ([place]) left   2004 with chemo tx  . Osteoporosis    Past Surgical History:  Procedure Laterality Date  . BREAST SURGERY     implant left in 2004, and biopsy  . MASTECTOMY Left 01/07/03   with tram flap and right breast lift in 2008  . TOTAL ABDOMINAL HYSTERECTOMY     related to breast cancer  . TOTAL ABDOMINAL HYSTERECTOMY W/ BILATERAL SALPINGOOPHORECTOMY  10/2003   Social History   Socioeconomic History  . Marital status: Married    Spouse name: Krystin Keeven  . Number of children: 2  . Years of education: Not on file  . Highest education level: Not on file  Occupational History  . Not on file  Social Needs  . Financial resource strain: Not on file  . Food insecurity:    Worry: Not on file    Inability: Not on file  . Transportation needs:    Medical: Not on file    Non-medical: Not on file  Tobacco Use  . Smoking status: Never Smoker  . Smokeless tobacco: Never Used  Substance and Sexual Activity  . Alcohol use: No  . Drug use: No  . Sexual activity: Yes    Birth control/protection: Other-see comments    Comment: had total hysterectomy  Lifestyle  . Physical activity:    Days per week: Not on file    Minutes per session: Not on file    . Stress: Not on file  Relationships  . Social connections:    Talks on phone: Not on file    Gets together: Not on file    Attends religious service: Not on file    Active member of club or organization: Not on file    Attends meetings of clubs or organizations: Not on file    Relationship status: Not on file  Other Topics Concern  . Not on file  Social History Narrative  . Not on file   No Known Allergies Family History  Problem Relation Age of Onset  . Arthritis Mother   . Hypothyroidism Mother   . Breast cancer Mother 41  . Alcohol abuse Brother      Past medical history, social, surgical and family history all reviewed in electronic medical record.  No pertanent information unless stated regarding to the chief complaint.   Review of Systems:Review of systems updated and as accurate as of 11/29/17  No headache, visual changes, nausea, vomiting, diarrhea, constipation, dizziness, abdominal pain, skin rash, fevers, chills, night sweats, weight loss, swollen lymph nodes, body aches, joint swelling, chest pain, shortness of breath, mood changes.  Positive muscle aches  Objective  Blood pressure  100/70, pulse 79, height 5\' 8"  (1.727 m), weight 143 lb (64.9 kg), SpO2 98 %. Systems examined below as of 11/29/17   General: No apparent distress alert and oriented x3 mood and affect normal, dressed appropriately.  HEENT: Pupils equal, extraocular movements intact  Respiratory: Patient's speak in full sentences and does not appear short of breath  Cardiovascular: No lower extremity edema, non tender, no erythema  Skin: Warm dry intact with no signs of infection or rash on extremities or on axial skeleton.  Abdomen: Soft nontender  Neuro: Cranial nerves II through XII are intact, neurovascularly intact in all extremities with 2+ DTRs and 2+ pulses.  Lymph: No lymphadenopathy of posterior or anterior cervical chain or axillae bilaterally.  Gait normal with good balance and  coordination.  MSK:  Non tender with full range of motion and good stability and symmetric strength and tone of shoulders, elbows, wrist, hip, knee and ankles bilaterally.  Back Exam:  Inspection: Mild loss of lordosis Motion: Flexion 45 deg, Extension 25 deg, Side Bending to 35 deg bilaterally,  Rotation to 35 deg bilaterally  SLR laying: Negative  XSLR laying: Negative  Palpable tenderness: Tender to palpation.  The paraspinal musculature lumbar spine right greater than left. FABER: Positive Faber. Sensory change: Gross sensation intact to all lumbar and sacral dermatomes.  Reflexes: 2+ at both patellar tendons, 2+ at achilles tendons, Babinski's downgoing.  Strength at foot  Plantar-flexion: 5/5 Dorsi-flexion: 5/5 Eversion: 5/5 Inversion: 5/5  Leg strength  Quad: 5/5 Hamstring: 5/5 Hip flexor: 5/5 Hip abductors: 5/5  Gait unremarkable.  Osteopathic findings C6 flexed rotated and side bent left T3 extended rotated and side bent right inhaled third rib T7 extended rotated and side bent right L2 flexed rotated and side bent right Sacrum right on right     Impression and Recommendations:     This case required medical decision making of moderate complexity.      Note: This dictation was prepared with Dragon dictation along with smaller phrase technology. Any transcriptional errors that result from this process are unintentional.

## 2017-11-29 ENCOUNTER — Encounter: Payer: Self-pay | Admitting: Family Medicine

## 2017-11-29 ENCOUNTER — Ambulatory Visit (INDEPENDENT_AMBULATORY_CARE_PROVIDER_SITE_OTHER): Payer: 59 | Admitting: Family Medicine

## 2017-11-29 VITALS — BP 100/70 | HR 79 | Ht 68.0 in | Wt 143.0 lb

## 2017-11-29 DIAGNOSIS — M533 Sacrococcygeal disorders, not elsewhere classified: Secondary | ICD-10-CM | POA: Diagnosis not present

## 2017-11-29 DIAGNOSIS — M999 Biomechanical lesion, unspecified: Secondary | ICD-10-CM | POA: Diagnosis not present

## 2017-11-29 NOTE — Assessment & Plan Note (Signed)
More the discomfort seems to be around the sacroiliac joint.  Discussed icing regimen and home exercises.  Discussed which activities of doing which wants to avoid.  Increase activity as tolerated.  Follow-up again in 4 to 8 weeks

## 2017-11-29 NOTE — Patient Instructions (Signed)
Good to see you  Kendra [name] is your friend Stay active.  Lets watch the back and hip and call if worsen  Otherwise see me again in 3-4 weeks

## 2017-11-29 NOTE — Assessment & Plan Note (Signed)
Decision today to treat with OMT was based on Physical Exam  After verbal consent patient was treated with HVLA, ME, FPR techniques in cervical, thoracic, lumbar and sacral areas  Patient tolerated the procedure well with improvement in symptoms  Patient given exercises, stretches and lifestyle modifications  See medications in patient instructions if given  Patient will follow up in 4-8 weeks 

## 2017-12-28 NOTE — Progress Notes (Signed)
[name] [address], [place] [address] Phone: [phone] Subjective:     CC: Back pain follow-up  YHC:[id number]  Kendra [name] is a 47 y.o. female coming in with complaint of back pain.  Has been seen previously.  Has a dull, throbbing aching sensation.  Patient has more discomfort and pain after working out.  Nothing going down the leg as much as previously.  Rates the severity of pain is more 4 out of 10     Past Medical History:  Diagnosis Date  . Allergy   . Breast cancer ([place]) left   2004 with chemo tx  . Osteoporosis    Past Surgical History:  Procedure Laterality Date  . BREAST SURGERY     implant left in 2004, and biopsy  . MASTECTOMY Left 01/07/03   with tram flap and right breast lift in 2008  . TOTAL ABDOMINAL HYSTERECTOMY     related to breast cancer  . TOTAL ABDOMINAL HYSTERECTOMY W/ BILATERAL SALPINGOOPHORECTOMY  10/2003   Social History   Socioeconomic History  . Marital status: Married    Spouse name: Khristin Keleher  . Number of children: 2  . Years of education: Not on file  . Highest education level: Not on file  Occupational History  . Not on file  Social Needs  . Financial resource strain: Not on file  . Food insecurity:    Worry: Not on file    Inability: Not on file  . Transportation needs:    Medical: Not on file    Non-medical: Not on file  Tobacco Use  . Smoking status: Never Smoker  . Smokeless tobacco: Never Used  Substance and Sexual Activity  . Alcohol use: No  . Drug use: No  . Sexual activity: Yes    Birth control/protection: Other-see comments    Comment: had total hysterectomy  Lifestyle  . Physical activity:    Days per week: Not on file    Minutes per session: Not on file  . Stress: Not on file  Relationships  . Social connections:    Talks on phone: Not on file    Gets together: Not on file    Attends religious service: Not on file    Active member of club or organization:  Not on file    Attends meetings of clubs or organizations: Not on file    Relationship status: Not on file  Other Topics Concern  . Not on file  Social History Narrative  . Not on file   No Known Allergies Family History  Problem Relation Age of Onset  . Arthritis Mother   . Hypothyroidism Mother   . Breast cancer Mother 23  . Alcohol abuse Brother      Past medical history, social, surgical and family history all reviewed in electronic medical record.  No pertanent information unless stated regarding to the chief complaint.   Review of Systems:Review of systems updated and as accurate as of 12/31/17  No headache, visual changes, nausea, vomiting, diarrhea, constipation, dizziness, abdominal pain, skin rash, fevers, chills, night sweats, weight loss, swollen lymph nodes, body aches, joint swelling,  chest pain, shortness of breath, mood changes.  Positive muscle aches  Objective  Blood pressure 120/78, pulse 71, height 5\' 8"  (1.727 m), weight 148 lb (67.1 kg), SpO2 98 %. Systems examined below as of 12/31/17   General: No apparent distress alert and oriented x3 mood and affect normal, dressed appropriately.  HEENT:  Pupils equal, extraocular movements intact  Respiratory: Patient's speak in full sentences and does not appear short of breath  Cardiovascular: No lower extremity edema, non tender, no erythema  Skin: Warm dry intact with no signs of infection or rash on extremities or on axial skeleton.  Abdomen: Soft nontender  Neuro: Cranial nerves II through XII are intact, neurovascularly intact in all extremities with 2+ DTRs and 2+ pulses.  Lymph: No lymphadenopathy of posterior or anterior cervical chain or axillae bilaterally.  Gait normal with good balance and coordination.  MSK:  Non tender with full range of motion and good stability and symmetric strength and tone of shoulders, elbows, wrist, hip, knee and ankles bilaterally.  Back Exam:  Inspection: Mild loss of  lordosis Motion: Flexion 40 deg, Extension 25 deg, Side Bending to 45 deg bilaterally,  Rotation to 45 deg bilaterally  SLR laying: Negative  XSLR laying: Negative  Palpable tenderness: Tender to palpation the paraspinal musculature mostly over the right sacroiliac joint. FABER: Mild positive Corky Sox. Sensory change: Gross sensation intact to all lumbar and sacral dermatomes.  Reflexes: 2+ at both patellar tendons, 2+ at achilles tendons, Babinski's downgoing.  Strength at foot  Plantar-flexion: 5/5 Dorsi-flexion: 5/5 Eversion: 5/5 Inversion: 5/5  Leg strength  Quad: 5/5 Hamstring: 5/5 Hip flexor: 5/5 Hip abductors: 5/5  Gait unremarkable.  Osteopathic findings C2 flexed rotated and side bent right C4 flexed rotated and side bent left T3 extended rotated and side bent right inhaled third rib T5 extended rotated and side bent left L1 flexed rotated and side bent right Sacrum right on right     Impression and Recommendations:     This case required medical decision making of moderate complexity.      Note: This dictation was prepared with Dragon dictation along with smaller phrase technology. Any transcriptional errors that result from this process are unintentional.

## 2017-12-31 ENCOUNTER — Ambulatory Visit (INDEPENDENT_AMBULATORY_CARE_PROVIDER_SITE_OTHER): Payer: 59 | Admitting: Family Medicine

## 2017-12-31 ENCOUNTER — Encounter: Payer: Self-pay | Admitting: Family Medicine

## 2017-12-31 VITALS — BP 120/78 | HR 71 | Ht 68.0 in | Wt 148.0 lb

## 2017-12-31 DIAGNOSIS — M999 Biomechanical lesion, unspecified: Secondary | ICD-10-CM

## 2017-12-31 DIAGNOSIS — M533 Sacrococcygeal disorders, not elsewhere classified: Secondary | ICD-10-CM

## 2017-12-31 NOTE — Patient Instructions (Signed)
Good to see you  Ice when you need it Keep trucking along See me again in 4-6 weeks

## 2017-12-31 NOTE — Assessment & Plan Note (Signed)
Decision today to treat with OMT was based on Physical Exam  After verbal consent patient was treated with HVLA, ME, FPR techniques in cervical, thoracic, rib,  lumbar and sacral areas  Patient tolerated the procedure well with improvement in symptoms  Patient given exercises, stretches and lifestyle modifications  See medications in patient instructions if given  Patient will follow up in 4-8 weeks 

## 2017-12-31 NOTE — Assessment & Plan Note (Signed)
Continues to have some difficulty with significant but doing well.  The sacroiliac joint still has some discomfort more especially on the right side.  Responded fairly well to the [place] manipulation.  Encourage patient continue to work on core instability.  Follow-up again in 4 to 8 weeks

## 2018-01-22 ENCOUNTER — Encounter: Payer: Self-pay | Admitting: Family Medicine

## 2018-01-25 ENCOUNTER — Ambulatory Visit (INDEPENDENT_AMBULATORY_CARE_PROVIDER_SITE_OTHER): Payer: 59 | Admitting: Family Medicine

## 2018-01-25 ENCOUNTER — Encounter: Payer: Self-pay | Admitting: Family Medicine

## 2018-01-25 VITALS — BP 118/64 | HR 65 | Ht 68.0 in | Wt 142.0 lb

## 2018-01-25 DIAGNOSIS — M533 Sacrococcygeal disorders, not elsewhere classified: Secondary | ICD-10-CM | POA: Diagnosis not present

## 2018-01-25 DIAGNOSIS — M999 Biomechanical lesion, unspecified: Secondary | ICD-10-CM

## 2018-01-25 DIAGNOSIS — M5416 Radiculopathy, lumbar region: Secondary | ICD-10-CM

## 2018-01-25 MED ORDER — PREDNISONE 50 MG PO TABS: 50.0000 mg | ORAL_TABLET | Freq: Every day | ORAL | 0 refills | Status: DC

## 2018-01-25 NOTE — Assessment & Plan Note (Signed)
Continues to have pain.  Worsening pain.  Has had radicular symptoms as well.  May need advanced imaging if this continues.  Responded well to osteopathic manipulation.  Follow-up again in 4 to 6 weeks

## 2018-01-25 NOTE — Assessment & Plan Note (Signed)
Decision today to treat with OMT was based on Physical Exam  After verbal consent patient was treated with HVLA, ME, FPR techniques in cervical, thoracic, rib, lumbar and sacral areas  Patient tolerated the procedure well with improvement in symptoms  Patient given exercises, stretches and lifestyle modifications  See medications in patient instructions if given  Patient will follow up in 4 weeks 

## 2018-01-25 NOTE — Progress Notes (Signed)
[name] [address] [place], [place] [address] Phone: [phone] Subjective:    I  CC: Back pain  LNL:[id number]  Kendra [name] is a 47 y.o. female coming in with complaint of back pain. Out of work for 2 days. States she is still sore. Back popped. Back catches. Hips are also painful. Has stretched, foam rolled, ice and heat. Has also been taking muscle relaxer.   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  Past Medical History:  Diagnosis Date  . Allergy   . Breast cancer ([place]) left   2004 with chemo tx  . Osteoporosis    Past Surgical History:  Procedure Laterality Date  . BREAST SURGERY     implant left in 2004, and biopsy  . MASTECTOMY Left 01/07/03   with tram flap and right breast lift in 2008  . TOTAL ABDOMINAL HYSTERECTOMY     related to breast cancer  . TOTAL ABDOMINAL HYSTERECTOMY W/ BILATERAL SALPINGOOPHORECTOMY  10/2003   Social History   Socioeconomic History  . Marital status: Married    Spouse name: Matthew Pais  . Number of children: 2  . Years of education: Not on file  . Highest education level: Not on file  Occupational History  . Not on file  Social Needs  . Financial resource strain: Not on file  . Food insecurity:    Worry: Not on file    Inability: Not on file  . Transportation needs:    Medical: Not on file    Non-medical: Not on file  Tobacco Use  . Smoking status: Never Smoker  . Smokeless tobacco: Never Used  Substance and Sexual Activity  . Alcohol use: No  . Drug use: No  . Sexual activity: Yes    Birth control/protection: Other-see comments    Comment: had  total hysterectomy  Lifestyle  . Physical activity:    Days per week: Not on file    Minutes per session: Not on file  . Stress: Not on file  Relationships  . Social connections:    Talks on phone: Not on file    Gets together: Not on file    Attends religious service: Not on file    Active member of club or organization: Not on file    Attends meetings of clubs or organizations: Not on file    Relationship status: Not on file  Other Topics Concern  . Not on file  Social History Narrative  . Not on file   No Known Allergies Family History  Problem Relation Age of Onset  . Arthritis Mother   . Hypothyroidism Mother   . Breast cancer Mother 36  . Alcohol abuse Brother      Past medical history, social, surgical and family history all reviewed in electronic medical record.  No pertanent information unless stated regarding to the chief complaint.   Review of Systems:Review of systems updated and as accurate as of 01/25/18  No headache, visual changes, nausea, vomiting, diarrhea, constipation, dizziness, abdominal pain, skin rash, fevers, chills, night sweats, weight loss, swollen lymph nodes, body aches, joint swelling, muscle aches, chest pain, shortness of breath, mood changes.   Objective  Blood pressure 118/64, pulse 65,  height 5\' 8"  (1.727 m), weight 142 lb (64.4 kg), SpO2 97 %. Systems examined below as of 01/25/18   General: No apparent distress alert and oriented x3 mood and affect normal, dressed appropriately.  HEENT: Pupils equal, extraocular movements intact  Respiratory: Patient's speak in full sentences and does not appear short of breath  Cardiovascular: No lower extremity edema, non tender, no erythema  Skin: Warm dry intact with no signs of infection or rash on extremities or on axial skeleton.  Abdomen: Soft nontender  Neuro: Cranial nerves II through XII are intact, neurovascularly intact in all extremities with 2+ DTRs and 2+ pulses.  Lymph: No  lymphadenopathy of posterior or anterior cervical chain or axillae bilaterally.  Gait normal with good balance and coordination.  MSK:  Non tender with full range of motion and good stability and symmetric strength and tone of shoulders, elbows, wrist, hip, knee and ankles bilaterally.  Back Exam:  Inspection: Loss of lordosis Motion: Flexion 45 deg, Extension 25 deg, Side Bending to 45 deg bilaterally,  Rotation to 30 deg bilaterally  SLR laying: Mild positive right XSLR laying: Negative  Palpable tenderness: Tender to palpation the paraspinal musculature lumbar spine mostly around the sacroiliac joint bilaterally. FABER: Positive right. Sensory change: Gross sensation intact to all lumbar and sacral dermatomes.  Reflexes: 2+ at both patellar tendons, 2+ at achilles tendons, Babinski's downgoing.  Strength at foot  Plantar-flexion: 5/5 Dorsi-flexion: 5/5 Eversion: 5/5 Inversion: 5/5  Leg strength  Quad: 5/5 Hamstring: 5/5 Hip flexor: 5/5 Hip abductors: 5/5  Gait unremarkable.  Osteopathic findings C2 flexed rotated and side bent right C4 flexed rotated and side bent left T3 extended rotated and side bent right inhaled third rib T9 extended rotated and side bent left L2 flexed rotated and side bent right Sacrum right on right    Impression and Recommendations:     This case required medical decision making of moderate complexity.      Note: This dictation was prepared with Dragon dictation along with smaller phrase technology. Any transcriptional errors that result from this process are unintentional.

## 2018-01-25 NOTE — Patient Instructions (Signed)
Good to see you  I hope it helps Prednisone daily for 5 days if not better See me again on the 29th and we will discuss piriformis injection or consider new mri

## 2018-01-25 NOTE — Assessment & Plan Note (Signed)
Patient does not want to do gabapentin.  Given prednisone in case of flare.  Follow-up again in 4 weeks

## 2018-02-01 ENCOUNTER — Encounter: Payer: Self-pay | Admitting: Family Medicine

## 2018-02-04 IMAGING — CR DG SHOULDER 2+V*R*
1 series · 3 of 3 positions shown · non-contrast
Comparison: No recent prior.

CLINICAL DATA: While lifting weights.

EXAM:
RIGHT SHOULDER - 2+ VIEW

[Series 1: dg shoulder right · 0.14mm/px · 3 of 3 slices shown]
[im 1/3]
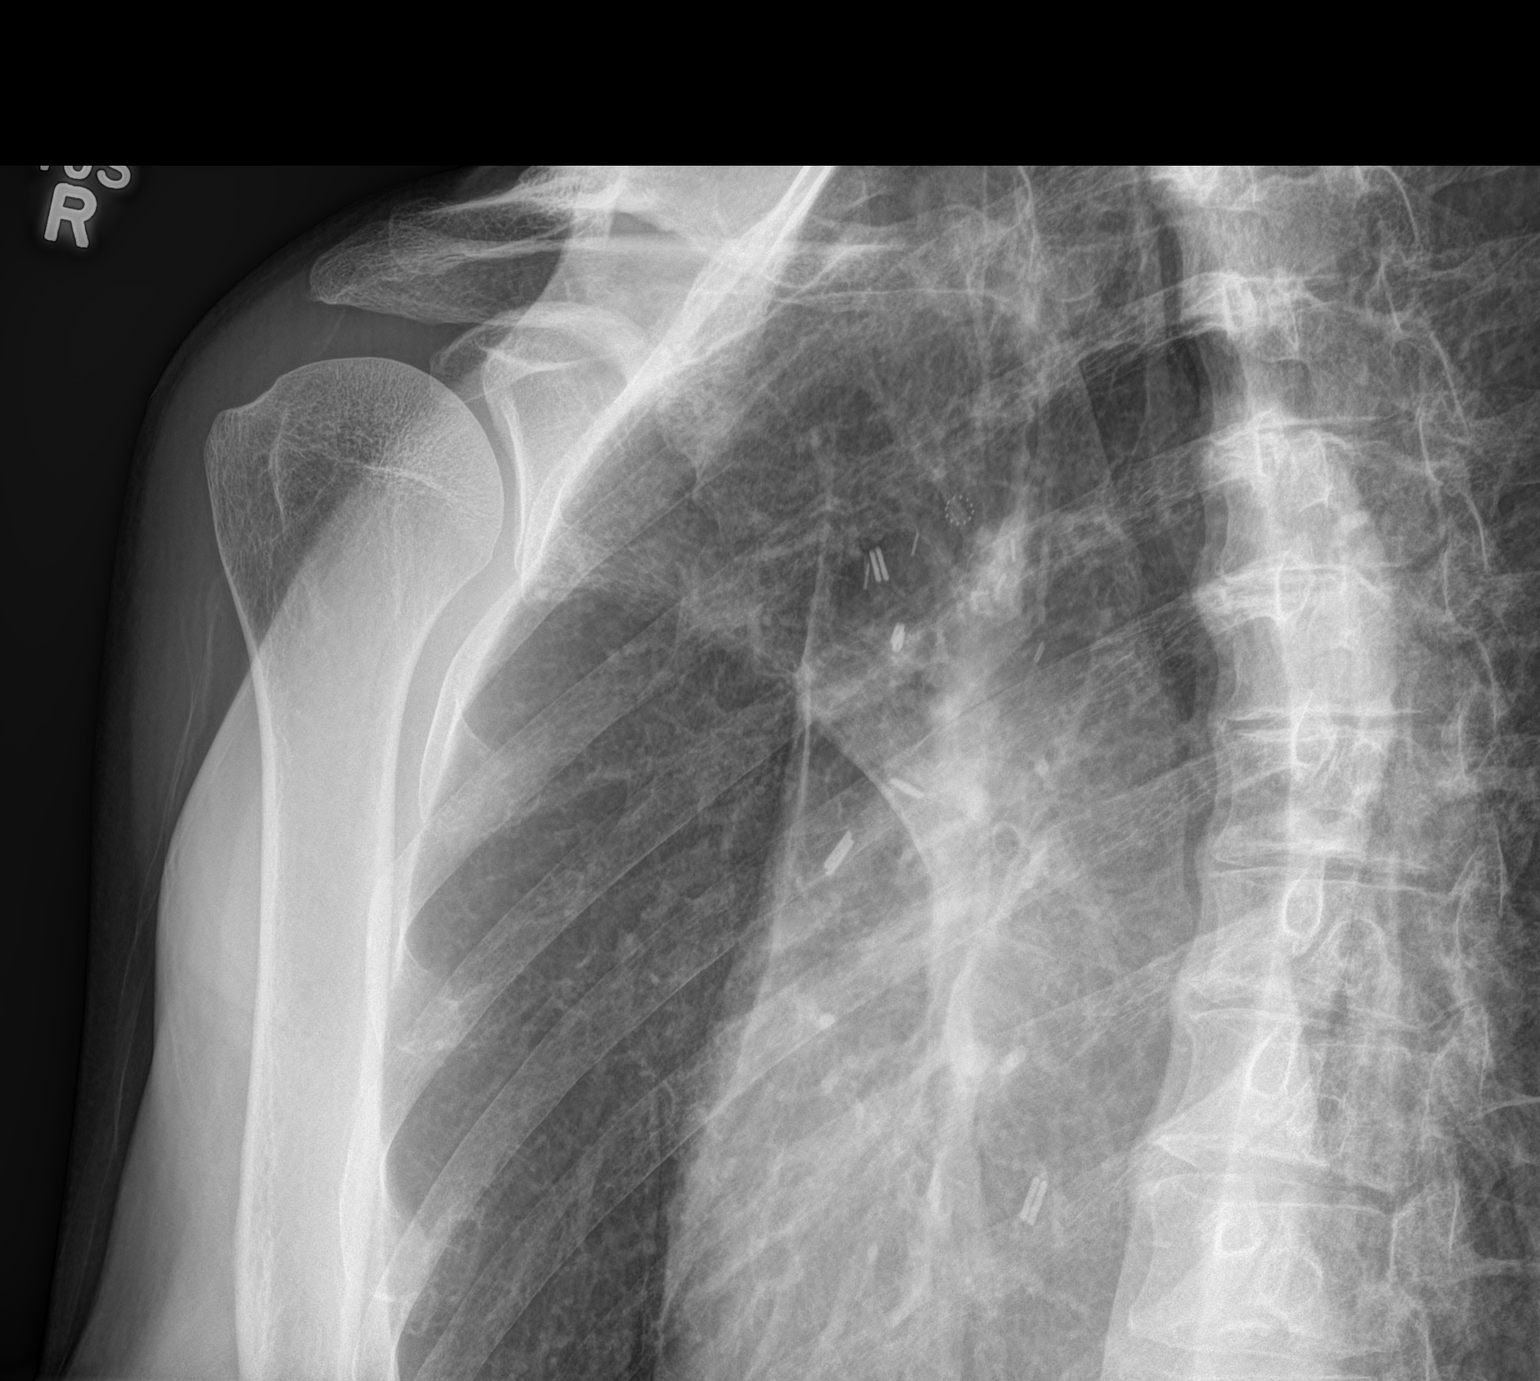
[im 2/3]
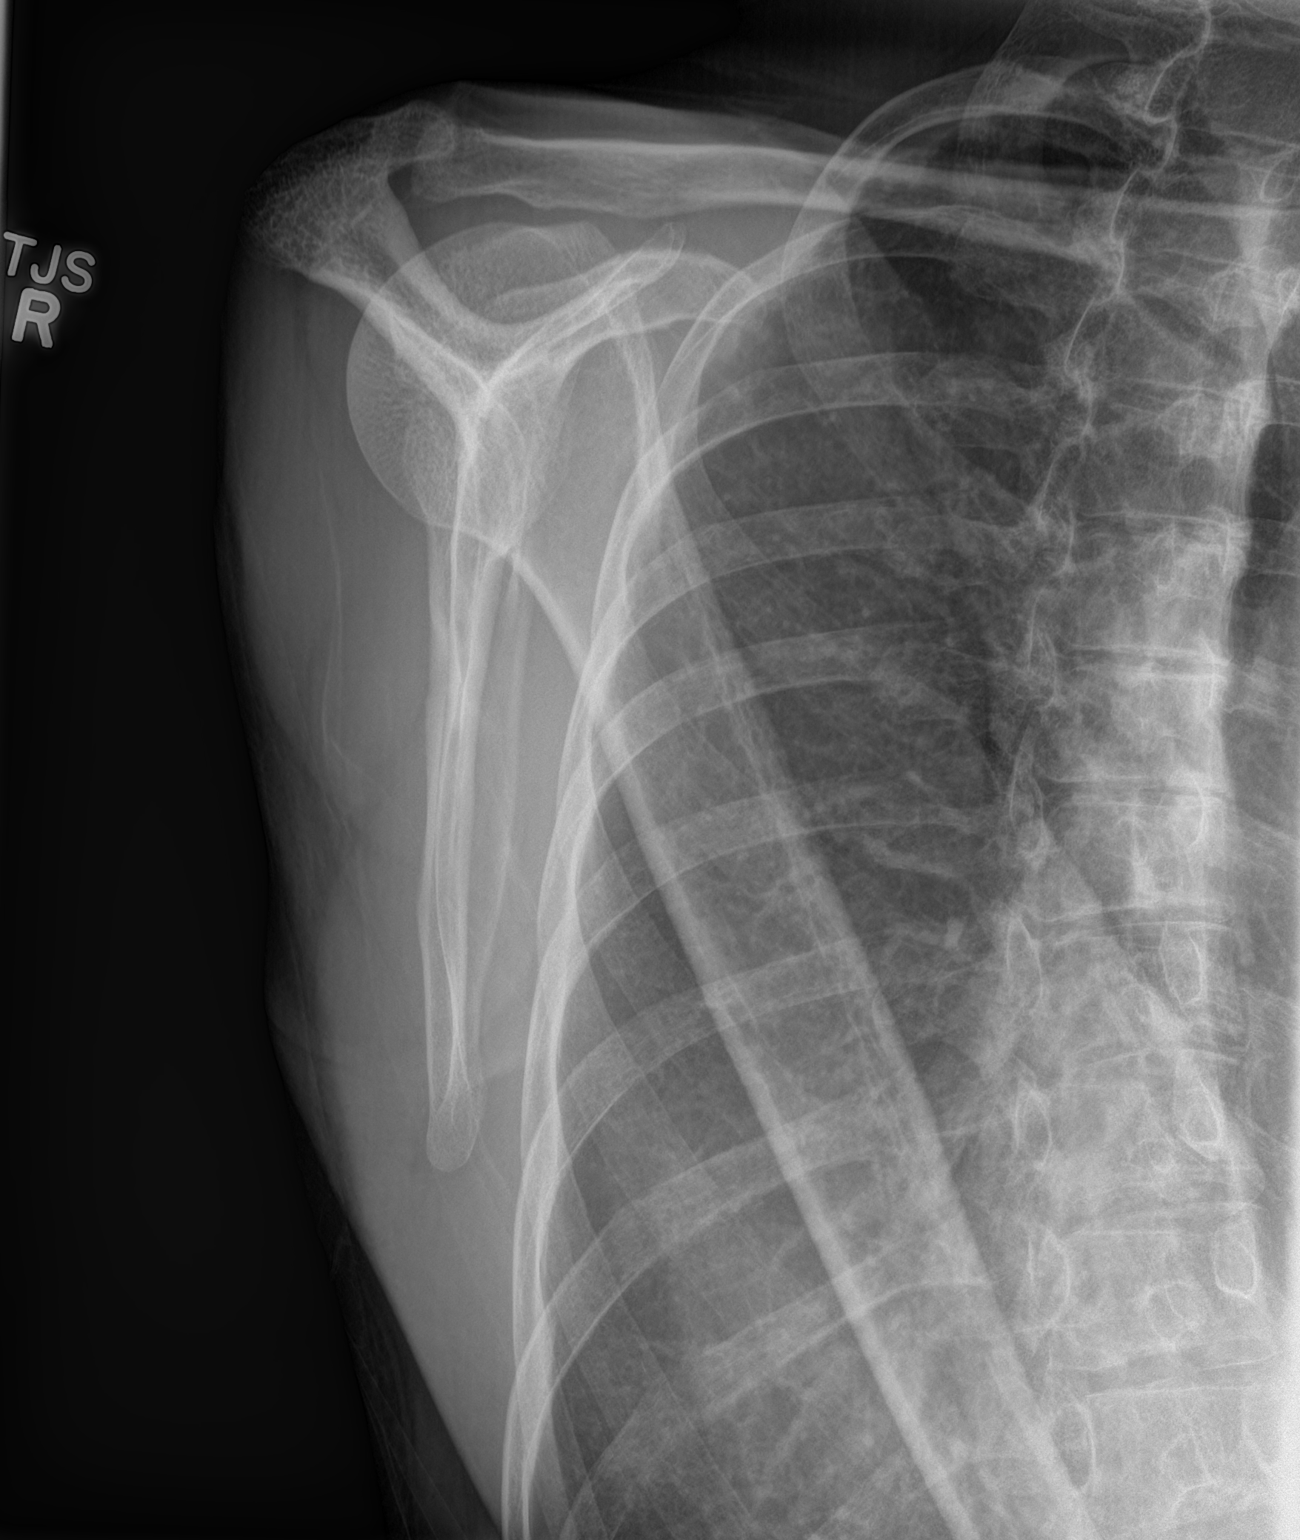
[im 3/3]
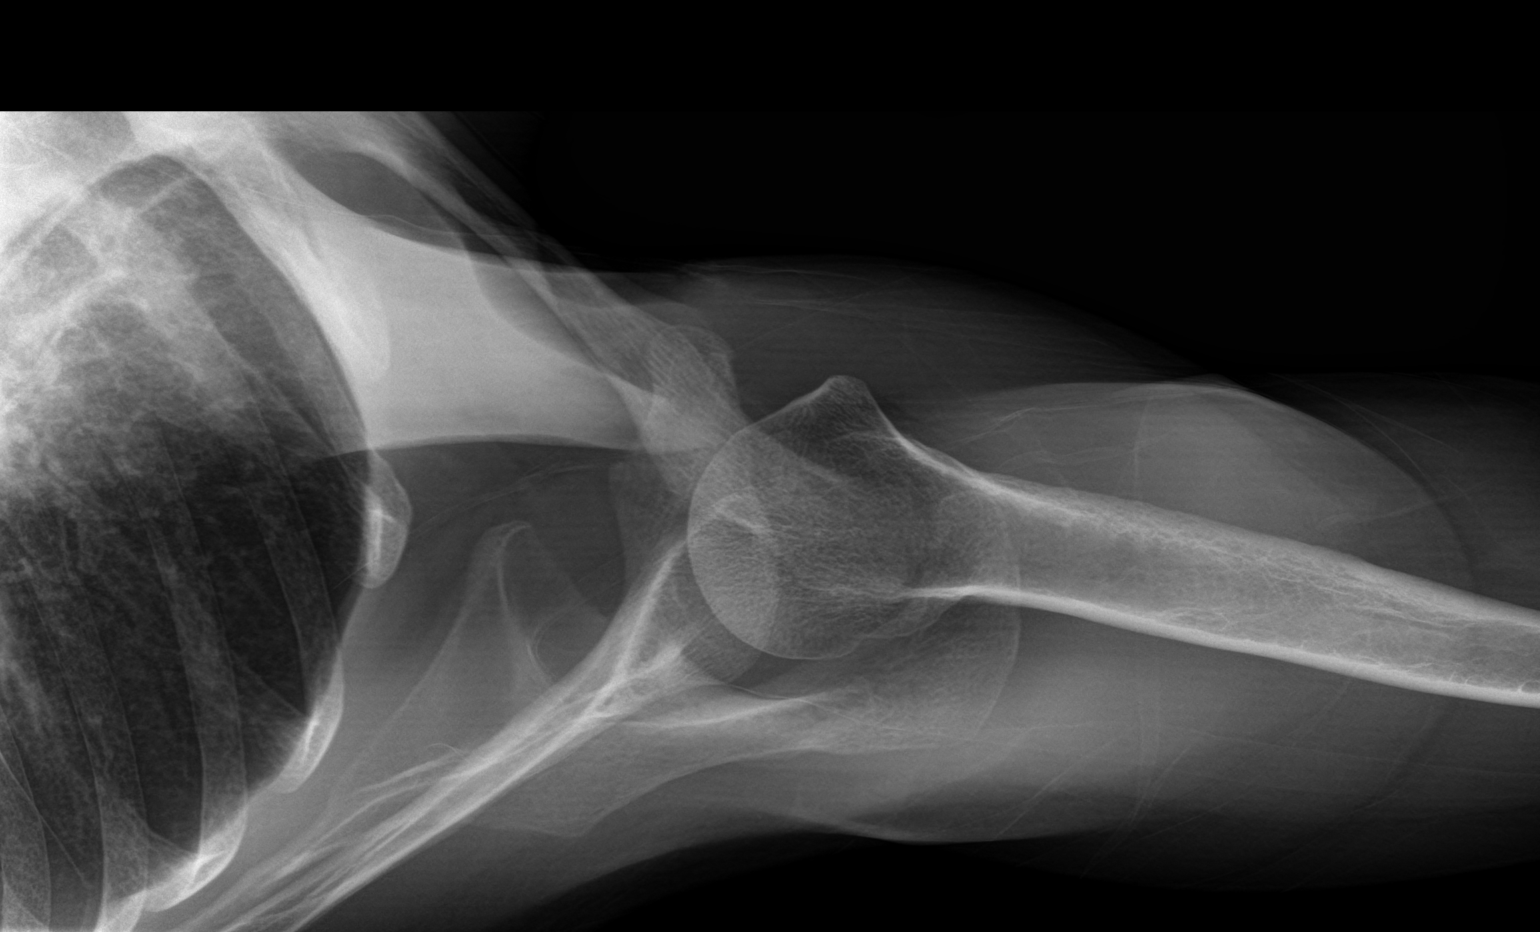

[3 of 3 positions shown; findings below may reference images not displayed]

FINDINGS: No acute bony or joint abnormality. No evidence of fracture
dislocation. No evidence of separation. Postsurgical changes right
chest.
IMPRESSION: No acute abnormality identified.

## 2018-02-05 ENCOUNTER — Other Ambulatory Visit: Payer: Self-pay | Admitting: Physician Assistant

## 2018-02-05 DIAGNOSIS — F321 Major depressive disorder, single episode, moderate: Secondary | ICD-10-CM

## 2018-02-07 ENCOUNTER — Ambulatory Visit (INDEPENDENT_AMBULATORY_CARE_PROVIDER_SITE_OTHER): Payer: 59 | Admitting: Family Medicine

## 2018-02-07 ENCOUNTER — Encounter: Payer: Self-pay | Admitting: Family Medicine

## 2018-02-07 VITALS — BP 104/70 | HR 76 | Ht 68.0 in | Wt 144.0 lb

## 2018-02-07 DIAGNOSIS — M999 Biomechanical lesion, unspecified: Secondary | ICD-10-CM

## 2018-02-07 DIAGNOSIS — M5416 Radiculopathy, lumbar region: Secondary | ICD-10-CM | POA: Diagnosis not present

## 2018-02-07 DIAGNOSIS — R102 Pelvic and perineal pain unspecified side: Secondary | ICD-10-CM

## 2018-02-07 NOTE — Progress Notes (Signed)
[name] [address] [place], [place] [address] Phone: [phone] Subjective:    I'm seeing this patient by the request  of:    I Kendra [name] am serving as a scribe for Dr. Hulan Saas.  CC: Back pain follow-up  Kendra [name]  Kendra [name] is a 47 y.o. female coming in with complaint of back pain. States that she has a lot of pressure. Her "belt line" has a achy pain down to her bones. Still feels like she is tight. Prednisone didn't help. Feels as if she has a knot in her piriformis. Standing for a long time makes her bones hurt. Patient feels like she may be even having some weight loss.  Starting to even affect her bowel movements.  Patient denies any fevers or chills.  Patient states that this pain is severe.  Past medical history is significant for breast cancer.     Past Medical History:  Diagnosis Date  . Allergy   . Breast cancer ([place]) left   2004 with chemo tx  . Osteoporosis    Past Surgical History:  Procedure Laterality Date  . BREAST SURGERY     implant left in 2004, and biopsy  . MASTECTOMY Left 01/07/03   with tram flap and right breast lift in 2008  . TOTAL ABDOMINAL HYSTERECTOMY     related to breast cancer  . TOTAL ABDOMINAL HYSTERECTOMY W/ BILATERAL SALPINGOOPHORECTOMY  10/2003   Social History   Socioeconomic History  . Marital status: Married    Spouse name: Kendra [name]  . Number of children: 2  . Years of education: Not on file  . Highest education level: Not on file  Occupational History  . Not on file  Social Needs  . Financial resource strain: Not on file  . Food insecurity:    Worry: Not on file    Inability: Not on file  . Transportation needs:    Medical: Not on file    Non-medical: Not on file  Tobacco Use  . Smoking status: Never Smoker  . Smokeless tobacco: Never Used  Substance and Sexual Activity  . Alcohol use: No  . Drug use: No  . Sexual activity: Yes    Birth control/protection:  Other-see comments    Comment: had total hysterectomy  Lifestyle  . Physical activity:    Days per week: Not on file    Minutes per session: Not on file  . Stress: Not on file  Relationships  . Social connections:    Talks on phone: Not on file    Gets together: Not on file    Attends religious service: Not on file    Active member of club or organization: Not on file    Attends meetings of clubs or organizations: Not on file    Relationship status: Not on file  Other Topics Concern  . Not on file  Social History Narrative  . Not on file   No Known Allergies Family History  Problem Relation Age of Onset  . Arthritis Mother   . Hypothyroidism Mother   . Breast cancer Mother 47  . Alcohol abuse Brother     Current Outpatient Medications (Endocrine & Metabolic):  .  predniSONE (DELTASONE) 50 MG tablet, Take 1 tablet (50 mg total) by mouth daily.   Current Outpatient Medications (Respiratory):  Marland Kitchen  ALLERGY RELIEF/NASAL DECONGEST 10-240 MG 24 hr tablet, TAKE ONE TABLET BY MOUTH DAILY .  fluticasone (FLONASE) 50 MCG/ACT nasal spray,  PLACE 1 SPRAY INTO BOTH NOSTRILS DAILY.    Current Outpatient Medications (Other):  Marland Kitchen  Calcium-Vitamin D 600-200 MG-UNIT per tablet, Take 1 tablet by mouth daily.  .  sertraline (ZOLOFT) 25 MG tablet, TAKE 1 TABLET (25 MG TOTAL) BY MOUTH DAILY. .  valACYclovir (VALTREX) 1000 MG tablet, TAKE 2 TABLETS BY MOUTH TWICE A DAY .  VITAMIN D, CHOLECALCIFEROL, PO, Take 1 capsule by mouth daily.    Past medical history, social, surgical and family history all reviewed in electronic medical record.  No pertanent information unless stated regarding to the chief complaint.   Review of Systems:  No headache, visual changes, nausea, vomiting, diarrhea, constipation, dizziness, abdominal pain, skin rash, fevers, chills, night sweats, weight loss, swollen lymph nodes,  joint swelling,  chest pain, shortness of breath, mood changes.  Positive muscle aches, body  aches  Objective  Blood pressure 104/70, pulse 76, height 5\' 8"  (1.727 m), weight 144 lb (65.3 kg), SpO2 97 %.    General: No apparent distress alert and oriented x3 mood and affect normal, dressed appropriately.  HEENT: Pupils equal, extraocular movements intact  Respiratory: Patient's speak in full sentences and does not appear short of breath  Cardiovascular: No lower extremity edema, non tender, no erythema  Skin: Warm dry intact with no signs of infection or rash on extremities or on axial skeleton.  Abdomen: Soft nontender  Neuro: Cranial nerves II through XII are intact, neurovascularly intact in all extremities with 2+ Kendra [name] and 2+ pulses.  Lymph: No lymphadenopathy of posterior or anterior cervical chain or axillae bilaterally.  Gait normal with good balance and coordination.  MSK:  Non tender with full range of motion and good stability and symmetric strength and tone of shoulders, elbows, wrist, hip, knee and ankles bilaterally.   Back Exam:  Inspection: Loss of lordosis Motion: Flexion 45 deg, Extension 25 deg, Side Bending to 35 deg bilaterally,  Rotation to 35 deg bilaterally  SLR laying: Positive on the right side XSLR laying: Negative  Palpable tenderness: Tender to palpation the paraspinal musculature lumbar spine.  Increased tenderness over the sacroiliac joints and the piriformis.  Patient also has pain anteriorly over the pubic symphysis which is new.  This is even to light palpation FABER: Positive Kendra [name]. Sensory change: Gross sensation intact to all lumbar and sacral dermatomes.  Reflexes: 2+ at both patellar tendons, 2+ at achilles tendons, Kendra [name]'s downgoing.  Strength at foot  Plantar-flexion: 5/5 Dorsi-flexion: 5/5 Eversion: 5/5 Inversion: 5/5  Leg strength  Quad: 5/5 Hamstring: 5/5 Hip flexor: 4/5 Hip abductors: 4/5 but symmetric  Osteopathic findings  C2 flexed rotated and side bent right C4 flexed rotated and side bent left T3 extended rotated and side  bent right inhaled third rib T6 extended rotated and side bent left L2 flexed rotated and side bent right Sacrum right on right     Impression and Recommendations:     This case required medical decision making of moderate complexity. The above documentation has been reviewed and is accurate and complete Kendra Pulley, DO       Note: This dictation was prepared with Dragon dictation along with smaller phrase technology. Any transcriptional errors that result from this process are unintentional.

## 2018-02-07 NOTE — Assessment & Plan Note (Signed)
Decision today to treat with OMT was based on Physical Exam  After verbal consent patient was treated with HVLA, ME, FPR techniques in cervical, thoracic, rib, lumbar and sacral areas  Patient tolerated the procedure well with improvement in symptoms  Patient given exercises, stretches and lifestyle modifications  See medications in patient instructions if given  Patient will follow up in 4 weeks 

## 2018-02-07 NOTE — Assessment & Plan Note (Addendum)
Lumbar radiculopathy.  Discussed icing regimen and home exercise.  Discussed which activities to do which wants to avoid.  Discussed icing regimen.  Patient is having worsening pain that is out of proportion.  Discussed with patient that I feel advanced imaging is warranted at this time.  Patient will follow-up with me in 4 weeks History of breast cancer and so advanced imaging is warranted

## 2018-02-07 NOTE — Patient Instructions (Signed)
Good to see you  MRI is ordered I am sorry not a good reason at the moment Duexis 3 times a day for 6 days can help   Seeme again in 3 weeks but we will be talking

## 2018-02-12 ENCOUNTER — Other Ambulatory Visit: Payer: Self-pay

## 2018-02-12 ENCOUNTER — Ambulatory Visit (INDEPENDENT_AMBULATORY_CARE_PROVIDER_SITE_OTHER): Payer: 59

## 2018-02-12 ENCOUNTER — Ambulatory Visit
Admission: EM | Admit: 2018-02-12 | Discharge: 2018-02-12 | Disposition: A | Discharge status: Home / Self Care | Payer: 59 | Attending: Emergency Medicine | Admitting: Emergency Medicine

## 2018-02-12 ENCOUNTER — Encounter: Payer: Self-pay | Admitting: Emergency Medicine

## 2018-02-12 DIAGNOSIS — S99922A Unspecified injury of left foot, initial encounter: Secondary | ICD-10-CM | POA: Diagnosis not present

## 2018-02-12 DIAGNOSIS — S93602A Unspecified sprain of left foot, initial encounter: Secondary | ICD-10-CM

## 2018-02-12 DIAGNOSIS — M79672 Pain in left foot: Secondary | ICD-10-CM | POA: Diagnosis not present
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
## 2018-02-12 NOTE — ED Triage Notes (Signed)
Pt c/o left foot pain. She jumped off the diving board yesterday and hit it. Started hurting worse last night and worse with weight bearing.

## 2018-02-12 NOTE — Discharge Instructions (Signed)
Apply ice 20 minutes out of every 2 hours 4-5 times daily for comfort.  Elevate above the level of your heart is much as possible  to control pain and swelling

## 2018-02-18 ENCOUNTER — Ambulatory Visit
Admission: RE | Admit: 2018-02-18 | Discharge: 2018-02-18 | Disposition: A | Discharge status: Home / Self Care | Payer: 59 | Source: Ambulatory Visit | Attending: Physician Assistant | Admitting: Physician Assistant

## 2018-02-18 DIAGNOSIS — M810 Age-related osteoporosis without current pathological fracture: Secondary | ICD-10-CM | POA: Diagnosis not present

## 2018-02-18 DIAGNOSIS — Z780 Asymptomatic menopausal state: Secondary | ICD-10-CM | POA: Diagnosis not present

## 2018-02-18 DIAGNOSIS — M8588 Other specified disorders of bone density and structure, other site: Secondary | ICD-10-CM | POA: Diagnosis not present

## 2018-02-18 DIAGNOSIS — M8589 Other specified disorders of bone density and structure, multiple sites: Secondary | ICD-10-CM

## 2018-02-18 DIAGNOSIS — Z853 Personal history of malignant neoplasm of breast: Secondary | ICD-10-CM | POA: Diagnosis not present

## 2018-02-18 DIAGNOSIS — Z13820 Encounter for screening for osteoporosis: Secondary | ICD-10-CM | POA: Insufficient documentation

## 2018-02-18 DIAGNOSIS — E559 Vitamin D deficiency, unspecified: Secondary | ICD-10-CM | POA: Insufficient documentation

## 2018-02-19 ENCOUNTER — Encounter: Payer: Self-pay | Admitting: Family Medicine

## 2018-02-27 NOTE — Progress Notes (Signed)
[name] [place],  [address] Phone: [phone] Subjective:   Kendra [name], am serving as a scribe for Dr. Hulan Saas.    CC: Back pain  [facility]:[id number]  Kendra [name] is a 47 y.o. female coming in with complaint of back pain. Pain in her hips has decreased. Her right hip still is bothering her. Constant dull pain. Has been using Duexis which really helped her pain she states. Left hip has not bothered her at all since last visit. MRI next Wednesday for pain on the right side of her lumbar spine. Tingling in second toe on right foot.        Past Medical History:  Diagnosis Date  . Allergy   . Breast cancer ([place]) left   2004 with chemo tx  . Osteoporosis    Past Surgical History:  Procedure Laterality Date  . BREAST SURGERY     implant left in 2004, and biopsy  . MASTECTOMY Left 01/07/03   with tram flap and right breast lift in 2008  . TOTAL ABDOMINAL HYSTERECTOMY     related to breast cancer  . TOTAL ABDOMINAL HYSTERECTOMY W/ BILATERAL SALPINGOOPHORECTOMY  10/2003   Social History   Socioeconomic History  . Marital status: Married    Spouse name: Kendra [name]  . Number of children: 2  . Years of education: Not on file  . Highest education level: Not on file  Occupational History  . Not on file  Social Needs  . Financial resource strain: Not on file  . Food insecurity:    Worry: Not on file    Inability: Not on file  . Transportation needs:    Medical: Not on file    Non-medical: Not on file  Tobacco Use  . Smoking status: Never Smoker  . Smokeless tobacco: Never Used  Substance and Sexual Activity  . Alcohol use: [name]  . Drug use: [name]  . Sexual activity: Yes    Birth control/protection: Other-see comments    Comment: had total hysterectomy  Lifestyle  . Physical activity:    Days per week: Not on file    Minutes per session: Not on file  . Stress: Not on file  Relationships  . Social connections:      Talks on phone: Not on file    Gets together: Not on file    Attends religious service: Not on file    Active member of club or organization: Not on file    Attends meetings of clubs or organizations: Not on file    Relationship status: Not on file  Other Topics Concern  . Not on file  Social History Narrative  . Not on file   [name] Known Allergies Family History  Problem Relation Age of Onset  . Arthritis Mother   . Hypothyroidism Mother   . Breast cancer Mother 51  . Alcohol abuse Brother       Current Outpatient Medications (Respiratory):  Marland Kitchen  ALLERGY RELIEF/NASAL DECONGEST 10-240 MG 24 hr tablet, TAKE ONE TABLET BY MOUTH DAILY .  fluticasone (FLONASE) 50 MCG/ACT nasal spray, PLACE 1 SPRAY INTO BOTH NOSTRILS DAILY.    Current Outpatient Medications (Other):  Marland Kitchen  Calcium-Vitamin D 600-200 MG-UNIT per tablet, Take 1 tablet by mouth daily.  .  sertraline (ZOLOFT) 25 MG tablet, TAKE 1 TABLET (25 MG TOTAL) BY MOUTH DAILY. .  valACYclovir (VALTREX) 1000 MG tablet, TAKE 2 TABLETS BY MOUTH TWICE A DAY .  VITAMIN D, CHOLECALCIFEROL, PO, Take 1 capsule by mouth daily. .  Vitamin D, Ergocalciferol, (DRISDOL) 50000 units CAPS capsule, Take 1 capsule (50,000 Units total) by mouth every 7 (seven) days.    Past medical history, social, surgical and family history all reviewed in electronic medical record.  [name] pertanent information unless stated regarding to the chief complaint.   Review of Systems:  [name] headache, visual changes, nausea, vomiting, diarrhea, constipation, dizziness, abdominal pain, skin rash, fevers, chills, night sweats, weight loss, swollen lymph nodes, body aches, joint swelling,  chest pain, shortness of breath, mood changes.  Positive muscle aches  Objective  Blood pressure 118/74, pulse 75, height 5' 7.5" (1.715 m), weight 146 lb (66.2 kg), SpO2 98 %.    General: [name] apparent distress alert and oriented x3 mood and affect normal, dressed appropriately.  HEENT:  Pupils equal, extraocular movements intact  Respiratory: Patient's speak in full sentences and does not appear short of breath  Cardiovascular: [name] lower extremity edema, non tender, [name] erythema  Skin: Warm dry intact with [name] signs of infection or rash on extremities or on axial skeleton.  Abdomen: Soft nontender  Neuro: Cranial nerves II through XII are intact, neurovascularly intact in all extremities with 2+ DTRs and 2+ pulses.  Lymph: [name] lymphadenopathy of posterior or anterior cervical chain or axillae bilaterally.  Gait normal with good balance and coordination.  MSK:  Non tender with full range of motion and good stability and symmetric strength and tone of shoulders, elbows, wrist, hip, knee and ankles bilaterally.  Back Exam:  Inspection: Loss of lordosis Motion: Flexion 45 deg, Extension 25 deg, Side Bending to 45 deg bilaterally,  Rotation to 45 deg bilaterally  SLR laying: Positive radicular symptoms at 25 degrees of forward flexion XSLR laying: Negative  Palpable tenderness: Moderate to severe tenderness to palpation the paraspinal musculature. FABER: negative. Sensory change: Gross sensation intact to all lumbar and sacral dermatomes.  Reflexes: 2+ at both patellar tendons, 2+ at achilles tendons, Babinski's downgoing.  Strength at foot  4 out of 5 on the right compared to left  Osteopathic findings C6 flexed rotated and side bent left T3 extended rotated and side bent right inhaled third rib T9 extended rotated and side bent left      Impression and Recommendations:     This case required medical decision making of moderate complexity. The above documentation has been reviewed and is accurate and complete Kendra Pulley, DO       Note: This dictation was prepared with Dragon dictation along with smaller phrase technology. Any transcriptional errors that result from this process are unintentional.

## 2018-02-28 ENCOUNTER — Ambulatory Visit (INDEPENDENT_AMBULATORY_CARE_PROVIDER_SITE_OTHER): Payer: 59 | Admitting: Family Medicine

## 2018-02-28 ENCOUNTER — Encounter: Payer: Self-pay | Admitting: Family Medicine

## 2018-02-28 VITALS — BP 118/74 | HR 75 | Ht 67.5 in | Wt 146.0 lb

## 2018-02-28 DIAGNOSIS — M999 Biomechanical lesion, unspecified: Secondary | ICD-10-CM | POA: Diagnosis not present

## 2018-02-28 DIAGNOSIS — M5416 Radiculopathy, lumbar region: Secondary | ICD-10-CM

## 2018-02-28 MED ORDER — VITAMIN D (ERGOCALCIFEROL) 1.25 MG (50000 UNIT) PO CAPS: 50000.0000 [IU] | ORAL_CAPSULE | ORAL | 0 refills | Status: DC

## 2018-02-28 NOTE — Patient Instructions (Signed)
Good to see you  Kendra [name] is your friend.  Stay active Once weekly vitamin D for 12 weeks See me again in 4 weeks I will write you when I get the MRI ad we will discuss next step

## 2018-02-28 NOTE — Assessment & Plan Note (Signed)
Decision today to treat with OMT was based on Physical Exam  After verbal consent patient was treated with HVLA, ME, FPR techniques in cervical, thoracic, and ribareas  Patient tolerated the procedure well with improvement in symptoms  Patient given exercises, stretches and lifestyle modifications  See medications in patient instructions if given  Patient will follow up in 4 weeks

## 2018-02-28 NOTE — Assessment & Plan Note (Signed)
Patient still having radicular symptoms did not want to manipulate.  Patient did well to manipulation though of the neck, mid back as well.  Patient responded well to this.  We discussed icing regimen and home exercises.  Will await patient's imaging for further evaluation of her back and would be a candidate for epidurals.  Patient is in agreement the plan and will follow-up after the imaging.

## 2018-03-06 ENCOUNTER — Ambulatory Visit
Admission: RE | Admit: 2018-03-06 | Discharge: 2018-03-06 | Disposition: A | Discharge status: Home / Self Care | Payer: 59 | Source: Ambulatory Visit | Attending: Family Medicine | Admitting: Family Medicine

## 2018-03-06 DIAGNOSIS — S76211A Strain of adductor muscle, fascia and tendon of right thigh, initial encounter: Secondary | ICD-10-CM | POA: Diagnosis not present

## 2018-03-06 DIAGNOSIS — R102 Pelvic and perineal pain: Secondary | ICD-10-CM | POA: Insufficient documentation

## 2018-03-06 DIAGNOSIS — M5416 Radiculopathy, lumbar region: Secondary | ICD-10-CM

## 2018-03-06 DIAGNOSIS — T148XXA Other injury of unspecified body region, initial encounter: Secondary | ICD-10-CM | POA: Insufficient documentation

## 2018-03-06 DIAGNOSIS — M545 Low back pain: Secondary | ICD-10-CM | POA: Diagnosis not present

## 2018-03-06 DIAGNOSIS — M5136 Other intervertebral disc degeneration, lumbar region: Secondary | ICD-10-CM | POA: Insufficient documentation

## 2018-03-06 DIAGNOSIS — M5126 Other intervertebral disc displacement, lumbar region: Secondary | ICD-10-CM | POA: Diagnosis not present

## 2018-03-06 DIAGNOSIS — X58XXXA Exposure to other specified factors, initial encounter: Secondary | ICD-10-CM | POA: Insufficient documentation

## 2018-03-06 DIAGNOSIS — M7601 Gluteal tendinitis, right hip: Secondary | ICD-10-CM | POA: Diagnosis not present

## 2018-03-06 MED ORDER — GADOBENATE DIMEGLUMINE 529 MG/ML IV SOLN
10.0000 mL | Freq: Once | INTRAVENOUS | Status: AC | PRN
  Administered 2018-03-06: 10 mL via INTRAVENOUS

## 2018-03-12 ENCOUNTER — Encounter: Payer: Self-pay | Admitting: Family Medicine

## 2018-03-13 ENCOUNTER — Encounter: Payer: Self-pay | Admitting: Family Medicine

## 2018-03-16 NOTE — Progress Notes (Signed)
[name] [place], [place] [address] Phone: [phone] Subjective:   Kendra [name], am serving as a scribe for Dr. Hulan Saas.  I'm seeing this patient by the request  of:    CC: Back and leg pain  ALP:[id number]  Kendra [name] is a 47 y.o. female coming in with complaint of hip and leg pain. She ran a 10k recently and felt pain in the hips and knees. Had to run walk the race. Did have tingling in right calf. Is feeling better since race but still has pain.  Patient was sent for an MRI.  MRI pelvis did not show the patient did have a gluteus medius tendon tear with intrasubstance tearing as well as some tendinitis.  Back MRI was also independently visualized by me with very small protruding disc but [name] nerve impingement.      Past Medical History:  Diagnosis Date  . Allergy   . Breast cancer ([place]) left   2004 with chemo tx  . Osteoporosis    Past Surgical History:  Procedure Laterality Date  . BREAST SURGERY     implant left in 2004, and biopsy  . MASTECTOMY Left 01/07/03   with tram flap and right breast lift in 2008  . TOTAL ABDOMINAL HYSTERECTOMY     related to breast cancer  . TOTAL ABDOMINAL HYSTERECTOMY W/ BILATERAL SALPINGOOPHORECTOMY  10/2003   Social History   Socioeconomic History  . Marital status: Married    Spouse name: [name]  . Number of children: 2  . Years of education: Not on file  . Highest education level: Not on file  Occupational History  . Not on file  Social Needs  . Financial resource strain: Not on file  . Food insecurity:    Worry: Not on file    Inability: Not on file  . Transportation needs:    Medical: Not on file    Non-medical: Not on file  Tobacco Use  . Smoking status: Never Smoker  . Smokeless tobacco: Never Used  Substance and Sexual Activity  . Alcohol use: [name]  . Drug use: [name]  . Sexual activity: Yes    Birth control/protection: Other-see comments    Comment: had total  hysterectomy  Lifestyle  . Physical activity:    Days per week: Not on file    Minutes per session: Not on file  . Stress: Not on file  Relationships  . Social connections:    Talks on phone: Not on file    Gets together: Not on file    Attends religious service: Not on file    Active member of club or organization: Not on file    Attends meetings of clubs or organizations: Not on file    Relationship status: Not on file  Other Topics Concern  . Not on file  Social History Narrative  . Not on file   [name] Known Allergies Family History  Problem Relation Age of Onset  . Arthritis Mother   . Hypothyroidism Mother   . Breast cancer Mother 22  . Alcohol abuse Brother      Current Outpatient Medications (Cardiovascular):  .  nitroGLYCERIN (NITRODUR - DOSED IN MG/24 HR) 0.2 mg/hr patch, 1/4 patch daily  Current Outpatient Medications (Respiratory):  Marland Kitchen  ALLERGY RELIEF/NASAL DECONGEST 10-240 MG 24 hr tablet, TAKE ONE TABLET BY MOUTH DAILY .  fluticasone (FLONASE) 50 MCG/ACT nasal spray, PLACE 1 SPRAY INTO BOTH NOSTRILS DAILY.  Current Outpatient Medications (Other):  Marland Kitchen  Calcium-Vitamin D 600-200 MG-UNIT per tablet, Take 1 tablet by mouth daily.  .  sertraline (ZOLOFT) 25 MG tablet, TAKE 1 TABLET (25 MG TOTAL) BY MOUTH DAILY. .  valACYclovir (VALTREX) 1000 MG tablet, TAKE 2 TABLETS BY MOUTH TWICE A DAY .  VITAMIN D, CHOLECALCIFEROL, PO, Take 1 capsule by mouth daily. .  Vitamin D, Ergocalciferol, (DRISDOL) 50000 units CAPS capsule, Take 1 capsule (50,000 Units total) by mouth every 7 (seven) days.    Past medical history, social, surgical and family history all reviewed in electronic medical record.  [name] pertanent information unless stated regarding to the chief complaint.   Review of Systems:  [name] headache, visual changes, nausea, vomiting, diarrhea, constipation, dizziness, abdominal pain, skin rash, fevers, chills, night sweats, weight loss, swollen lymph nodes, body aches,  joint swelling, muscle aches, chest pain, shortness of breath, mood changes.   Objective  Blood pressure (!) 128/24, pulse 78, height 5' 7.5" (1.715 m), weight 148 lb (67.1 kg), SpO2 98 %.    General: [name] apparent distress alert and oriented x3 mood and affect normal, dressed appropriately.  HEENT: Pupils equal, extraocular movements intact  Respiratory: Patient's speak in full sentences and does not appear short of breath  Cardiovascular: [name] lower extremity edema, non tender, [name] erythema  Skin: Warm dry intact with [name] signs of infection or rash on extremities or on axial skeleton.  Abdomen: Soft nontender  Neuro: Cranial nerves II through XII are intact, neurovascularly intact in all extremities with 2+ DTRs and 2+ pulses.  Lymph: [name] lymphadenopathy of posterior or anterior cervical chain or axillae bilaterally.  Gait normal with good balance and coordination.  MSK:  Non tender with full range of motion and good stability and symmetric strength and tone of shoulders, elbows, wrist, hip, knee and ankles bilaterally.  Back Exam:  Inspection: Mild loss of lordosis Motion: Flexion 40 deg, Extension 25 deg, Side Bending to 40 deg bilaterally,  Rotation to 35 deg bilaterally  SLR laying: Negative  XSLR laying: Negative  Palpable tenderness: Tender over the right. FABER: negative. Sensory change: Gross sensation intact to all lumbar and sacral dermatomes.  Reflexes: 2+ at both patellar tendons, 2+ at achilles tendons, Babinski's downgoing.  Strength at foot  Plantar-flexion: 5/5 Dorsi-flexion: 5/5 Eversion: 5/5 Inversion: 5/5  Leg strength  Quad: 5/5 Hamstring: 5/5 Hip flexor: 5/5 Hip abductors: 5/5  Gait unremarkable.  Osteopathic findings C2 flexed rotated and side bent right C4 flexed rotated and side bent left C6 flexed rotated and side bent left T3 extended rotated and side bent right inhaled third rib T9 extended rotated and side bent left L2 flexed rotated and side bent  right Sacrum right on right   Procedure: Real-time Ultrasound Guided Injection of right gluteal tendon sheath Device: GE Logiq Q7 Ultrasound guided injection is preferred based studies that show increased duration, increased effect, greater accuracy, decreased procedural pain, increased response rate, and decreased cost with ultrasound guided versus blind injection.  Verbal informed consent obtained.  Time-out conducted.  Noted [name] overlying erythema, induration, or other signs of local infection.  Skin prepped in a sterile fashion.  Local anesthesia: Topical Ethyl chloride.  With sterile technique and under real time ultrasound guidance: With a 21-gauge 2 inch needle patient was injected with 0.5 cc of 0.5% Marcaine and 0.5 cc of Kenalog 40 mg/mL into the gluteal tendon sheath. Completed without difficulty  Pain immediately resolved suggesting accurate placement of the medication.  Advised to  call if fevers/chills, erythema, induration, drainage, or persistent bleeding.  Images permanently stored and available for review in the ultrasound unit.  Impression: Technically successful ultrasound guided injection.    Impression and Recommendations:     This case required medical decision making of moderate complexity. The above documentation has been reviewed and is accurate and complete Lyndal Pulley, DO       Note: This dictation was prepared with Dragon dictation along with smaller phrase technology. Any transcriptional errors that result from this process are unintentional.

## 2018-03-18 ENCOUNTER — Ambulatory Visit (INDEPENDENT_AMBULATORY_CARE_PROVIDER_SITE_OTHER): Payer: 59 | Admitting: Family Medicine

## 2018-03-18 ENCOUNTER — Ambulatory Visit: Payer: Self-pay

## 2018-03-18 ENCOUNTER — Encounter: Payer: Self-pay | Admitting: Family Medicine

## 2018-03-18 VITALS — BP 128/24 | HR 78 | Ht 67.5 in | Wt 148.0 lb

## 2018-03-18 DIAGNOSIS — M76899 Other specified enthesopathies of unspecified lower limb, excluding foot: Secondary | ICD-10-CM | POA: Diagnosis not present

## 2018-03-18 DIAGNOSIS — M999 Biomechanical lesion, unspecified: Secondary | ICD-10-CM | POA: Diagnosis not present

## 2018-03-18 DIAGNOSIS — M9903 Segmental and somatic dysfunction of lumbar region: Secondary | ICD-10-CM

## 2018-03-18 DIAGNOSIS — M9908 Segmental and somatic dysfunction of rib cage: Secondary | ICD-10-CM

## 2018-03-18 DIAGNOSIS — M25551 Pain in right hip: Secondary | ICD-10-CM | POA: Diagnosis not present

## 2018-03-18 DIAGNOSIS — M7601 Gluteal tendinitis, right hip: Secondary | ICD-10-CM | POA: Diagnosis not present

## 2018-03-18 DIAGNOSIS — M9902 Segmental and somatic dysfunction of thoracic region: Secondary | ICD-10-CM | POA: Diagnosis not present

## 2018-03-18 DIAGNOSIS — M9904 Segmental and somatic dysfunction of sacral region: Secondary | ICD-10-CM | POA: Diagnosis not present

## 2018-03-18 DIAGNOSIS — M9901 Segmental and somatic dysfunction of cervical region: Secondary | ICD-10-CM

## 2018-03-18 MED ORDER — NITROGLYCERIN 0.2 MG/HR TD PT24: MEDICATED_PATCH | TRANSDERMAL | 1 refills | Status: DC

## 2018-03-18 NOTE — Assessment & Plan Note (Signed)
Patient given injection today.  Tolerated the procedure well.  Discussed icing regimen and home exercises.  Discussed which activities doing which was to avoid.  Increase activity slowly over the course the next several days.  Patient will follow-up with me again in 4 to 6 weeks.

## 2018-03-18 NOTE — Assessment & Plan Note (Signed)
Continues to give patient some difficulty.  We discussed icing regimen and home exercises.  Discussed which activities to do which wants to avoid.  Discussed that we need to avoid high impact.  Treat more like a sports hernia.  Follow-up again in 4 weeks

## 2018-03-18 NOTE — Assessment & Plan Note (Signed)
Decision today to treat with OMT was based on Physical Exam  After verbal consent patient was treated with HVLA, ME, FPR techniques in cervical, thoracic, rib lumbar and sacral areas  Patient tolerated the procedure well with improvement in symptoms  Patient given exercises, stretches and lifestyle modifications  See medications in patient instructions if given  Patient will follow up in 4-8 weeks 

## 2018-03-18 NOTE — Patient Instructions (Addendum)
Good to see you  [name] is your friend.  The sports hernia can take some time.  The back looks good which is great  Injected the glute tendon today  Nitroglycerin Protocol   Apply 1/4 nitroglycerin patch to affected area daily.  Change position of patch within the affected area every 24 hours.   You may experience a headache during the first 1-2 weeks of using the patch, these should subside.  If you experience headaches after beginning nitroglycerin patch treatment, you may take your preferred over the counter pain reliever.  Another side effect of the nitroglycerin patch is skin irritation or rash related to patch adhesive.  Please notify our office if you develop more severe headaches or rash, and stop the patch.  Tendon healing with nitroglycerin patch may require 12 to 24 weeks depending on the extent of injury.  Men should not use if taking Viagra, Cialis, or Levitra.   Do not use if you have migraines or rosacea.Next4 weeks we need to take it easy with no running or jumping to give everything time to heal.  See me againin 4 weeks and we will make sure you are doing well

## 2018-03-25 ENCOUNTER — Encounter: Payer: Self-pay | Admitting: Family Medicine

## 2018-03-29 ENCOUNTER — Ambulatory Visit: Payer: 59 | Admitting: Family Medicine

## 2018-04-15 ENCOUNTER — Encounter: Payer: Self-pay | Admitting: Physician Assistant

## 2018-04-16 NOTE — Progress Notes (Signed)
[name] [address], [place] [address] Phone: [phone] Subjective:    I Kendra [name] am serving as a Education administrator for Dr. Hulan Saas.   I'm seeing this patient by the request  of:    CC: Include pain follow-up and back pain follow-up  [id number]  Kendra [name] is a 47 y.o. female coming in with complaint of hip pain. States that it is getting better. Only works out 2 days a week. Has been using nitro. Working out causes pain.  Patient states after the injection she was about 90 to 100% better for approximately 2 weeks and now the pain is starting to come back again.  Gluteal tendinitis with intrasubstance tearing noted on MRI previously that was independently visualized by me.-     Past Medical History:  Diagnosis Date  . Allergy   . Breast cancer ([place]) left   2004 with chemo tx  . Osteoporosis    Past Surgical History:  Procedure Laterality Date  . BREAST SURGERY     implant left in 2004, and biopsy  . MASTECTOMY Left 01/07/03   with tram flap and right breast lift in 2008  . TOTAL ABDOMINAL HYSTERECTOMY     related to breast cancer  . TOTAL ABDOMINAL HYSTERECTOMY W/ BILATERAL SALPINGOOPHORECTOMY  10/2003   Social History   Socioeconomic History  . Marital status: Married    Spouse name: Coralyn Roselli  . Number of children: 2  . Years of education: Not on file  . Highest education level: Not on file  Occupational History  . Not on file  Social Needs  . Financial resource strain: Not on file  . Food insecurity:    Worry: Not on file    Inability: Not on file  . Transportation needs:    Medical: Not on file    Non-medical: Not on file  Tobacco Use  . Smoking status: Never Smoker  . Smokeless tobacco: Never Used  Substance and Sexual Activity  . Alcohol use: No  . Drug use: No  . Sexual activity: Yes    Birth control/protection: Other-see comments    Comment: had total hysterectomy  Lifestyle  . Physical  activity:    Days per week: Not on file    Minutes per session: Not on file  . Stress: Not on file  Relationships  . Social connections:    Talks on phone: Not on file    Gets together: Not on file    Attends religious service: Not on file    Active member of club or organization: Not on file    Attends meetings of clubs or organizations: Not on file    Relationship status: Not on file  Other Topics Concern  . Not on file  Social History Narrative  . Not on file   No Known Allergies Family History  Problem Relation Age of Onset  . Arthritis Mother   . Hypothyroidism Mother   . Breast cancer Mother 57  . Alcohol abuse Brother      Current Outpatient Medications (Cardiovascular):  .  nitroGLYCERIN (NITRODUR - DOSED IN MG/24 HR) 0.2 mg/hr patch, 1/4 patch daily  Current Outpatient Medications (Respiratory):  Marland Kitchen  ALLERGY RELIEF/NASAL DECONGEST 10-240 MG 24 hr tablet, TAKE ONE TABLET BY MOUTH DAILY .  fluticasone (FLONASE) 50 MCG/ACT nasal spray, PLACE 1 SPRAY INTO BOTH NOSTRILS DAILY.    Current Outpatient Medications (Other):  Marland Kitchen  Calcium-Vitamin D 600-200 MG-UNIT per tablet, Take  1 tablet by mouth daily.  .  sertraline (ZOLOFT) 25 MG tablet, TAKE 1 TABLET (25 MG TOTAL) BY MOUTH DAILY. .  valACYclovir (VALTREX) 1000 MG tablet, TAKE 2 TABLETS BY MOUTH TWICE A DAY .  VITAMIN D, CHOLECALCIFEROL, PO, Take 1 capsule by mouth daily. .  Vitamin D, Ergocalciferol, (DRISDOL) 50000 units CAPS capsule, Take 1 capsule (50,000 Units total) by mouth every 7 (seven) days.    Past medical history, social, surgical and family history all reviewed in electronic medical record.  No pertanent information unless stated regarding to the chief complaint.   Review of Systems:  No headache, visual changes, nausea, vomiting, diarrhea, constipation, dizziness, abdominal pain, skin rash, fevers, chills, night sweats, weight loss, swollen lymph nodes, body aches, joint swelling,  chest pain, shortness  of breath, mood changes.  Positive muscle aches  Objective  Blood pressure 116/70, pulse 78, height 5' 7.5" (1.715 m), weight 147 lb (66.7 kg), SpO2 98 %.    General: No apparent distress alert and oriented x3 mood and affect normal, dressed appropriately.  HEENT: Pupils equal, extraocular movements intact  Respiratory: Patient's speak in full sentences and does not appear short of breath  Cardiovascular: No lower extremity edema, non tender, no erythema  Skin: Warm dry intact with no signs of infection or rash on extremities or on axial skeleton.  Abdomen: Soft nontender  Neuro: Cranial nerves II through XII are intact, neurovascularly intact in all extremities with 2+ DTRs and 2+ pulses.  Lymph: No lymphadenopathy of posterior or anterior cervical chain or axillae bilaterally.  Gait normal with good balance and coordination.  MSK:  Non tender with full range of motion and good stability and symmetric strength and tone of shoulders, elbows, wrist, hip, knee and ankles bilaterally.  Back Exam:  Inspection: Loss of lordosis Motion: Flexion 45 deg, Extension 25 deg, Side Bending to 35 deg bilaterally,  Rotation to 45 deg bilaterally  SLR laying: Negative  XSLR laying: Negative  Palpable tenderness: Tender to palpation the paraspinal musculature lumbar spine right greater than left. FABER: Positive bilaterally. Sensory change: Gross sensation intact to all lumbar and sacral dermatomes.  Reflexes: 2+ at both patellar tendons, 2+ at achilles tendons, Babinski's downgoing.  Strength at foot  Plantar-flexion: 5/5 Dorsi-flexion: 5/5 Eversion: 5/5 Inversion: 5/5  Leg strength  Quad: 5/5 Hamstring: 5/5 Hip flexor: 5/5 Hip abductors: 5/5  Gait unremarkable.  Osteopathic findings C6 flexed rotated and side bent left T5 extended rotated and side bent right inhaled rib T8 extended rotated and side bent left L1 flexed rotated and side bent right Sacrum right on right    Impression and  Recommendations:     This case required medical decision making of moderate complexity. The above documentation has been reviewed and is accurate and complete Lyndal Pulley, DO       Note: This dictation was prepared with Dragon dictation along with smaller phrase technology. Any transcriptional errors that result from this process are unintentional.

## 2018-04-18 ENCOUNTER — Ambulatory Visit (INDEPENDENT_AMBULATORY_CARE_PROVIDER_SITE_OTHER): Payer: 59 | Admitting: Family Medicine

## 2018-04-18 ENCOUNTER — Encounter: Payer: Self-pay | Admitting: Family Medicine

## 2018-04-18 VITALS — BP 116/70 | HR 78 | Ht 67.5 in | Wt 147.0 lb

## 2018-04-18 DIAGNOSIS — M9902 Segmental and somatic dysfunction of thoracic region: Secondary | ICD-10-CM

## 2018-04-18 DIAGNOSIS — M9908 Segmental and somatic dysfunction of rib cage: Secondary | ICD-10-CM

## 2018-04-18 DIAGNOSIS — M999 Biomechanical lesion, unspecified: Secondary | ICD-10-CM

## 2018-04-18 DIAGNOSIS — M9903 Segmental and somatic dysfunction of lumbar region: Secondary | ICD-10-CM

## 2018-04-18 DIAGNOSIS — M9904 Segmental and somatic dysfunction of sacral region: Secondary | ICD-10-CM

## 2018-04-18 DIAGNOSIS — M9901 Segmental and somatic dysfunction of cervical region: Secondary | ICD-10-CM | POA: Diagnosis not present

## 2018-04-18 DIAGNOSIS — M7601 Gluteal tendinitis, right hip: Secondary | ICD-10-CM | POA: Diagnosis not present

## 2018-04-18 NOTE — Patient Instructions (Addendum)
Great to see you  Kendra [name] is your  Friend  PRP on the 27th please schedule up front thank you  You will do great

## 2018-04-18 NOTE — Assessment & Plan Note (Signed)
Patient did have 90 to 100% improvement initially.  Pain is starting to come back.  Will send patient home from PRP injections.  Patient is excited about the possibility.  Continue all vitamins, home exercises and icing regimen in the interim

## 2018-04-18 NOTE — Assessment & Plan Note (Signed)
Decision today to treat with OMT was based on Physical Exam  After verbal consent patient was treated with HVLA, ME, FPR techniques in cervical, thoracic, rib, lumbar and sacral areas  Patient tolerated the procedure well with improvement in symptoms  Patient given exercises, stretches and lifestyle modifications  See medications in patient instructions if given  Patient will follow up in 4 weeks 

## 2018-05-07 NOTE — Progress Notes (Signed)
[name] [address], [place] [address] Phone: [phone] Subjective:    I'm seeing this patient by the request  of:  CC: Gluteal tendon follow-up  [id number]  Kendra [name] is a 47 y.o. female coming in with complaint of pain.  Was found to have a gluteal tendon tear.  This was confirmed on MRI.  Responded initially to steroid injection but pain started coming back.  Here for PRP injection.    Past Medical History:  Diagnosis Date  . Allergy   . Breast cancer ([place]) left   2004 with chemo tx  . Osteoporosis    Past Surgical History:  Procedure Laterality Date  . BREAST SURGERY     implant left in 2004, and biopsy  . MASTECTOMY Left 01/07/03   with tram flap and right breast lift in 2008  . TOTAL ABDOMINAL HYSTERECTOMY     related to breast cancer  . TOTAL ABDOMINAL HYSTERECTOMY W/ BILATERAL SALPINGOOPHORECTOMY  10/2003   Social History   Socioeconomic History  . Marital status: Married    Spouse name: Jahnae Mcadoo  . Number of children: 2  . Years of education: Not on file  . Highest education level: Not on file  Occupational History  . Not on file  Social Needs  . Financial resource strain: Not on file  . Food insecurity:    Worry: Not on file    Inability: Not on file  . Transportation needs:    Medical: Not on file    Non-medical: Not on file  Tobacco Use  . Smoking status: Never Smoker  . Smokeless tobacco: Never Used  Substance and Sexual Activity  . Alcohol use: No  . Drug use: No  . Sexual activity: Yes    Birth control/protection: Other-see comments    Comment: had total hysterectomy  Lifestyle  . Physical activity:    Days per week: Not on file    Minutes per session: Not on file  . Stress: Not on file  Relationships  . Social connections:    Talks on phone: Not on file    Gets together: Not on file    Attends religious service: Not on file    Active member of club or organization: Not on file   Attends meetings of clubs or organizations: Not on file    Relationship status: Not on file  Other Topics Concern  . Not on file  Social History Narrative  . Not on file   No Known Allergies Family History  Problem Relation Age of Onset  . Arthritis Mother   . Hypothyroidism Mother   . Breast cancer Mother 73  . Alcohol abuse Brother      Current Outpatient Medications (Cardiovascular):  .  nitroGLYCERIN (NITRODUR - DOSED IN MG/24 HR) 0.2 mg/hr patch, 1/4 patch daily  Current Outpatient Medications (Respiratory):  Marland Kitchen  ALLERGY RELIEF/NASAL DECONGEST 10-240 MG 24 hr tablet, TAKE ONE TABLET BY MOUTH DAILY .  fluticasone (FLONASE) 50 MCG/ACT nasal spray, PLACE 1 SPRAY INTO BOTH NOSTRILS DAILY.    Current Outpatient Medications (Other):  Marland Kitchen  Calcium-Vitamin D 600-200 MG-UNIT per tablet, Take 1 tablet by mouth daily.  .  sertraline (ZOLOFT) 25 MG tablet, TAKE 1 TABLET (25 MG TOTAL) BY MOUTH DAILY. .  valACYclovir (VALTREX) 1000 MG tablet, TAKE 2 TABLETS BY MOUTH TWICE A DAY .  VITAMIN D, CHOLECALCIFEROL, PO, Take 1 capsule by mouth daily. .  Vitamin D, Ergocalciferol, (DRISDOL) 50000 units  CAPS capsule, Take 1 capsule (50,000 Units total) by mouth every 7 (seven) days.    Past medical history, social, surgical and family history all reviewed in electronic medical record.  No pertanent information unless stated regarding to the chief complaint.   Review of Systems:  No headache, visual changes, nausea, vomiting, diarrhea, constipation, dizziness, abdominal pain, skin rash, fevers, chills, night sweats, weight loss, swollen lymph nodes, body aches, joint swelling,  chest pain, shortness of breath, mood changes.  Positive muscle aches  Objective  Height 5' 7.5" (1.715 m).    General: No apparent distress alert and oriented x3 mood and affect normal, dressed appropriately.    Procedure: Real-time Ultrasound Guided Injection of right gluteal tendon sheath Device: GE Logiq  Q7 Ultrasound guided injection is preferred based studies that show increased duration, increased effect, greater accuracy, decreased procedural pain, increased response rate, and decreased cost with ultrasound guided versus blind injection.  Verbal informed consent obtained.  Time-out conducted.  Noted no overlying erythema, induration, or other signs of local infection.  Skin prepped in a sterile fashion.  Local anesthesia: Topical Ethyl chloride.  With sterile technique and under real time ultrasound guidance: With a 21-gauge 2 inch needle injected with 2 cc of 0.5% Marcaine.  Then injected with 5 cc of pre-centrifuge PRP into the tendon sheath. Completed without difficulty  Pain immediately resolved suggesting accurate placement of the medication.  Advised to call if fevers/chills, erythema, induration, drainage, or persistent bleeding.  Images permanently stored and available for review in the ultrasound unit.  Impression: Technically successful ultrasound guided injection.     Impression and Recommendations:     This case required medical decision making of moderate complexity. The above documentation has been reviewed and is accurate and complete Lyndal Pulley, DO       Note: This dictation was prepared with Dragon dictation along with smaller phrase technology. Any transcriptional errors that result from this process are unintentional.

## 2018-05-08 ENCOUNTER — Ambulatory Visit: Payer: Self-pay

## 2018-05-08 ENCOUNTER — Ambulatory Visit (INDEPENDENT_AMBULATORY_CARE_PROVIDER_SITE_OTHER): Payer: Self-pay | Admitting: Family Medicine

## 2018-05-08 VITALS — Ht 67.5 in

## 2018-05-08 DIAGNOSIS — M25551 Pain in right hip: Secondary | ICD-10-CM

## 2018-05-08 DIAGNOSIS — M7601 Gluteal tendinitis, right hip: Secondary | ICD-10-CM

## 2018-05-08 NOTE — Assessment & Plan Note (Signed)
PRP done today.  Discussed icing regimen and home exercises.  Discussed which activities to do which wants to avoid.  Increase activity slowly over the course of neck several days.  Discussed icing regimen and home exercises.  Follow-up again in 4 to 8 weeks.

## 2018-05-08 NOTE — Patient Instructions (Signed)
See you in 3-4 weeks!

## 2018-06-07 ENCOUNTER — Ambulatory Visit: Payer: Self-pay | Admitting: Physician Assistant

## 2018-06-07 ENCOUNTER — Telehealth: Payer: 59 | Admitting: Family

## 2018-06-07 VITALS — BP 100/60 | HR 89 | Temp 98.7°F | Resp 18 | Wt 148.0 lb

## 2018-06-07 DIAGNOSIS — J0100 Acute maxillary sinusitis, unspecified: Secondary | ICD-10-CM

## 2018-06-07 DIAGNOSIS — J329 Chronic sinusitis, unspecified: Secondary | ICD-10-CM | POA: Diagnosis not present

## 2018-06-07 DIAGNOSIS — B9789 Other viral agents as the cause of diseases classified elsewhere: Secondary | ICD-10-CM | POA: Diagnosis not present

## 2018-06-07 DIAGNOSIS — H6691 Otitis media, unspecified, right ear: Secondary | ICD-10-CM

## 2018-06-07 MED ORDER — AMOXICILLIN-POT CLAVULANATE 875-125 MG PO TABS: 1.0000 | ORAL_TABLET | Freq: Two times a day (BID) | ORAL | 0 refills | Status: AC

## 2018-06-07 NOTE — Progress Notes (Signed)
Thank you for the details you included in the comment boxes. Those details are very helpful in determining the best course of treatment for you and help [place] to provide the best care.  Providers prescribe antibiotics to treat infections caused by bacteria. Antibiotics are very powerful in treating bacterial infections when they are used properly. To maintain their effectiveness, they should be used only when necessary. Overuse of antibiotics has resulted in the development of superbugs that are resistant to treatment!    After careful review of your answers, I would not recommend an antibiotic for your condition.  Antibiotics are not effective against viruses and therefore should not be used to treat them. Common examples of infections caused by viruses include colds and flu. Antibiotics can be considered closer to 7-10 days.  We are sorry that you are not feeling well.  Here is how we plan to help!  Based on what you have shared with me it looks like you have sinusitis.  Sinusitis is inflammation and infection in the sinus cavities of the head.  Based on your presentation I believe you most likely have Acute Viral Sinusitis.This is an infection most likely caused by a virus. There is not specific treatment for viral sinusitis other than to help you with the symptoms until the infection runs its course.  You may use an oral decongestant such as Mucinex D or if you have glaucoma or high blood pressure use plain Mucinex. Saline nasal spray help and can safely be used as often as needed for congestion, I have prescribed: Fluticasone nasal spray two sprays in each nostril once a day  Some authorities believe that zinc sprays or the use of Echinacea may shorten the course of your symptoms.  Sinus infections are not as easily transmitted as other respiratory infection, however we still recommend that you avoid close contact with loved ones, especially the very young and elderly.  Remember to wash your hands  thoroughly throughout the day as this is the number one way to prevent the spread of infection!  Home Care:  Only take medications as instructed by your medical team.  Do not take these medications with alcohol.  A steam or ultrasonic humidifier can help congestion.  You can place a towel over your head and breathe in the steam from hot water coming from a faucet.  Avoid close contacts especially the very young and the elderly.  Cover your mouth when you cough or sneeze.  Always remember to wash your hands.  Get Help Right Away If:  You develop worsening fever or sinus pain.  You develop a severe head ache or visual changes.  Your symptoms persist after you have completed your treatment plan.  Make sure you  Understand these instructions.  Will watch your condition.  Will get help right away if you are not doing well or get worse.  Your e-visit answers were reviewed by a board certified advanced clinical practitioner to complete your personal care plan.  Depending on the condition, your plan could have included both over the counter or prescription medications.  If there is a problem please reply  once you have received a response from your provider.  Your safety is important to [place].  If you have drug allergies check your prescription carefully.    You can use MyChart to ask questions about today's visit, request a non-urgent call back, or ask for a work or school excuse for 24 hours related to this e-Visit. If it has been  greater than 24 hours you will need to follow up with your provider, or enter a new e-Visit to address those concerns.  You will get an e-mail in the next two days asking about your experience.  I hope that your e-visit has been valuable and will speed your recovery. Thank you for using e-visits.
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
## 2018-06-07 NOTE — Patient Instructions (Signed)
Thank you for choosing [facility] for your health care needs.  You have been diagnosed with right otitis media (right ear infection) and sinusitis (sinus infection).  Increase fluids; water, Gatorade, hot tea with lemon/honey, orange juice. Rest. Use cool mist humidifier in bedroom. Apply hot/warm compresses over sinuses.  Continue Flonase nasal spray. Continue Claritin-D. Continue to take over the counter Tylenol or ibuprofen for pain/discomfort.  You have been prescribed an antibiotic, Augmentin. Take with food to prevent stomach upset. Eat yogurt or take an over the counter probiotic while on antibiotic.  Follow-up with Leodis Binet, your family physician, or urgent care in 2-3 days if not feeling any better; sooner with any worsening symptoms.  Hope you feel better soon!  Otitis Media, Adult  Otitis media means that the middle ear is red and swollen (inflamed) and full of fluid. The condition usually goes away on its own. Follow these instructions at home:  Take over-the-counter and prescription medicines only as told by your doctor.  If you were prescribed an antibiotic medicine, take it as told by your doctor. Do not stop taking the antibiotic even if you start to feel better.  Keep all follow-up visits as told by your doctor. This is important. Contact a doctor if:  You have bleeding from your nose.  There is a lump on your neck.  You are not getting better in 5 days.  You feel worse instead of better. Get help right away if:  You have pain that is not helped with medicine.  You have swelling, redness, or pain around your ear.  You get a stiff neck.  You cannot move part of your face (paralyzed).  You notice that the bone behind your ear hurts when you touch it.  You get a very bad headache. Summary  Otitis media means that the middle ear is red, swollen, and full of fluid.  This condition usually goes away on its own. In some cases, treatment may be  needed.  If you were prescribed an antibiotic medicine, take it as told by your doctor. This information is not intended to replace advice given to you by your health care provider. Make sure you discuss any questions you have with your health care provider. Document Released: 11/15/2007 Document Revised: 06/19/2016 Document Reviewed: 06/19/2016 Elsevier Interactive Patient Education  2019 [employer].

## 2018-06-09 NOTE — Progress Notes (Signed)
[name] [address] [place], [place] [address] Phone: [phone] Subjective:    I [name] am serving as a Education administrator for Dr. Hulan Saas.   CC: Back and gluteal pain follow-up  ION:[id number]  Kendra [name] is a 47 y.o. female coming in with complaint of right hip pain. States that everything is going pretty good. Hip has been a lot better. Stretching and exercise helps.  Patient was last seen and was given PRP 3 weeks ago.  Making significant progress.  Still some mild discomfort and pain.  Patient though states approximately 70 to 80% better.      Past Medical History:  Diagnosis Date  . Allergy   . Breast cancer ([place]) left   2004 with chemo tx  . Osteoporosis    Past Surgical History:  Procedure Laterality Date  . BREAST SURGERY     implant left in 2004, and biopsy  . MASTECTOMY Left 01/07/03   with tram flap and right breast lift in 2008  . TOTAL ABDOMINAL HYSTERECTOMY     related to breast cancer  . TOTAL ABDOMINAL HYSTERECTOMY W/ BILATERAL SALPINGOOPHORECTOMY  10/2003   Social History   Socioeconomic History  . Marital status: Married    Spouse name: Riko Lumsden  . Number of children: 2  . Years of education: Not on file  . Highest education level: Not on file  Occupational History  . Not on file  Social Needs  . Financial resource strain: Not on file  . Food insecurity:    Worry: Not on file    Inability: Not on file  . Transportation needs:    Medical: Not on file    Non-medical: Not on file  Tobacco Use  . Smoking status: Never Smoker  . Smokeless tobacco: Never Used  Substance and Sexual Activity  . Alcohol use: No  . Drug use: No  . Sexual activity: Yes    Birth control/protection: Other-see comments    Comment: had total hysterectomy  Lifestyle  . Physical activity:    Days per week: Not on file    Minutes per session: Not on file  . Stress: Not on file  Relationships  . Social connections:    Talks  on phone: Not on file    Gets together: Not on file    Attends religious service: Not on file    Active member of club or organization: Not on file    Attends meetings of clubs or organizations: Not on file    Relationship status: Not on file  Other Topics Concern  . Not on file  Social History Narrative  . Not on file   No Known Allergies Family History  Problem Relation Age of Onset  . Arthritis Mother   . Hypothyroidism Mother   . Breast cancer Mother 40  . Alcohol abuse Brother      Current Outpatient Medications (Cardiovascular):  .  nitroGLYCERIN (NITRODUR - DOSED IN MG/24 HR) 0.2 mg/hr patch, 1/4 patch daily  Current Outpatient Medications (Respiratory):  .  fluticasone (FLONASE) 50 MCG/ACT nasal spray, PLACE 1 SPRAY INTO BOTH NOSTRILS DAILY. Marland Kitchen  Loratadine-Pseudoephedrine (LORATADINE-D 12HR PO), Take by mouth.    Current Outpatient Medications (Other):  .  amoxicillin-clavulanate (AUGMENTIN) 875-125 MG tablet, Take 1 tablet by mouth 2 (two) times daily for 7 days. .  Calcium-Vitamin D 600-200 MG-UNIT per tablet, Take 1 tablet by mouth daily.  .  sertraline (ZOLOFT) 25 MG tablet, TAKE 1  TABLET (25 MG TOTAL) BY MOUTH DAILY. .  valACYclovir (VALTREX) 1000 MG tablet, TAKE 2 TABLETS BY MOUTH TWICE A DAY .  VITAMIN D, CHOLECALCIFEROL, PO, Take 1 capsule by mouth daily. .  Vitamin D, Ergocalciferol, (DRISDOL) 50000 units CAPS capsule, Take 1 capsule (50,000 Units total) by mouth every 7 (seven) days.    Past medical history, social, surgical and family history all reviewed in electronic medical record.  No pertanent information unless stated regarding to the chief complaint.   Review of Systems:  No headache, visual changes, nausea, vomiting, diarrhea, constipation, dizziness, abdominal pain, skin rash, fevers, chills, night sweats, weight loss, swollen lymph nodes, body aches, joint swelling,, chest pain, shortness of breath, mood changes.  Positive muscle  aches  Objective  Blood pressure 110/70, pulse 61, height 5' 7.5" (1.715 m), weight 150 lb (68 kg), SpO2 98 %.   General: No apparent distress alert and oriented x3 mood and affect normal, dressed appropriately.  HEENT: Pupils equal, extraocular movements intact  Respiratory: Patient's speak in full sentences and does not appear short of breath  Cardiovascular: No lower extremity edema, non tender, no erythema  Skin: Warm dry intact with no signs of infection or rash on extremities or on axial skeleton.  Abdomen: Soft nontender  Neuro: Cranial nerves II through XII are intact, neurovascularly intact in all extremities with 2+ DTRs and 2+ pulses.  Lymph: No lymphadenopathy of posterior or anterior cervical chain or axillae bilaterally.  Gait normal with good balance and coordination.  MSK:  Non tender with full range of motion and good stability and symmetric strength and tone of shoulders, elbows, wrist, , knee and ankles bilaterally.  Right hip exam shows the patient has improvement in range of motion.  Patient does still have some mild tenderness over the gluteal insertion on the lateral hip.  Negative straight leg test.  Tightness of the lower back.  Paraspinal muscular tenderness noted of the lumbar spine.  Negative straight leg test.  Osteopathic findings C6 flexed rotated and side bent left T3 extended rotated and side bent right inhaled third rib T9 extended rotated and side bent left L4 flexed rotated and side bent left  Sacrum right on right    Impression and Recommendations:     This case required medical decision making of moderate complexity. The above documentation has been reviewed and is accurate and complete Lyndal Pulley, DO       Note: This dictation was prepared with Dragon dictation along with smaller phrase technology. Any transcriptional errors that result from this process are unintentional.

## 2018-06-10 ENCOUNTER — Ambulatory Visit (INDEPENDENT_AMBULATORY_CARE_PROVIDER_SITE_OTHER): Payer: 59 | Admitting: Family Medicine

## 2018-06-10 ENCOUNTER — Encounter: Payer: Self-pay | Admitting: Family Medicine

## 2018-06-10 VITALS — BP 110/70 | HR 61 | Ht 67.5 in | Wt 150.0 lb

## 2018-06-10 DIAGNOSIS — M9982 Other biomechanical lesions of thoracic region: Secondary | ICD-10-CM | POA: Diagnosis not present

## 2018-06-10 DIAGNOSIS — M9988 Other biomechanical lesions of rib cage: Secondary | ICD-10-CM | POA: Diagnosis not present

## 2018-06-10 DIAGNOSIS — M9983 Other biomechanical lesions of lumbar region: Secondary | ICD-10-CM

## 2018-06-10 DIAGNOSIS — M999 Biomechanical lesion, unspecified: Secondary | ICD-10-CM

## 2018-06-10 DIAGNOSIS — M7601 Gluteal tendinitis, right hip: Secondary | ICD-10-CM

## 2018-06-10 DIAGNOSIS — M9984 Other biomechanical lesions of sacral region: Secondary | ICD-10-CM | POA: Diagnosis not present

## 2018-06-10 DIAGNOSIS — M9981 Other biomechanical lesions of cervical region: Secondary | ICD-10-CM | POA: Diagnosis not present

## 2018-06-10 NOTE — Patient Instructions (Signed)
Good to see you  Ice is your friend Stay active I am so impressed See me again in 3-4 weeks

## 2018-06-10 NOTE — Assessment & Plan Note (Signed)
Much improved after the PRP.  Discussed icing regimen and home exercise.  Discussed which activities to do which wants to avoid.

## 2018-06-10 NOTE — Assessment & Plan Note (Addendum)
Decision today to treat with OMT was based on Physical Exam  After verbal consent patient was treated with HVLA, ME, FPR techniques in cervical, thoracic, rib,  lumbar and sacral areas  Patient tolerated the procedure well with improvement in symptoms  Patient given exercises, stretches and lifestyle modifications  See medications in patient instructions if given  Patient will follow up in 4-8 weeks 

## 2018-06-14 ENCOUNTER — Telehealth: Payer: Self-pay | Admitting: Emergency Medicine

## 2018-06-14 ENCOUNTER — Telehealth: Payer: Self-pay | Admitting: Physician Assistant

## 2018-06-14 MED ORDER — PREDNISONE 20 MG PO TABS: 40.0000 mg | ORAL_TABLET | Freq: Every day | ORAL | 0 refills | Status: AC

## 2018-06-14 NOTE — Telephone Encounter (Signed)
Patient spoke with provider(Sam) stating that she wasn't feeling any better. Ear pain w/fluid. Per provider instructed me to ask provider here today if she could prescribe patient prednisone. Patient uses [facility]

## 2018-06-14 NOTE — Telephone Encounter (Signed)
Please call pt and let her know I sent prednisone Rx to her pharmacy. Please make sure she takes with food to avoid upset stomach. Other side effects can include:  headache, irritability, nausea, vomiting, increased heart rate, increased blood pressure, increased blood sugar, appetite changes, and insomnia. Please let me know if she has any questions. Thanks!

## 2018-06-14 NOTE — Telephone Encounter (Signed)
Patient was informed that medication was sent to pharmacy

## 2018-06-18 ENCOUNTER — Encounter: Payer: Self-pay | Admitting: Physician Assistant

## 2018-07-08 ENCOUNTER — Encounter: Payer: Self-pay | Admitting: Physician Assistant

## 2018-07-08 DIAGNOSIS — B001 Herpesviral vesicular dermatitis: Secondary | ICD-10-CM

## 2018-07-08 MED ORDER — VALACYCLOVIR HCL 1 G PO TABS: 2000.0000 mg | ORAL_TABLET | Freq: Two times a day (BID) | ORAL | 6 refills | Status: DC

## 2018-07-10 NOTE — Progress Notes (Signed)
[name] [address] [place], [place] [address] Phone: [phone] Subjective:   Kendra [name], am serving as a scribe for Dr. Hulan Saas.  I'm seeing this patient by the request  of:    CC: Back and include pain follow-up  [id number]  Kendra [name] is a 48 y.o. female coming in with complaint of neck, back and glute pain. Has been doing body weight exercises. Did try 40% this week. Is sore but feels that this is not pain. Has not been running. Has tried walking. Is using nitro patches.  Patient also had PRP of the gluteal tendon.  Making progress.  Still soreness but nothing that seems to be as severe.      Past Medical History:  Diagnosis Date  . Allergy   . Breast cancer ([place]) left   2004 with chemo tx  . Osteoporosis    Past Surgical History:  Procedure Laterality Date  . BREAST SURGERY     implant left in 2004, and biopsy  . MASTECTOMY Left 01/07/03   with tram flap and right breast lift in 2008  . TOTAL ABDOMINAL HYSTERECTOMY     related to breast cancer  . TOTAL ABDOMINAL HYSTERECTOMY W/ BILATERAL SALPINGOOPHORECTOMY  10/2003   Social History   Socioeconomic History  . Marital status: Married    Spouse name: Kendra [name]  . Number of children: 2  . Years of education: Not on file  . Highest education level: Not on file  Occupational History  . Not on file  Social Needs  . Financial resource strain: Not on file  . Food insecurity:    Worry: Not on file    Inability: Not on file  . Transportation needs:    Medical: Not on file    Non-medical: Not on file  Tobacco Use  . Smoking status: Never Smoker  . Smokeless tobacco: Never Used  Substance and Sexual Activity  . Alcohol use: [name]  . Drug use: [name]  . Sexual activity: Yes    Birth control/protection: Other-see comments    Comment: had total hysterectomy  Lifestyle  . Physical activity:    Days per week: Not on file    Minutes per session: Not on file  . Stress:  Not on file  Relationships  . Social connections:    Talks on phone: Not on file    Gets together: Not on file    Attends religious service: Not on file    Active member of club or organization: Not on file    Attends meetings of clubs or organizations: Not on file    Relationship status: Not on file  Other Topics Concern  . Not on file  Social History Narrative  . Not on file   [name] Known Allergies Family History  Problem Relation Age of Onset  . Arthritis Mother   . Hypothyroidism Mother   . Breast cancer Mother 48  . Alcohol abuse Brother      Current Outpatient Medications (Cardiovascular):  .  nitroGLYCERIN (NITRODUR - DOSED IN MG/24 HR) 0.2 mg/hr patch, 1/4 patch daily  Current Outpatient Medications (Respiratory):  .  fluticasone (FLONASE) 50 MCG/ACT nasal spray, PLACE 1 SPRAY INTO BOTH NOSTRILS DAILY. Marland Kitchen  Loratadine-Pseudoephedrine (LORATADINE-D 12HR PO), Take by mouth.    Current Outpatient Medications (Other):  Marland Kitchen  Calcium-Vitamin D 600-200 MG-UNIT per tablet, Take 1 tablet by mouth daily.  .  sertraline (ZOLOFT) 25 MG tablet, TAKE 1 TABLET (25  MG TOTAL) BY MOUTH DAILY. .  valACYclovir (VALTREX) 1000 MG tablet, Take 2 tablets (2,000 mg total) by mouth 2 (two) times daily. At onset of cold sore .  VITAMIN D, CHOLECALCIFEROL, PO, Take 1 capsule by mouth daily. .  Vitamin D, Ergocalciferol, (DRISDOL) 50000 units CAPS capsule, Take 1 capsule (50,000 Units total) by mouth every 7 (seven) days.    Past medical history, social, surgical and family history all reviewed in electronic medical record.  [name] pertanent information unless stated regarding to the chief complaint.   Review of Systems:  [name] headache, visual changes, nausea, vomiting, diarrhea, constipation, dizziness, abdominal pain, skin rash, fevers, chills, night sweats, weight loss, swollen lymph nodes, body aches, joint swelling, m chest pain, shortness of breath, mood changes.  Positive muscle aches  Objective    Blood pressure 110/78, pulse 74, height 5' 7.5" (1.715 m), weight 150 lb (68 kg), SpO2 98 %.    General: [name] apparent distress alert and oriented x3 mood and affect normal, dressed appropriately.  HEENT: Pupils equal, extraocular movements intact  Respiratory: Patient's speak in full sentences and does not appear short of breath  Cardiovascular: [name] lower extremity edema, non tender, [name] erythema  Skin: Warm dry intact with [name] signs of infection or rash on extremities or on axial skeleton.  Abdomen: Soft nontender  Neuro: Cranial nerves II through XII are intact, neurovascularly intact in all extremities with 2+ DTRs and 2+ pulses.  Lymph: [name] lymphadenopathy of posterior or anterior cervical chain or axillae bilaterally.  Gait antalgic MSK:  tender with mild limited range of motion and good stability and symmetric strength and tone of shoulders, elbows, wrist, hip, knee and ankles bilaterally.  Patient back exam does show some loss of lordosis.  Patient does have a mild positive Corky Sox still noted on the side of the previous tear right side.  Negative straight leg test.  Tightness in the paraspinal musculature.  Nearly full range of motion.  Osteopathic findings C6 flexed rotated and side bent left T3 extended rotated and side bent right inhaled third rib T7 extended rotated and side bent left L2 flexed rotated and side bent right Sacrum right on right     Impression and Recommendations:     This case required medical decision making of moderate complexity. The above documentation has been reviewed and is accurate and complete Kendra Pulley, DO       Note: This dictation was prepared with Dragon dictation along with smaller phrase technology. Any transcriptional errors that result from this process are unintentional.

## 2018-07-11 ENCOUNTER — Ambulatory Visit (INDEPENDENT_AMBULATORY_CARE_PROVIDER_SITE_OTHER): Payer: 59 | Admitting: Family Medicine

## 2018-07-11 VITALS — BP 110/78 | HR 74 | Ht 67.5 in | Wt 150.0 lb

## 2018-07-11 DIAGNOSIS — M999 Biomechanical lesion, unspecified: Secondary | ICD-10-CM | POA: Diagnosis not present

## 2018-07-11 DIAGNOSIS — M7601 Gluteal tendinitis, right hip: Secondary | ICD-10-CM | POA: Diagnosis not present

## 2018-07-11 NOTE — Assessment & Plan Note (Signed)
Patient is making significant progress at this time.  Patient is going to start increasing activity.  8 weeks out from the PRP injections.  Patient will continue with home exercises as well.  We discussed progression.  Patient will continue to work with her Physiological scientist.  Follow-up again in 6 to 8 weeks

## 2018-07-11 NOTE — Patient Instructions (Signed)
ice is your friend OK to do a couch to 5K  I like bike or ellitpical for another 3 weeks OK to either increase weight 10% a week OR increase frequency but then drop weight 10%  See me again in 4-6 weeks

## 2018-07-11 NOTE — Assessment & Plan Note (Signed)
Decision today to treat with OMT was based on Physical Exam  After verbal consent patient was treated with HVLA, ME, FPR techniques in cervical, thoracic, rib lumbar and sacral areas  Patient tolerated the procedure well with improvement in symptoms  Patient given exercises, stretches and lifestyle modifications  See medications in patient instructions if given  Patient will follow up in 6-8 weeks 

## 2018-07-15 ENCOUNTER — Other Ambulatory Visit: Payer: Self-pay | Admitting: Physician Assistant

## 2018-07-15 DIAGNOSIS — Z1231 Encounter for screening mammogram for malignant neoplasm of breast: Secondary | ICD-10-CM

## 2018-07-30 ENCOUNTER — Encounter: Payer: Self-pay | Admitting: Family Medicine

## 2018-07-30 MED ORDER — CYCLOBENZAPRINE HCL 10 MG PO TABS: 10.0000 mg | ORAL_TABLET | Freq: Three times a day (TID) | ORAL | 1 refills | Status: DC | PRN

## 2018-08-09 ENCOUNTER — Ambulatory Visit
Admission: RE | Admit: 2018-08-09 | Discharge: 2018-08-09 | Disposition: A | Discharge status: Home / Self Care | Payer: 59 | Source: Ambulatory Visit | Attending: Physician Assistant | Admitting: Physician Assistant

## 2018-08-09 ENCOUNTER — Telehealth: Payer: Self-pay

## 2018-08-09 DIAGNOSIS — Z1231 Encounter for screening mammogram for malignant neoplasm of breast: Secondary | ICD-10-CM | POA: Diagnosis not present

## 2018-08-09 HISTORY — DX: Personal history of antineoplastic chemotherapy: Z92.21

## 2018-08-09 NOTE — Telephone Encounter (Signed)
Patient advised as directed below. 

## 2018-08-09 NOTE — Telephone Encounter (Signed)
-----   Message from Mar Daring, [place] sent at 08/09/2018  2:40 PM EST ----- Normal mammogram. Repeat screening in one year.

## 2018-08-14 ENCOUNTER — Ambulatory Visit (INDEPENDENT_AMBULATORY_CARE_PROVIDER_SITE_OTHER): Payer: 59 | Admitting: Family Medicine

## 2018-08-14 ENCOUNTER — Encounter: Payer: Self-pay | Admitting: Family Medicine

## 2018-08-14 VITALS — BP 126/84 | HR 78 | Ht 67.5 in | Wt 150.0 lb

## 2018-08-14 DIAGNOSIS — M9902 Segmental and somatic dysfunction of thoracic region: Secondary | ICD-10-CM

## 2018-08-14 DIAGNOSIS — M9908 Segmental and somatic dysfunction of rib cage: Secondary | ICD-10-CM | POA: Diagnosis not present

## 2018-08-14 DIAGNOSIS — M533 Sacrococcygeal disorders, not elsewhere classified: Secondary | ICD-10-CM

## 2018-08-14 DIAGNOSIS — M9901 Segmental and somatic dysfunction of cervical region: Secondary | ICD-10-CM

## 2018-08-14 DIAGNOSIS — M9903 Segmental and somatic dysfunction of lumbar region: Secondary | ICD-10-CM | POA: Diagnosis not present

## 2018-08-14 DIAGNOSIS — M999 Biomechanical lesion, unspecified: Secondary | ICD-10-CM

## 2018-08-14 DIAGNOSIS — M9904 Segmental and somatic dysfunction of sacral region: Secondary | ICD-10-CM

## 2018-08-14 NOTE — Progress Notes (Signed)
[name] [address], [place] [address] Phone: [phone] Subjective:    I Kendra [name] am serving as a Education administrator for Dr. Hulan Saas.   I'm seeing this patient by the request  of:    CC: Back pain follow-up  YIR:[id number]  Kendra [name] is a 48 y.o. female coming in with complaint of back pain. States that she is doing better. Right hip is doing well. Threw her back out a few days ago and missed 3 days of work. Wants to know if it is ok to do cardio every day. Patient states that overall though seems to be doing relatively well.  Patient did have more of the right gluteal tendon and did respond fairly well to PRP.  Still some mild discomfort in the area.  Patient is increasing activity at the time.  Recently mild discomfort and pain but nothing as severe as of 3 days recently.  Has been increasing activity and getting ready for a race in 1 month     Past Medical History:  Diagnosis Date  . Allergy   . Breast cancer ([place]) left   2004 with chemo tx  . Osteoporosis   . Personal history of chemotherapy    Past Surgical History:  Procedure Laterality Date  . BREAST SURGERY     implant left in 2004, and biopsy  . MASTECTOMY Left 01/07/03   with tram flap and right breast lift in 2008  . TOTAL ABDOMINAL HYSTERECTOMY     related to breast cancer  . TOTAL ABDOMINAL HYSTERECTOMY W/ BILATERAL SALPINGOOPHORECTOMY  10/2003   Social History   Socioeconomic History  . Marital status: Married    Spouse name: Kendra [name]  . Number of children: 2  . Years of education: Not on file  . Highest education level: Not on file  Occupational History  . Not on file  Social Needs  . Financial resource strain: Not on file  . Food insecurity:    Worry: Not on file    Inability: Not on file  . Transportation needs:    Medical: Not on file    Non-medical: Not on file  Tobacco Use  . Smoking status: Never Smoker  . Smokeless tobacco: Never Used    Substance and Sexual Activity  . Alcohol use: No  . Drug use: No  . Sexual activity: Yes    Birth control/protection: Other-see comments    Comment: had total hysterectomy  Lifestyle  . Physical activity:    Days per week: Not on file    Minutes per session: Not on file  . Stress: Not on file  Relationships  . Social connections:    Talks on phone: Not on file    Gets together: Not on file    Attends religious service: Not on file    Active member of club or organization: Not on file    Attends meetings of clubs or organizations: Not on file    Relationship status: Not on file  Other Topics Concern  . Not on file  Social History Narrative  . Not on file   No Known Allergies Family History  Problem Relation Age of Onset  . Arthritis Mother   . Hypothyroidism Mother   . Breast cancer Mother 72  . Alcohol abuse Brother      Current Outpatient Medications (Cardiovascular):  .  nitroGLYCERIN (NITRODUR - DOSED IN MG/24 HR) 0.2 mg/hr patch, 1/4 patch daily  Current Outpatient Medications (  Respiratory):  .  fluticasone (FLONASE) 50 MCG/ACT nasal spray, PLACE 1 SPRAY INTO BOTH NOSTRILS DAILY. Marland Kitchen  Loratadine-Pseudoephedrine (LORATADINE-D 12HR PO), Take by mouth.    Current Outpatient Medications (Other):  Marland Kitchen  Calcium-Vitamin D 600-200 MG-UNIT per tablet, Take 1 tablet by mouth daily.  .  cyclobenzaprine (FLEXERIL) 10 MG tablet, Take 1 tablet (10 mg total) by mouth 3 (three) times daily as needed for muscle spasms. Marland Kitchen  sertraline (ZOLOFT) 25 MG tablet, TAKE 1 TABLET (25 MG TOTAL) BY MOUTH DAILY. .  valACYclovir (VALTREX) 1000 MG tablet, Take 2 tablets (2,000 mg total) by mouth 2 (two) times daily. At onset of cold sore .  VITAMIN D, CHOLECALCIFEROL, PO, Take 1 capsule by mouth daily. .  Vitamin D, Ergocalciferol, (DRISDOL) 50000 units CAPS capsule, Take 1 capsule (50,000 Units total) by mouth every 7 (seven) days.    Past medical history, social, surgical and family history  all reviewed in electronic medical record.  No pertanent information unless stated regarding to the chief complaint.   Review of Systems:  No headache, visual changes, nausea, vomiting, diarrhea, constipation, dizziness, abdominal pain, skin rash, fevers, chills, night sweats, weight loss, swollen lymph nodes, body aches, joint swelling, muscle aches, chest pain, shortness of breath, mood changes.   Objective  There were no vitals taken for this visit. Systems examined below as of    General: No apparent distress alert and oriented x3 mood and affect normal, dressed appropriately.  HEENT: Pupils equal, extraocular movements intact  Respiratory: Patient's speak in full sentences and does not appear short of breath  Cardiovascular: No lower extremity edema, non tender, no erythema  Skin: Warm dry intact with no signs of infection or rash on extremities or on axial skeleton.  Abdomen: Soft nontender  Neuro: Cranial nerves II through XII are intact, neurovascularly intact in all extremities with 2+ DTRs and 2+ pulses.  Lymph: No lymphadenopathy of posterior or anterior cervical chain or axillae bilaterally.  Gait normal with good balance and coordination.  MSK:  Non tender with full range of motion and good stability and symmetric strength and tone of shoulders, elbows, wrist, hip, knee and ankles bilaterally.  Back Exam:  Inspection: Unremarkable  Motion: Flexion 45 deg, Extension 25 deg, Side Bending to 45 deg bilaterally,  Rotation to 45 deg bilaterally  SLR laying: Negative  XSLR laying: Negative  Palpable tenderness: Tenderness to palpation in the paraspinal musculature lumbar spine right greater than left and more of the right gluteal tendon area. FABER: Tightness on the right side.. Sensory change: Gross sensation intact to all lumbar and sacral dermatomes.  Reflexes: 2+ at both patellar tendons, 2+ at achilles tendons, Babinski's downgoing.  Strength at foot  Plantar-flexion: 5/5  Dorsi-flexion: 5/5 Eversion: 5/5 Inversion: 5/5  Leg strength  Quad: 5/5 Hamstring: 5/5 Hip flexor: 5/5 Hip abductors: 5/5    Osteopathic findings C4 flexed rotated and side bent left C6 flexed rotated and side bent left T3 extended rotated and side bent right inhaled third rib T5 extended rotated and side bent left L2 flexed rotated and side bent right Sacrum right on right    Impression and Recommendations:     This case required medical decision making of moderate complexity. The above documentation has been reviewed and is accurate and complete [name]       Note: This dictation was prepared with Dragon dictation along with smaller phrase technology. Any transcriptional errors that result from this process are unintentional.

## 2018-08-14 NOTE — Patient Instructions (Signed)
Good to see you  Ice is your friend Stay active Exercises on wall.  Heel and butt touching.  Raise leg 6 inches and hold 2 seconds.  Down slow for count of 4 seconds.  1 set of 30 reps daily on both sides.  See me again in 4 weeks right after the race!

## 2018-08-14 NOTE — Assessment & Plan Note (Signed)
Decision today to treat with OMT was based on Physical Exam  After verbal consent patient was treated with HVLA, ME, FPR techniques in cervical, thoracic, rib lumbar and sacral areas  Patient tolerated the procedure well with improvement in symptoms  Patient given exercises, stretches and lifestyle modifications  See medications in patient instructions if given  Patient will follow up in 4 weeks 

## 2018-08-14 NOTE — Assessment & Plan Note (Signed)
More of a sacroiliac dysfunction.  I believe the patient did have more of a spasm of the quadratus lumborum as well as the hip flexor.  We discussed with patient at length.  Discussed the importance of hip abductor strengthening.  Encourage patient to continue all medications including the vitamin D.  Patient will continue to work out in the home okay with patient increasing the cardiovascular training at the moment.  Patient hashad a month and we will see her again in 1 month.

## 2018-08-30 ENCOUNTER — Encounter: Payer: Self-pay | Admitting: Physician Assistant

## 2018-08-30 DIAGNOSIS — J302 Other seasonal allergic rhinitis: Secondary | ICD-10-CM

## 2018-08-30 MED ORDER — LORATADINE-PSEUDOEPHEDRINE ER 10-240 MG PO TB24: 1.0000 | ORAL_TABLET | Freq: Every day | ORAL | 1 refills | Status: DC

## 2018-09-11 ENCOUNTER — Encounter: Payer: Self-pay | Admitting: *Deleted

## 2018-09-24 ENCOUNTER — Ambulatory Visit: Payer: 59 | Admitting: Family Medicine

## 2018-10-21 ENCOUNTER — Encounter: Payer: Self-pay | Admitting: Family Medicine

## 2018-10-25 ENCOUNTER — Other Ambulatory Visit: Payer: Self-pay

## 2018-10-25 ENCOUNTER — Encounter: Payer: Self-pay | Admitting: Family Medicine

## 2018-10-25 ENCOUNTER — Ambulatory Visit (INDEPENDENT_AMBULATORY_CARE_PROVIDER_SITE_OTHER): Payer: 59 | Admitting: Family Medicine

## 2018-10-25 VITALS — BP 130/80 | HR 58 | Ht 67.5 in | Wt 143.0 lb

## 2018-10-25 DIAGNOSIS — M999 Biomechanical lesion, unspecified: Secondary | ICD-10-CM

## 2018-10-25 DIAGNOSIS — M533 Sacrococcygeal disorders, not elsewhere classified: Secondary | ICD-10-CM

## 2018-10-25 NOTE — Progress Notes (Signed)
[name] [address], [place] [address] Phone: [phone] Subjective:   I [name] am serving as a Education administrator for Dr. Hulan Saas.  I'm seeing this patient by the request  of:    CC: Back pain follow-up  IDP:[id number]  Kendra [name] is a 48 y.o. female coming in with complaint of back pain. States that the back is doing pretty good. Still experiences some stiffness. States that she is having right hip pain.  Right-sided and likely secondary to more of the back she thinks.  Has been some time.  Has been running intermittently.  Has not been working out quite as frequently though because patient is not been able to go to the gym.  Been a little less religious of doing the home exercises at home.-     Past Medical History:  Diagnosis Date   Allergy    Breast cancer ([facility]) left   2004 with chemo tx   Osteoporosis    Personal history of chemotherapy    Past Surgical History:  Procedure Laterality Date   BREAST SURGERY     implant left in 2004, and biopsy   MASTECTOMY Left 01/07/03   with tram flap and right breast lift in 2008   TOTAL ABDOMINAL HYSTERECTOMY     related to breast cancer   TOTAL ABDOMINAL HYSTERECTOMY W/ BILATERAL SALPINGOOPHORECTOMY  10/2003   Social History   Socioeconomic History   Marital status: Married    Spouse name: Christne Platts   Number of children: 2   Years of education: Not on file   Highest education level: Not on file  Occupational History   Not on file  Social Needs   Financial resource strain: Not on file   Food insecurity:    Worry: Not on file    Inability: Not on file   Transportation needs:    Medical: Not on file    Non-medical: Not on file  Tobacco Use   Smoking status: Never Smoker   Smokeless tobacco: Never Used  Substance and Sexual Activity   Alcohol use: No   Drug use: No   Sexual activity: Yes    Birth control/protection: Other-see comments    Comment: had  total hysterectomy  Lifestyle   Physical activity:    Days per week: Not on file    Minutes per session: Not on file   Stress: Not on file  Relationships   Social connections:    Talks on phone: Not on file    Gets together: Not on file    Attends religious service: Not on file    Active member of club or organization: Not on file    Attends meetings of clubs or organizations: Not on file    Relationship status: Not on file  Other Topics Concern   Not on file  Social History Narrative   Not on file   No Known Allergies Family History  Problem Relation Age of Onset   Arthritis Mother    Hypothyroidism Mother    Breast cancer Mother 3   Alcohol abuse Brother      Current Outpatient Medications (Cardiovascular):    nitroGLYCERIN (NITRODUR - DOSED IN MG/24 HR) 0.2 mg/hr patch, 1/4 patch daily  Current Outpatient Medications (Respiratory):    fluticasone (FLONASE) 50 MCG/ACT nasal spray, PLACE 1 SPRAY INTO BOTH NOSTRILS DAILY.   loratadine-pseudoephedrine (CLARITIN-D 24 HOUR) 10-240 MG 24 hr tablet, Take 1 tablet by mouth daily.  Current Outpatient Medications (Other):    Calcium-Vitamin D 600-200 MG-UNIT per tablet, Take 1 tablet by mouth daily.    cyclobenzaprine (FLEXERIL) 10 MG tablet, Take 1 tablet (10 mg total) by mouth 3 (three) times daily as needed for muscle spasms.   sertraline (ZOLOFT) 25 MG tablet, TAKE 1 TABLET (25 MG TOTAL) BY MOUTH DAILY.   valACYclovir (VALTREX) 1000 MG tablet, Take 2 tablets (2,000 mg total) by mouth 2 (two) times daily. At onset of cold sore   VITAMIN D, CHOLECALCIFEROL, PO, Take 1 capsule by mouth daily.   Vitamin D, Ergocalciferol, (DRISDOL) 50000 units CAPS capsule, Take 1 capsule (50,000 Units total) by mouth every 7 (seven) days.    Past medical history, social, surgical and family history all reviewed in electronic medical record.  No pertanent information unless stated regarding to the chief complaint.    Review of Systems:  No headache, visual changes, nausea, vomiting, diarrhea, constipation, dizziness, abdominal pain, skin rash, fevers, chills, night sweats, weight loss, swollen lymph nodes, body aches, joint swelling, , chest pain, shortness of breath, mood changes.  Positive muscle aches  Objective  Blood pressure 130/80, pulse (!) 58, height 5' 7.5" (1.715 m), weight 143 lb (64.9 kg), SpO2 98 %.  General: No apparent distress alert and oriented x3 mood and affect normal, dressed appropriately.  HEENT: Pupils equal, extraocular movements intact  Respiratory: Patient's speak in full sentences and does not appear short of breath  Cardiovascular: No lower extremity edema, non tender, no erythema  Skin: Warm dry intact with no signs of infection or rash on extremities or on axial skeleton.  Abdomen: Soft nontender  Neuro: Cranial nerves II through XII are intact, neurovascularly intact in all extremities with 2+ DTRs and 2+ pulses.  Lymph: No lymphadenopathy of posterior or anterior cervical chain or axillae bilaterally.  Gait normal with good balance and coordination.  MSK:  Non tender with full range of motion and good stability and symmetric strength and tone of shoulders, elbows, wrist, hip, knee and ankles bilaterally.    Osteopathic findings C2 flexed rotated and side bent right T3 extended rotated and side bent right inhaled third rib T9 extended rotated and side bent left L2 flexed rotated and side bent right Sacrum right on right    Impression and Recommendations:     This case required medical decision making of moderate complexity. The above documentation has been reviewed and is accurate and complete Lyndal Pulley, DO       Note: This dictation was prepared with Dragon dictation along with smaller phrase technology. Any transcriptional errors that result from this process are unintentional.

## 2018-10-25 NOTE — Assessment & Plan Note (Signed)
Decision today to treat with OMT was based on Physical Exam  After verbal consent patient was treated with HVLA, ME, FPR techniques in cervical, thoracic, rib lumbar and sacral areas  Patient tolerated the procedure well with improvement in symptoms  Patient given exercises, stretches and lifestyle modifications  See medications in patient instructions if given  Patient will follow up in 4-8 weeks 

## 2018-10-25 NOTE — Assessment & Plan Note (Signed)
Continues to have some difficulty with the sacroiliac joint.  Some more tightness recently over the hip flexors as well as the iliac as it appears.  We discussed different rotational exercises today that are different as well as encouraged to continue doing strength doctors.  Patient will try to make these changes and follow-up with me again in 4 to 8 weeks.

## 2018-10-28 ENCOUNTER — Encounter: Payer: Self-pay | Admitting: Physician Assistant

## 2018-10-29 ENCOUNTER — Ambulatory Visit (INDEPENDENT_AMBULATORY_CARE_PROVIDER_SITE_OTHER): Payer: 59 | Admitting: Physician Assistant

## 2018-10-29 ENCOUNTER — Encounter: Payer: Self-pay | Admitting: Physician Assistant

## 2018-10-29 ENCOUNTER — Other Ambulatory Visit: Payer: Self-pay

## 2018-10-29 VITALS — BP 119/74 | HR 62 | Temp 98.1°F | Resp 16 | Wt 155.2 lb

## 2018-10-29 DIAGNOSIS — K645 Perianal venous thrombosis: Secondary | ICD-10-CM

## 2018-10-29 MED ORDER — HYDROCORTISONE (PERIANAL) 2.5 % EX CREA: 1.0000 "application " | TOPICAL_CREAM | Freq: Two times a day (BID) | CUTANEOUS | 0 refills | Status: DC

## 2018-10-29 NOTE — Patient Instructions (Signed)
Hemorrhoids Hemorrhoids are swollen veins that may develop:  In the butt (rectum). These are called internal hemorrhoids.  Around the opening of the butt (anus). These are called external hemorrhoids. Hemorrhoids can cause pain, itching, or bleeding. Most of the time, they do not cause serious problems. They usually get better with diet changes, lifestyle changes, and other home treatments. What are the causes? This condition may be caused by:  Having trouble pooping (constipation).  Pushing hard (straining) to poop.  Watery poop (diarrhea).  Pregnancy.  Being very overweight (obese).  Sitting for long periods of time.  Heavy lifting or other activity that causes you to strain.  Anal sex.  Riding a bike for a long period of time. What are the signs or symptoms? Symptoms of this condition include:  Pain.  Itching or soreness in the butt.  Bleeding from the butt.  Leaking poop.  Swelling in the area.  One or more lumps around the opening of your butt. How is this diagnosed? A doctor can often diagnose this condition by looking at the affected area. The doctor may also:  Do an exam that involves feeling the area with a gloved hand (digital rectal exam).  Examine the area inside your butt using a small tube (anoscope).  Order blood tests. This may be done if you have lost a lot of blood.  Have you get a test that involves looking inside the colon using a flexible tube with a camera on the end (sigmoidoscopy or colonoscopy). How is this treated? This condition can usually be treated at home. Your doctor may tell you to change what you eat, make lifestyle changes, or try home treatments. If these do not help, procedures can be done to remove the hemorrhoids or make them smaller. These may involve:  Placing rubber bands at the base of the hemorrhoids to cut off their blood supply.  Injecting medicine into the hemorrhoids to shrink them.  Shining a type of light  energy onto the hemorrhoids to cause them to fall off.  Doing surgery to remove the hemorrhoids or cut off their blood supply. Follow these instructions at home: Eating and drinking   Eat foods that have a lot of fiber in them. These include whole grains, beans, nuts, fruits, and vegetables.  Ask your doctor about taking products that have added fiber (fibersupplements).  Reduce the amount of fat in your diet. You can do this by: ? Eating low-fat dairy products. ? Eating less red meat. ? Avoiding processed foods.  Drink enough fluid to keep your pee (urine) pale yellow. Managing pain and swelling   Take a warm-water bath (sitz bath) for 20 minutes to ease pain. Do this 3-4 times a day. You may do this in a bathtub or using a portable sitz bath that fits over the toilet.  If told, put ice on the painful area. It may be helpful to use ice between your warm baths. ? Put ice in a plastic bag. ? Place a towel between your skin and the bag. ? Leave the ice on for 20 minutes, 2-3 times a day. General instructions  Take over-the-counter and prescription medicines only as told by your doctor. ? Medicated creams and medicines may be used as told.  Exercise often. Ask your doctor how much and what kind of exercise is best for you.  Go to the bathroom when you have the urge to poop. Do not wait.  Avoid pushing too hard when you poop.  Keep your   butt dry and clean. Use wet toilet paper or moist towelettes after pooping.  Do not sit on the toilet for a long time.  Keep all follow-up visits as told by your doctor. This is important. Contact a doctor if you:  Have pain and swelling that do not get better with treatment or medicine.  Have trouble pooping.  Cannot poop.  Have pain or swelling outside the area of the hemorrhoids. Get help right away if you have:  Bleeding that will not stop. Summary  Hemorrhoids are swollen veins in the butt or around the opening of the butt.   They can cause pain, itching, or bleeding.  Eat foods that have a lot of fiber in them. These include whole grains, beans, nuts, fruits, and vegetables.  Take a warm-water bath (sitz bath) for 20 minutes to ease pain. Do this 3-4 times a day. This information is not intended to replace advice given to you by your health care provider. Make sure you discuss any questions you have with your health care provider. Document Released: 03/07/2008 Document Revised: 10/18/2017 Document Reviewed: 10/18/2017 Elsevier Interactive Patient Education  2019 [employer]  

## 2018-10-29 NOTE — Progress Notes (Signed)
       [name] Female    DOB: 02-Oct-1970   48 y.o.   MRN: [id number] Visit Date: 10/29/2018  Today's Provider: Trinna Post, PA-C   Chief Complaint  Patient presents with  . Cyst   Subjective:     HPI   Patient here with c/o possible anal cyst.Patient reports it hurts to seat down is feels tender. She notice it on Sunday. Reports no bleeding, blood in her stool. She has intermittent diarrhea and constipation.   No Known Allergies   Current Outpatient Medications:  .  Calcium-Vitamin D 600-200 MG-UNIT per tablet, Take 1 tablet by mouth daily. , Disp: , Rfl:  .  cyclobenzaprine (FLEXERIL) 10 MG tablet, Take 1 tablet (10 mg total) by mouth 3 (three) times daily as needed for muscle spasms., Disp: 90 tablet, Rfl: 1 .  fluticasone (FLONASE) 50 MCG/ACT nasal spray, PLACE 1 SPRAY INTO BOTH NOSTRILS DAILY., Disp: 16 g, Rfl: 11 .  loratadine-pseudoephedrine (CLARITIN-D 24 HOUR) 10-240 MG 24 hr tablet, Take 1 tablet by mouth daily., Disp: 90 tablet, Rfl: 1 .  sertraline (ZOLOFT) 25 MG tablet, TAKE 1 TABLET (25 MG TOTAL) BY MOUTH DAILY., Disp: 90 tablet, Rfl: 1 .  valACYclovir (VALTREX) 1000 MG tablet, Take 2 tablets (2,000 mg total) by mouth 2 (two) times daily. At onset of cold sore, Disp: 20 tablet, Rfl: 6 .  VITAMIN D, CHOLECALCIFEROL, PO, Take 1 capsule by mouth daily., Disp: , Rfl:  .  Vitamin D, Ergocalciferol, (DRISDOL) 50000 units CAPS capsule, Take 1 capsule (50,000 Units total) by mouth every 7 (seven) days., Disp: 12 capsule, Rfl: 0 .  nitroGLYCERIN (NITRODUR - DOSED IN MG/24 HR) 0.2 mg/hr patch, 1/4 patch daily, Disp: 30 patch, Rfl: 1  Review of Systems  Social History   Tobacco Use  . Smoking status: Never Smoker  . Smokeless tobacco: Never Used  Substance Use Topics  . Alcohol use: No      Objective:   BP 119/74 (BP Location: Left Arm, Patient Position: Sitting, Cuff Size: Large)   Pulse 62   Temp 98.1 F (36.7 C) (Oral)   Resp 16   Wt 155 lb  3.2 oz (70.4 kg)   BMI 23.95 kg/m  Vitals:   10/29/18 1051  BP: 119/74  Pulse: 62  Resp: 16  Temp: 98.1 F (36.7 C)  TempSrc: Oral  Weight: 155 lb 3.2 oz (70.4 kg)     Physical Exam Constitutional:      Appearance: Normal appearance.  Genitourinary:    Rectum: External hemorrhoid present.     Comments: Thrombosed external hemorrhoid present.  Neurological:     Mental Status: She is alert.         Assessment & Plan    1. Thrombosed external hemorrhoid  Counseled this will go away in 1-2 weeks. Counseled on avoiding constipation with Miralax, steroid cream as needed and SITZ baths.   - hydrocortisone (ANUSOL-HC) 2.5 % rectal cream; Place 1 application rectally 2 (two) times daily.  Dispense: 30 g; Refill: 0  The entirety of the information documented in the History of Present Illness, Review of Systems and Physical Exam were personally obtained by me. Portions of this information were initially documented by Lyndel Pleasure, CMA and reviewed by me for thoroughness and accuracy.    F/u PRN.      Trinna Post, PA-C  [facility]

## 2018-11-29 ENCOUNTER — Encounter: Payer: Self-pay | Admitting: Family Medicine

## 2018-11-29 ENCOUNTER — Other Ambulatory Visit: Payer: Self-pay

## 2018-11-29 ENCOUNTER — Ambulatory Visit (INDEPENDENT_AMBULATORY_CARE_PROVIDER_SITE_OTHER): Payer: 59 | Admitting: Family Medicine

## 2018-11-29 VITALS — BP 102/60 | HR 64 | Ht 67.5 in | Wt 150.0 lb

## 2018-11-29 DIAGNOSIS — M999 Biomechanical lesion, unspecified: Secondary | ICD-10-CM | POA: Diagnosis not present

## 2018-11-29 DIAGNOSIS — M5416 Radiculopathy, lumbar region: Secondary | ICD-10-CM

## 2018-11-29 NOTE — Assessment & Plan Note (Signed)
Patient does have more back pain.  Does have gluteal tendinitis as well.  Discussed icing regimen and home exercise.  Discussed which activities to do which wants to avoid.  Discussed osteopathic manipulation and patient responded well to it again.  Follow-up again in 6 to 8 weeks

## 2018-11-29 NOTE — Assessment & Plan Note (Signed)
Decision today to treat with OMT was based on Physical Exam  After verbal consent patient was treated with HVLA, ME, FPR techniques in cervical, thoracic, rib lumbar and sacral areas  Patient tolerated the procedure well with improvement in symptoms  Patient given exercises, stretches and lifestyle modifications  See medications in patient instructions if given  Patient will follow up in 6-8 weeks 

## 2018-11-29 NOTE — Progress Notes (Signed)
[name] [address], [place] [address] Phone: [phone] Subjective:   I Kendra [name] am serving as a Education administrator for Dr. Hulan Saas.   CC: Hip pain follow-up  QIH:[id number]  Kendra [name] is a 48 y.o. female coming in with complaint of hip pain. States that she is out of alignment. Would like to know how to keep them aligned. Hip is doing better after injection. Still has glut issues.  Patient continues to have some mild discomfort.  Patient though has been intermittently changing her workout programs and seems to be doing better.  Not running as much.      Past Medical History:  Diagnosis Date  . Allergy   . Breast cancer ([name]) left   2004 with chemo tx  . Osteoporosis   . Personal history of chemotherapy    Past Surgical History:  Procedure Laterality Date  . BREAST SURGERY     implant left in 2004, and biopsy  . MASTECTOMY Left 01/07/03   with tram flap and right breast lift in 2008  . TOTAL ABDOMINAL HYSTERECTOMY     related to breast cancer  . TOTAL ABDOMINAL HYSTERECTOMY W/ BILATERAL SALPINGOOPHORECTOMY  10/2003   Social History   Socioeconomic History  . Marital status: Married    Spouse name: Kendra [name]  . Number of children: 2  . Years of education: Not on file  . Highest education level: Not on file  Occupational History  . Not on file  Social Needs  . Financial resource strain: Not on file  . Food insecurity    Worry: Not on file    Inability: Not on file  . Transportation needs    Medical: Not on file    Non-medical: Not on file  Tobacco Use  . Smoking status: Never Smoker  . Smokeless tobacco: Never Used  Substance and Sexual Activity  . Alcohol use: No  . Drug use: No  . Sexual activity: Yes    Birth control/protection: Other-see comments    Comment: had total hysterectomy  Lifestyle  . Physical activity    Days per week: Not on file    Minutes per session: Not on file  . Stress: Not on file   Relationships  . Social Herbalist on phone: Not on file    Gets together: Not on file    Attends religious service: Not on file    Active member of club or organization: Not on file    Attends meetings of clubs or organizations: Not on file    Relationship status: Not on file  Other Topics Concern  . Not on file  Social History Narrative  . Not on file   No Known Allergies Family History  Problem Relation Age of Onset  . Arthritis Mother   . Hypothyroidism Mother   . Breast cancer Mother 30  . Alcohol abuse Brother      Current Outpatient Medications (Cardiovascular):  .  nitroGLYCERIN (NITRODUR - DOSED IN MG/24 HR) 0.2 mg/hr patch, 1/4 patch daily  Current Outpatient Medications (Respiratory):  .  fluticasone (FLONASE) 50 MCG/ACT nasal spray, PLACE 1 SPRAY INTO BOTH NOSTRILS DAILY. Marland Kitchen  loratadine-pseudoephedrine (CLARITIN-D 24 HOUR) 10-240 MG 24 hr tablet, Take 1 tablet by mouth daily.    Current Outpatient Medications (Other):  Marland Kitchen  Calcium-Vitamin D 600-200 MG-UNIT per tablet, Take 1 tablet by mouth daily.  .  cyclobenzaprine (FLEXERIL) 10 MG tablet, Take 1 tablet (10  mg total) by mouth 3 (three) times daily as needed for muscle spasms. .  hydrocortisone (ANUSOL-HC) 2.5 % rectal cream, Place 1 application rectally 2 (two) times daily. .  sertraline (ZOLOFT) 25 MG tablet, TAKE 1 TABLET (25 MG TOTAL) BY MOUTH DAILY. .  valACYclovir (VALTREX) 1000 MG tablet, Take 2 tablets (2,000 mg total) by mouth 2 (two) times daily. At onset of cold sore .  VITAMIN D, CHOLECALCIFEROL, PO, Take 1 capsule by mouth daily. .  Vitamin D, Ergocalciferol, (DRISDOL) 50000 units CAPS capsule, Take 1 capsule (50,000 Units total) by mouth every 7 (seven) days.    Past medical history, social, surgical and family history all reviewed in electronic medical record.  No pertanent information unless stated regarding to the chief complaint.   Review of Systems:  No headache, visual changes,  nausea, vomiting, diarrhea, constipation, dizziness, abdominal pain, skin rash, fevers, chills, night sweats, weight loss, swollen lymph nodes, body aches, joint swelling,  chest pain, shortness of breath, mood changes.  Mild positive muscle aches  Objective  Blood pressure 102/60, pulse 64, height 5' 7.5" (1.715 m), weight 150 lb (68 kg), SpO2 97 %.    General: No apparent distress alert and oriented x3 mood and affect normal, dressed appropriately.  HEENT: Pupils equal, extraocular movements intact  Respiratory: Patient's speak in full sentences and does not appear short of breath  Cardiovascular: No lower extremity edema, non tender, no erythema  Skin: Warm dry intact with no signs of infection or rash on extremities or on axial skeleton.  Abdomen: Soft nontender  Neuro: Cranial nerves II through XII are intact, neurovascularly intact in all extremities with 2+ DTRs and 2+ pulses.  Lymph: No lymphadenopathy of posterior or anterior cervical chain or axillae bilaterally.  Gait normal with good balance and coordination.  MSK:  Non tender with full range of motion and good stability and symmetric strength and tone of shoulders, elbows, wrist, hip, knee and ankles bilaterally.     Back - Normal skin, Spine with normal alignment and no deformity.  No tenderness to vertebral process palpation.  Paraspinous muscles are not tender and without spasm.   Range of motion is full at neck and lumbar sacral regions   Osteopathic findings C2 flexed rotated and side bent right C6 flexed rotated and side bent left T3 extended rotated and side bent right inhaled third rib T6 extended rotated and side bent left L2 flexed rotated and side bent right Sacrum right on right    Impression and Recommendations:     This case required medical decision making of moderate complexity. The above documentation has been reviewed and is accurate and complete Lyndal Pulley, DO       Note: This dictation was  prepared with Dragon dictation along with smaller phrase technology. Any transcriptional errors that result from this process are unintentional.

## 2018-11-29 NOTE — Patient Instructions (Signed)
Good morning! Doing great! See you in 6-8 weeks

## 2018-12-20 ENCOUNTER — Other Ambulatory Visit: Payer: Self-pay

## 2018-12-20 ENCOUNTER — Encounter: Payer: Self-pay | Admitting: Physician Assistant

## 2018-12-20 ENCOUNTER — Ambulatory Visit (INDEPENDENT_AMBULATORY_CARE_PROVIDER_SITE_OTHER): Payer: 59 | Admitting: Physician Assistant

## 2018-12-20 VITALS — BP 96/62 | HR 84 | Temp 98.2°F | Resp 16 | Ht 68.0 in | Wt 152.0 lb

## 2018-12-20 DIAGNOSIS — Z0000 Encounter for general adult medical examination without abnormal findings: Secondary | ICD-10-CM

## 2018-12-20 DIAGNOSIS — E559 Vitamin D deficiency, unspecified: Secondary | ICD-10-CM | POA: Diagnosis not present

## 2018-12-20 DIAGNOSIS — E782 Mixed hyperlipidemia: Secondary | ICD-10-CM

## 2018-12-20 DIAGNOSIS — Z853 Personal history of malignant neoplasm of breast: Secondary | ICD-10-CM | POA: Diagnosis not present
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
## 2018-12-20 NOTE — Patient Instructions (Signed)
Health Maintenance, Female Adopting a healthy lifestyle and getting preventive care are important in promoting health and wellness. Ask your health care provider about:  The right schedule for you to have regular tests and exams.  Things you can do on your own to prevent diseases and keep yourself healthy. What should I know about diet, weight, and exercise? Eat a healthy diet   Eat a diet that includes plenty of vegetables, fruits, low-fat dairy products, and lean protein.  Do not eat a lot of foods that are high in solid fats, added sugars, or sodium. Maintain a healthy weight Body mass index (BMI) is used to identify weight problems. It estimates body fat based on height and weight. Your health care provider can help determine your BMI and help you achieve or maintain a healthy weight. Get regular exercise Get regular exercise. This is one of the most important things you can do for your health. Most adults should:  Exercise for at least 150 minutes each week. The exercise should increase your heart rate and make you sweat (moderate-intensity exercise).  Do strengthening exercises at least twice a week. This is in addition to the moderate-intensity exercise.  Spend less time sitting. Even light physical activity can be beneficial. Watch cholesterol and blood lipids Have your blood tested for lipids and cholesterol at 49 years of age, then have this test every 5 years. Have your cholesterol levels checked more often if:  Your lipid or cholesterol levels are high.  You are older than 49 years of age.  You are at high risk for heart disease. What should I know about cancer screening? Depending on your health history and family history, you may need to have cancer screening at various ages. This may include screening for:  Breast cancer.  Cervical cancer.  Colorectal cancer.  Skin cancer.  Lung cancer. What should I know about heart disease, diabetes, and high blood  pressure? Blood pressure and heart disease  High blood pressure causes heart disease and increases the risk of stroke. This is more likely to develop in people who have high blood pressure readings, are of African descent, or are overweight.  Have your blood pressure checked: ? Every 3-5 years if you are 18-39 years of age. ? Every year if you are 40 years old or older. Diabetes Have regular diabetes screenings. This checks your fasting blood sugar level. Have the screening done:  Once every three years after age 40 if you are at a normal weight and have a low risk for diabetes.  More often and at a younger age if you are overweight or have a high risk for diabetes. What should I know about preventing infection? Hepatitis B If you have a higher risk for hepatitis B, you should be screened for this virus. Talk with your health care provider to find out if you are at risk for hepatitis B infection. Hepatitis C Testing is recommended for:  Everyone born from 1945 through 1965.  Anyone with known risk factors for hepatitis C. Sexually transmitted infections (STIs)  Get screened for STIs, including gonorrhea and chlamydia, if: ? You are sexually active and are younger than 49 years of age. ? You are older than 49 years of age and your health care provider tells you that you are at risk for this type of infection. ? Your sexual activity has changed since you were last screened, and you are at increased risk for chlamydia or gonorrhea. Ask your health care provider if   you are at risk.  Ask your health care provider about whether you are at high risk for HIV. Your health care provider may recommend a prescription medicine to help prevent HIV infection. If you choose to take medicine to prevent HIV, you should first get tested for HIV. You should then be tested every 3 months for as long as you are taking the medicine. Pregnancy  If you are about to stop having your period (premenopausal) and  you may become pregnant, seek counseling before you get pregnant.  Take 400 to 800 micrograms (mcg) of folic acid every day if you become pregnant.  Ask for birth control (contraception) if you want to prevent pregnancy. Osteoporosis and menopause Osteoporosis is a disease in which the bones lose minerals and strength with aging. This can result in bone fractures. If you are 65 years old or older, or if you are at risk for osteoporosis and fractures, ask your health care provider if you should:  Be screened for bone loss.  Take a calcium or vitamin D supplement to lower your risk of fractures.  Be given hormone replacement therapy (HRT) to treat symptoms of menopause. Follow these instructions at home: Lifestyle  Do not use any products that contain nicotine or tobacco, such as cigarettes, e-cigarettes, and chewing tobacco. If you need help quitting, ask your health care provider.  Do not use street drugs.  Do not share needles.  Ask your health care provider for help if you need support or information about quitting drugs. Alcohol use  Do not drink alcohol if: ? Your health care provider tells you not to drink. ? You are pregnant, may be pregnant, or are planning to become pregnant.  If you drink alcohol: ? Limit how much you use to 0-1 drink a day. ? Limit intake if you are breastfeeding.  Be aware of how much alcohol is in your drink. In the [place] one drink equals one 12 oz bottle of beer (355 mL), one 5 oz glass of wine (148 mL), or one 1 oz glass of hard liquor (44 mL). General instructions  Schedule regular health, dental, and eye exams.  Stay current with your vaccines.  Tell your health care provider if: ? You often feel depressed. ? You have ever been abused or do not feel safe at home. Summary  Adopting a healthy lifestyle and getting preventive care are important in promoting health and wellness.  Follow your health care provider's instructions about healthy  diet, exercising, and getting tested or screened for diseases.  Follow your health care provider's instructions on monitoring your cholesterol and blood pressure. This information is not intended to replace advice given to you by your health care provider. Make sure you discuss any questions you have with your health care provider. Document Released: 12/12/2010 Document Revised: 05/22/2018 Document Reviewed: 05/22/2018 Elsevier Patient Education  2020 [employer] Inc[employer]  

## 2018-12-24 DIAGNOSIS — E782 Mixed hyperlipidemia: Secondary | ICD-10-CM | POA: Diagnosis not present

## 2018-12-24 DIAGNOSIS — E559 Vitamin D deficiency, unspecified: Secondary | ICD-10-CM | POA: Diagnosis not present

## 2018-12-24 DIAGNOSIS — Z0000 Encounter for general adult medical examination without abnormal findings: Secondary | ICD-10-CM | POA: Diagnosis not present

## 2018-12-24 DIAGNOSIS — Z853 Personal history of malignant neoplasm of breast: Secondary | ICD-10-CM | POA: Diagnosis not present

## 2018-12-25 ENCOUNTER — Telehealth: Payer: Self-pay

## 2018-12-25 LAB — COMPREHENSIVE METABOLIC PANEL
ALT: 13 IU/L (ref 0–32)
AST: 19 IU/L (ref 0–40)
Albumin/Globulin Ratio: 2 (ref 1.2–2.2)
Albumin: 4.5 g/dL (ref 3.8–4.8)
Alkaline Phosphatase: 61 IU/L (ref 39–117)
BUN/Creatinine Ratio: 14 (ref 9–23)
BUN: 12 mg/dL (ref 6–24)
Bilirubin Total: 0.9 mg/dL (ref 0.0–1.2)
CO2: 25 mmol/L (ref 20–29)
Calcium: 9.8 mg/dL (ref 8.7–10.2)
Chloride: 99 mmol/L (ref 96–106)
Creatinine, Ser: 0.86 mg/dL (ref 0.57–1.00)
GFR calc Af Amer: 92 mL/min/{1.73_m2} (ref >59)
GFR calc non Af Amer: 80 mL/min/{1.73_m2} (ref >59)
Globulin, Total: 2.3 g/dL (ref 1.5–4.5)
Glucose: 91 mg/dL (ref 65–99)
Potassium: 4.4 mmol/L (ref 3.5–5.2)
Sodium: 143 mmol/L (ref 134–144)
Total Protein: 6.8 g/dL (ref 6.0–8.5)

## 2018-12-25 LAB — CBC WITH DIFFERENTIAL/PLATELET
Basophils Absolute: 0 10*3/uL (ref 0.0–0.2)
Basos: 1 %
EOS (ABSOLUTE): 0.1 10*3/uL (ref 0.0–0.4)
Eos: 2 %
Hematocrit: 39.7 % (ref 34.0–46.6)
Hemoglobin: 13.3 g/dL (ref 11.1–15.9)
Immature Grans (Abs): 0 10*3/uL (ref 0.0–0.1)
Immature Granulocytes: 0 %
Lymphocytes Absolute: 1.3 10*3/uL (ref 0.7–3.1)
Lymphs: 39 %
MCH: 30.2 pg (ref 26.6–33.0)
MCHC: 33.5 g/dL (ref 31.5–35.7)
MCV: 90 fL (ref 79–97)
Monocytes Absolute: 0.3 10*3/uL (ref 0.1–0.9)
Monocytes: 10 %
Neutrophils Absolute: 1.5 10*3/uL (ref 1.4–7.0)
Neutrophils: 48 %
Platelets: 195 10*3/uL (ref 150–450)
RBC: 4.4 x10E6/uL (ref 3.77–5.28)
RDW: 12.3 % (ref 11.7–15.4)
WBC: 3.2 10*3/uL — ABNORMAL LOW (ref 3.4–10.8)

## 2018-12-25 LAB — TSH: TSH: 1.93 u[IU]/mL (ref 0.450–4.500)

## 2018-12-25 LAB — LIPID PANEL
Chol/HDL Ratio: 3.9 ratio (ref 0.0–4.4)
Cholesterol, Total: 239 mg/dL — ABNORMAL HIGH (ref 100–199)
HDL: 62 mg/dL (ref >39)
LDL Calculated: 156 mg/dL — ABNORMAL HIGH (ref 0–99)
Triglycerides: 106 mg/dL (ref 0–149)
VLDL Cholesterol Cal: 21 mg/dL (ref 5–40)

## 2018-12-25 LAB — HEMOGLOBIN A1C
Est. average glucose Bld gHb Est-mCnc: 108 mg/dL
Hgb A1c MFr Bld: 5.4 % (ref 4.8–5.6)

## 2018-12-25 LAB — VITAMIN D 25 HYDROXY (VIT D DEFICIENCY, FRACTURES): Vit D, 25-Hydroxy: 47.3 ng/mL (ref 30.0–100.0)

## 2018-12-25 LAB — CANCER ANTIGEN 27.29: CA 27.29: 7.2 U/mL (ref 0.0–38.6)

## 2018-12-25 NOTE — Telephone Encounter (Signed)
-----   Message from Mar Daring, [place] sent at 12/25/2018  3:32 PM EDT ----- Blood count is stable and normal. Kidney and liver function are normal. Sodium, potassium and calcium are normal. Thyroid is normal. Cholesterol up slightly when compared to last year. A1c is normal. Vit D is normal. Cancer antigen is normal.

## 2018-12-25 NOTE — Telephone Encounter (Signed)
Viewed by Sharlyn Bologna on 12/25/2018 4:07 PM Written by Mar Daring, PA-C on 12/25/2018 3:32 PM Blood count is stable and normal. Kidney and liver function are normal. Sodium, potassium and calcium are normal. Thyroid is normal. Cholesterol up slightly when compared to last year. A1c is normal. Vit D is normal. Cancer antigen is normal.

## 2018-12-31 DIAGNOSIS — H524 Presbyopia: Secondary | ICD-10-CM | POA: Diagnosis not present

## 2018-12-31 DIAGNOSIS — H52223 Regular astigmatism, bilateral: Secondary | ICD-10-CM | POA: Diagnosis not present

## 2018-12-31 DIAGNOSIS — H5213 Myopia, bilateral: Secondary | ICD-10-CM | POA: Diagnosis not present

## 2019-01-16 ENCOUNTER — Encounter: Payer: Self-pay | Admitting: Family Medicine

## 2019-01-16 ENCOUNTER — Other Ambulatory Visit: Payer: Self-pay

## 2019-01-16 ENCOUNTER — Ambulatory Visit (INDEPENDENT_AMBULATORY_CARE_PROVIDER_SITE_OTHER): Payer: 59 | Admitting: Family Medicine

## 2019-01-16 VITALS — BP 110/84 | HR 67 | Ht 68.0 in | Wt 149.0 lb

## 2019-01-16 DIAGNOSIS — M999 Biomechanical lesion, unspecified: Secondary | ICD-10-CM | POA: Diagnosis not present

## 2019-01-16 DIAGNOSIS — M533 Sacrococcygeal disorders, not elsewhere classified: Secondary | ICD-10-CM

## 2019-01-16 NOTE — Assessment & Plan Note (Signed)
Still believe likely more sacroiliac, piriformis left gluteal tendon as well.  Has responded somewhat to injections previously.  Seems to still have some tightness in the hamstring.  Discussed with patient that icing regimen, home exercise, which activities of doing which wants to avoid.  Patient will increase activity slowly.  Follow-up again in 4 to 8 weeks.

## 2019-01-16 NOTE — Assessment & Plan Note (Signed)
Decision today to treat with OMT was based on Physical Exam  After verbal consent patient was treated with HVLA, ME, FPR techniques in cervical, thoracic, lumbar and sacral areas  Patient tolerated the procedure well with improvement in symptoms  Patient given exercises, stretches and lifestyle modifications  See medications in patient instructions if given  Patient will follow up in 4-8 weeks 

## 2019-01-16 NOTE — Patient Instructions (Signed)
See me again in 6 weeks 

## 2019-01-16 NOTE — Progress Notes (Signed)
[name] [address] [place], [place] [address] Phone: [phone] Subjective:   Kendra [name], am serving as a scribe for Dr. Hulan Saas.   CC: Back pain follow-up  [id number]   11/29/2018-OMT  Update 01/16/2019 Kendra [name] is a 48 y.o. female coming in with complaint of back pain. Can tell she is having tightness. Continually battling hamstring pain. Back pain is more on right.  Patient describes the pain as a dull, throbbing aching pain.  Still with pain on the hamstring and anything else.  Patient tenderness or any radiation of pain.  States that repetitive flexion can cause some discomfort as well.     Past Medical History:  Diagnosis Date  . Allergy   . Breast cancer ([place]) left   2004 with chemo tx  . Osteoporosis   . Personal history of chemotherapy    Past Surgical History:  Procedure Laterality Date  . BREAST SURGERY     implant left in 2004, and biopsy  . MASTECTOMY Left 01/07/03   with tram flap and right breast lift in 2008  . TOTAL ABDOMINAL HYSTERECTOMY     related to breast cancer  . TOTAL ABDOMINAL HYSTERECTOMY W/ BILATERAL SALPINGOOPHORECTOMY  10/2003   Social History   Socioeconomic History  . Marital status: Married    Spouse name: Rhodie Cienfuegos  . Number of children: 2  . Years of education: Not on file  . Highest education level: Not on file  Occupational History  . Not on file  Social Needs  . Financial resource strain: Not on file  . Food insecurity    Worry: Not on file    Inability: Not on file  . Transportation needs    Medical: Not on file    Non-medical: Not on file  Tobacco Use  . Smoking status: Never Smoker  . Smokeless tobacco: Never Used  Substance and Sexual Activity  . Alcohol use: [name]  . Drug use: [name]  . Sexual activity: Yes    Birth control/protection: Other-see comments    Comment: had total hysterectomy  Lifestyle  . Physical activity    Days per week: Not on file    Minutes  per session: Not on file  . Stress: Not on file  Relationships  . Social Herbalist on phone: Not on file    Gets together: Not on file    Attends religious service: Not on file    Active member of club or organization: Not on file    Attends meetings of clubs or organizations: Not on file    Relationship status: Not on file  Other Topics Concern  . Not on file  Social History Narrative  . Not on file   [name] Known Allergies Family History  Problem Relation Age of Onset  . Arthritis Mother   . Hypothyroidism Mother   . Breast cancer Mother 20  . Alcohol abuse Brother      Current Outpatient Medications (Cardiovascular):  .  nitroGLYCERIN (NITRODUR - DOSED IN MG/24 HR) 0.2 mg/hr patch, 1/4 patch daily  Current Outpatient Medications (Respiratory):  .  fluticasone (FLONASE) 50 MCG/ACT nasal spray, PLACE 1 SPRAY INTO BOTH NOSTRILS DAILY. Marland Kitchen  loratadine-pseudoephedrine (CLARITIN-D 24 HOUR) 10-240 MG 24 hr tablet, Take 1 tablet by mouth daily.    Current Outpatient Medications (Other):  Marland Kitchen  Calcium-Vitamin D 600-200 MG-UNIT per tablet, Take 1 tablet by mouth daily.  .  cyclobenzaprine (FLEXERIL) 10 MG  tablet, Take 1 tablet (10 mg total) by mouth 3 (three) times daily as needed for muscle spasms. .  hydrocortisone (ANUSOL-HC) 2.5 % rectal cream, Place 1 application rectally 2 (two) times daily. .  valACYclovir (VALTREX) 1000 MG tablet, Take 2 tablets (2,000 mg total) by mouth 2 (two) times daily. At onset of cold sore .  VITAMIN D, CHOLECALCIFEROL, PO, Take 1 capsule by mouth daily. .  Vitamin D, Ergocalciferol, (DRISDOL) 50000 units CAPS capsule, Take 1 capsule (50,000 Units total) by mouth every 7 (seven) days.    Past medical history, social, surgical and family history all reviewed in electronic medical record.  [name] pertanent information unless stated regarding to the chief complaint.   Review of Systems:  [name] headache, visual changes, nausea, vomiting, diarrhea,  constipation, dizziness, abdominal pain, skin rash, fevers, chills, night sweats, weight loss, swollen lymph nodes, body aches, joint swelling,  chest pain, shortness of breath, mood changes.  Positive muscle aches  Objective  Blood pressure 110/84, pulse 67, height 5\' 8"  (1.727 m), weight 149 lb (67.6 kg), SpO2 99 %.   General: [name] apparent distress alert and oriented x3 mood and affect normal, dressed appropriately.  HEENT: Pupils equal, extraocular movements intact  Respiratory: Patient's speak in full sentences and does not appear short of breath  Cardiovascular: [name] lower extremity edema, non tender, [name] erythema  Skin: Warm dry intact with [name] signs of infection or rash on extremities or on axial skeleton.  Abdomen: Soft nontender  Neuro: Cranial nerves II through XII are intact, neurovascularly intact in all extremities with 2+ DTRs and 2+ pulses.  Lymph: [name] lymphadenopathy of posterior or anterior cervical chain or axillae bilaterally.  Gait normal with good balance and coordination.  MSK:  tender with limited range of motion and good stability and symmetric strength and tone of shoulders, elbows, wrist, hip, knee and ankles bilaterally.  Back exam does have some loss of lordosis.  Patient does have some tightness of the hamstrings bilaterally.  Mild pain still over the piriformis on the right greater than left.  Some pain in the quadratus lumborum as well.  Patient's otherwise fairly unremarkable. Osteopathic findings  C2 flexed rotated and side bent rightt T5 extended rotated and side bent right inhaled rib T9 extended rotated and side bent left L1 flexed rotated and side bent right Sacrum left on left     Impression and Recommendations:     This case required medical decision making of moderate complexity. The above documentation has been reviewed and is accurate and complete Lyndal Pulley, DO       Note: This dictation was prepared with Dragon dictation along with smaller  phrase technology. Any transcriptional errors that result from this process are unintentional.

## 2019-02-03 ENCOUNTER — Encounter: Payer: Self-pay | Admitting: Family Medicine

## 2019-02-27 ENCOUNTER — Encounter: Payer: Self-pay | Admitting: Family Medicine

## 2019-02-27 ENCOUNTER — Ambulatory Visit (INDEPENDENT_AMBULATORY_CARE_PROVIDER_SITE_OTHER): Payer: 59 | Admitting: Family Medicine

## 2019-02-27 VITALS — BP 110/64 | HR 65 | Ht 68.0 in | Wt 150.0 lb

## 2019-02-27 DIAGNOSIS — M76891 Other specified enthesopathies of right lower limb, excluding foot: Secondary | ICD-10-CM | POA: Diagnosis not present

## 2019-02-27 DIAGNOSIS — M5416 Radiculopathy, lumbar region: Secondary | ICD-10-CM | POA: Diagnosis not present

## 2019-02-27 DIAGNOSIS — M999 Biomechanical lesion, unspecified: Secondary | ICD-10-CM | POA: Diagnosis not present

## 2019-02-27 NOTE — Assessment & Plan Note (Signed)
Known degenerative disc disease.  Discussed with patient about icing regimen and home exercise,

## 2019-02-27 NOTE — Progress Notes (Signed)
[name] [address] [place], [place] [address] Phone: [phone] Subjective:   Kendra [name], am serving as a scribe for Dr. Hulan Saas.    CC: Back pain follow-up  [id number]   01/16/2019 Still believe likely more sacroiliac, piriformis left gluteal tendon as well.  Has responded somewhat to injections previously.  Seems to still have some tightness in the hamstring.  Discussed with patient that icing regimen, home exercise, which activities of doing which wants to avoid.  Patient will increase activity slowly.  Follow-up again in 4 to 8 weeks.  Update 02/27/2019 Kendra [name] is a 48 y.o. female coming in with complaint of right hamstring pain. Continues to have tightness in the hamstring. Is getting massages and stretching.      Past Medical History:  Diagnosis Date  . Allergy   . Breast cancer ([place]) left   2004 with chemo tx  . Osteoporosis   . Personal history of chemotherapy    Past Surgical History:  Procedure Laterality Date  . BREAST SURGERY     implant left in 2004, and biopsy  . MASTECTOMY Left 01/07/03   with tram flap and right breast lift in 2008  . TOTAL ABDOMINAL HYSTERECTOMY     related to breast cancer  . TOTAL ABDOMINAL HYSTERECTOMY W/ BILATERAL SALPINGOOPHORECTOMY  10/2003   Social History   Socioeconomic History  . Marital status: Married    Spouse name: Ameira Bootz  . Number of children: 2  . Years of education: Not on file  . Highest education level: Not on file  Occupational History  . Not on file  Social Needs  . Financial resource strain: Not on file  . Food insecurity    Worry: Not on file    Inability: Not on file  . Transportation needs    Medical: Not on file    Non-medical: Not on file  Tobacco Use  . Smoking status: Never Smoker  . Smokeless tobacco: Never Used  Substance and Sexual Activity  . Alcohol use: [name]  . Drug use: [name]  . Sexual activity: Yes    Birth control/protection:  Other-see comments    Comment: had total hysterectomy  Lifestyle  . Physical activity    Days per week: Not on file    Minutes per session: Not on file  . Stress: Not on file  Relationships  . Social Herbalist on phone: Not on file    Gets together: Not on file    Attends religious service: Not on file    Active member of club or organization: Not on file    Attends meetings of clubs or organizations: Not on file    Relationship status: Not on file  Other Topics Concern  . Not on file  Social History Narrative  . Not on file   [name] Known Allergies Family History  Problem Relation Age of Onset  . Arthritis Mother   . Hypothyroidism Mother   . Breast cancer Mother 45  . Alcohol abuse Brother      Current Outpatient Medications (Cardiovascular):  .  nitroGLYCERIN (NITRODUR - DOSED IN MG/24 HR) 0.2 mg/hr patch, 1/4 patch daily  Current Outpatient Medications (Respiratory):  .  fluticasone (FLONASE) 50 MCG/ACT nasal spray, PLACE 1 SPRAY INTO BOTH NOSTRILS DAILY. Marland Kitchen  loratadine-pseudoephedrine (CLARITIN-D 24 HOUR) 10-240 MG 24 hr tablet, Take 1 tablet by mouth daily.    Current Outpatient Medications (Other):  Marland Kitchen  Calcium-Vitamin  D 600-200 MG-UNIT per tablet, Take 1 tablet by mouth daily.  .  cyclobenzaprine (FLEXERIL) 10 MG tablet, Take 1 tablet (10 mg total) by mouth 3 (three) times daily as needed for muscle spasms. .  hydrocortisone (ANUSOL-HC) 2.5 % rectal cream, Place 1 application rectally 2 (two) times daily. .  valACYclovir (VALTREX) 1000 MG tablet, Take 2 tablets (2,000 mg total) by mouth 2 (two) times daily. At onset of cold sore .  VITAMIN D, CHOLECALCIFEROL, PO, Take 1 capsule by mouth daily. .  Vitamin D, Ergocalciferol, (DRISDOL) 50000 units CAPS capsule, Take 1 capsule (50,000 Units total) by mouth every 7 (seven) days.    Past medical history, social, surgical and family history all reviewed in electronic medical record.  [name] pertanent information  unless stated regarding to the chief complaint.   Review of Systems:  [name] headache, visual changes, nausea, vomiting, diarrhea, constipation, dizziness, abdominal pain, skin rash, fevers, chills, night sweats, weight loss, swollen lymph nodes, body aches, joint swelling,  chest pain, shortness of breath, mood changes.  Positive muscle aches  Objective  Blood pressure 110/64, pulse 65, height 5\' 8"  (1.727 m), weight 150 lb (68 kg), SpO2 98 %.    General: [name] apparent distress alert and oriented x3 mood and affect normal, dressed appropriately.  HEENT: Pupils equal, extraocular movements intact  Respiratory: Patient's speak in full sentences and does not appear short of breath  Cardiovascular: [name] lower extremity edema, non tender, [name] erythema  Skin: Warm dry intact with [name] signs of infection or rash on extremities or on axial skeleton.  Abdomen: Soft nontender  Neuro: Cranial nerves II through XII are intact, neurovascularly intact in all extremities with 2+ DTRs and 2+ pulses.  Lymph: [name] lymphadenopathy of posterior or anterior cervical chain or axillae bilaterally.  Gait normal with good balance and coordination.  MSK:  tender with full range of motion and good stability and symmetric strength and tone of shoulders, elbows, wrist, hip, knee and ankles bilaterally.   Back exam shows some loss of lordosis.  Patient does have some tenderness to palpation paraspinal musculature right greater than left.  Tightness of the hamstring on the right side.  Tender to palpation over the ischio bursa area.  Mild tightness with Corky Sox test as well.  Osteopathic findings  C6 flexed rotated and side bent left T3 extended rotated and side bent right inhaled third rib T9 extended rotated and side bent left L4 flexed rotated and side bent right Sacrum right on right     Impression and Recommendations:     This case required medical decision making of moderate complexity. The above documentation has been  reviewed and is accurate and complete Lyndal Pulley, DO       Note: This dictation was prepared with Dragon dictation along with smaller phrase technology. Any transcriptional errors that result from this process are unintentional.

## 2019-02-27 NOTE — Assessment & Plan Note (Signed)
Decision today to treat with OMT was based on Physical Exam  After verbal consent patient was treated with HVLA, ME, FPR techniques in cervical, thoracic, rib lumbar and sacral areas  Patient tolerated the procedure well with improvement in symptoms  Patient given exercises, stretches and lifestyle modifications  See medications in patient instructions if given  Patient will follow up in 4-8 weeks 

## 2019-02-27 NOTE — Patient Instructions (Signed)
Thigh compression with working out See me in 6 weeks, if not better will inject hamstring

## 2019-02-27 NOTE — Assessment & Plan Note (Signed)
Patient's pain seems to be more of the issue area.  Has had significant difficulty with his right hip completely.  Patient's MRI results did show some hamstring tendinitis initially.  New exercises, home exercises, which activities of doing which wants to avoid.  Patient is currently with 5 compression sleeve.  Discussed which activities of doing which wants to avoid.  Patient does respond well to manipulation for the lower back pain.  Follow-up again in 4 to 8 weeks

## 2019-04-01 ENCOUNTER — Encounter: Payer: Self-pay | Admitting: Family Medicine

## 2019-04-03 ENCOUNTER — Other Ambulatory Visit: Payer: Self-pay

## 2019-04-03 ENCOUNTER — Ambulatory Visit (INDEPENDENT_AMBULATORY_CARE_PROVIDER_SITE_OTHER): Payer: 59 | Admitting: Family Medicine

## 2019-04-03 ENCOUNTER — Encounter: Payer: Self-pay | Admitting: Family Medicine

## 2019-04-03 ENCOUNTER — Ambulatory Visit: Payer: Self-pay

## 2019-04-03 VITALS — BP 100/66 | HR 68 | Ht 68.0 in | Wt 149.0 lb

## 2019-04-03 DIAGNOSIS — M79604 Pain in right leg: Secondary | ICD-10-CM

## 2019-04-03 DIAGNOSIS — M999 Biomechanical lesion, unspecified: Secondary | ICD-10-CM

## 2019-04-03 DIAGNOSIS — M76891 Other specified enthesopathies of right lower limb, excluding foot: Secondary | ICD-10-CM | POA: Diagnosis not present

## 2019-04-03 NOTE — Patient Instructions (Addendum)
Injected hamstring today Can workout on Monday See me in 4-5 weeks

## 2019-04-03 NOTE — Assessment & Plan Note (Signed)
Patient responded well to the injection.  Discussed icing regimen and home exercises.  And still did manipulation.  Patient will follow-up again in 4 to 8 weeks see how patient does.

## 2019-04-03 NOTE — Progress Notes (Signed)
[name] [address], [place] [address] Phone: [phone] Subjective:   [name], am serving as a scribe for Dr. Hulan Saas.   CC: Back pain, hip pain follow-up  [id number]    02/27/19: R h/s: Patient's pain seems to be more of the issue area.  Has had significant difficulty with his right hip completely.  Patient's MRI results did show some hamstring tendinitis initially.  New exercises, home exercises, which activities of doing which wants to avoid.  Patient is currently with 5 compression sleeve.  Discussed which activities of doing which wants to avoid.  Patient does respond well to manipulation for the lower back pain.  Follow-up again in 4 to 8 weeks  Low back: Known degenerative disc disease.  Discussed with patient about icing regimen and home exercise,  Update- 04/03/19 Kendra [name] is a 48 y.o. female coming in with complaint of back and hamstring pain. No increase in her back or hamstring pain. Does feel tightness in the hamstrings. Is using [place] exercises. Even in a seated position is feeling a tension in the right hamstring. Has been active and is using ice more often. Wearing compression shorts for workouts. Has tried nitro patches on hamstring which she feels is helping. Hips are not popping as much as they had been.       Past Medical History:  Diagnosis Date  . Allergy   . Breast cancer ([place]) left   2004 with chemo tx  . Osteoporosis   . Personal history of chemotherapy    Past Surgical History:  Procedure Laterality Date  . BREAST SURGERY     implant left in 2004, and biopsy  . MASTECTOMY Left 01/07/03   with tram flap and right breast lift in 2008  . TOTAL ABDOMINAL HYSTERECTOMY     related to breast cancer  . TOTAL ABDOMINAL HYSTERECTOMY W/ BILATERAL SALPINGOOPHORECTOMY  10/2003   Social History   Socioeconomic History  . Marital status: Married    Spouse name: Camryn Risse  . Number of  children: 2  . Years of education: Not on file  . Highest education level: Not on file  Occupational History  . Not on file  Social Needs  . Financial resource strain: Not on file  . Food insecurity    Worry: Not on file    Inability: Not on file  . Transportation needs    Medical: Not on file    Non-medical: Not on file  Tobacco Use  . Smoking status: Never Smoker  . Smokeless tobacco: Never Used  Substance and Sexual Activity  . Alcohol use: No  . Drug use: No  . Sexual activity: Yes    Birth control/protection: Other-see comments    Comment: had total hysterectomy  Lifestyle  . Physical activity    Days per week: Not on file    Minutes per session: Not on file  . Stress: Not on file  Relationships  . Social Herbalist on phone: Not on file    Gets together: Not on file    Attends religious service: Not on file    Active member of club or organization: Not on file    Attends meetings of clubs or organizations: Not on file    Relationship status: Not on file  Other Topics Concern  . Not on file  Social History Narrative  . Not on file   No Known Allergies Family History  Problem  Relation Age of Onset  . Arthritis Mother   . Hypothyroidism Mother   . Breast cancer Mother 51  . Alcohol abuse Brother      Current Outpatient Medications (Cardiovascular):  .  nitroGLYCERIN (NITRODUR - DOSED IN MG/24 HR) 0.2 mg/hr patch, 1/4 patch daily  Current Outpatient Medications (Respiratory):  .  fluticasone (FLONASE) 50 MCG/ACT nasal spray, PLACE 1 SPRAY INTO BOTH NOSTRILS DAILY. Marland Kitchen  loratadine-pseudoephedrine (CLARITIN-D 24 HOUR) 10-240 MG 24 hr tablet, Take 1 tablet by mouth daily.    Current Outpatient Medications (Other):  Marland Kitchen  Calcium-Vitamin D 600-200 MG-UNIT per tablet, Take 1 tablet by mouth daily.  .  cyclobenzaprine (FLEXERIL) 10 MG tablet, Take 1 tablet (10 mg total) by mouth 3 (three) times daily as needed for muscle spasms. .  hydrocortisone  (ANUSOL-HC) 2.5 % rectal cream, Place 1 application rectally 2 (two) times daily. .  valACYclovir (VALTREX) 1000 MG tablet, Take 2 tablets (2,000 mg total) by mouth 2 (two) times daily. At onset of cold sore .  VITAMIN D, CHOLECALCIFEROL, PO, Take 1 capsule by mouth daily. .  Vitamin D, Ergocalciferol, (DRISDOL) 50000 units CAPS capsule, Take 1 capsule (50,000 Units total) by mouth every 7 (seven) days.    Past medical history, social, surgical and family history all reviewed in electronic medical record.  No pertanent information unless stated regarding to the chief complaint.   Review of Systems:  No headache, visual changes, nausea, vomiting, diarrhea, constipation, dizziness, abdominal pain, skin rash, fevers, chills, night sweats, weight loss, swollen lymph nodes, body aches, joint swelling, muscle aches, chest pain, shortness of breath, mood changes.   Objective  Blood pressure 100/66, pulse 68, height 5\' 8"  (1.727 m), weight 149 lb (67.6 kg), SpO2 97 %.    General: No apparent distress alert and oriented x3 mood and affect normal, dressed appropriately.  HEENT: Pupils equal, extraocular movements intact  Respiratory: Patient's speak in full sentences and does not appear short of breath  Cardiovascular: No lower extremity edema, non tender, no erythema  Skin: Warm dry intact with no signs of infection or rash on extremities or on axial skeleton.  Abdomen: Soft nontender  Neuro: Cranial nerves II through XII are intact, neurovascularly intact in all extremities with 2+ DTRs and 2+ pulses.  Lymph: No lymphadenopathy of posterior or anterior cervical chain or axillae bilaterally.  Gait normal with good balance and coordination.  MSK:  tender with full range of motion and good stability and symmetric strength and tone of shoulders, elbows, wrist, , knee and ankles bilaterally.   Right hip exam continues and tender to palpation mostly over the Hamstring tendon proximally.  More over the  ischial bursitis.  Back exam mild loss of lordosis, tightness noted in the paraspinal musculature lumbar spine.  Negative straight leg test but tightness in the hamstring   Procedure: Real-time Ultrasound Guided Injection of right hamstring tendon sheath injection Device: GE Logiq Q7 Ultrasound guided injection is preferred based studies that show increased duration, increased effect, greater accuracy, decreased procedural pain, increased response rate, and decreased cost with ultrasound guided versus blind injection.  Verbal informed consent obtained.  Time-out conducted.  Noted no overlying erythema, induration, or other signs of local infection.  Skin prepped in a sterile fashion.  Local anesthesia: Topical Ethyl chloride.  With sterile technique and under real time ultrasound guidance: The 21-gauge 2 inch needle injected with 0.5 cc of 0.5% Marcaine and 1 cc of Kenalog 40 mg/mL Completed without difficulty  Pain  immediately resolved suggesting accurate placement of the medication.  Advised to call if fevers/chills, erythema, induration, drainage, or persistent bleeding.  Images permanently stored and available for review in the ultrasound unit.  Impression: Technically successful ultrasound guided injection.  Osteopathic findings  C2 flexed rotated and side bent right C5 flexed rotated and side bent left T3 extended rotated and side bent right inhaled third rib T7 extended rotated and side bent left L2 flexed rotated and side bent right Sacrum right on right    Impression and Recommendations:     This case required medical decision making of moderate complexity. The above documentation has been reviewed and is accurate and complete Lyndal Pulley, DO       Note: This dictation was prepared with Dragon dictation along with smaller phrase technology. Any transcriptional errors that result from this process are unintentional.

## 2019-04-03 NOTE — Assessment & Plan Note (Signed)
Decision today to treat with OMT was based on Physical Exam  After verbal consent patient was treated with HVLA, ME, FPR techniques in cervical, thoracic, rib lumbar and sacral areas  Patient tolerated the procedure well with improvement in symptoms  Patient given exercises, stretches and lifestyle modifications  See medications in patient instructions if given  Patient will follow up in 4-8 weeks 

## 2019-04-04 ENCOUNTER — Ambulatory Visit: Payer: 59 | Admitting: Family Medicine

## 2019-04-09 ENCOUNTER — Encounter: Payer: Self-pay | Admitting: Family Medicine

## 2019-05-12 ENCOUNTER — Ambulatory Visit (INDEPENDENT_AMBULATORY_CARE_PROVIDER_SITE_OTHER): Payer: 59 | Admitting: Family Medicine

## 2019-05-12 ENCOUNTER — Encounter: Payer: Self-pay | Admitting: Family Medicine

## 2019-05-12 ENCOUNTER — Other Ambulatory Visit: Payer: Self-pay

## 2019-05-12 VITALS — BP 108/68 | HR 73 | Ht 68.0 in

## 2019-05-12 DIAGNOSIS — M76891 Other specified enthesopathies of right lower limb, excluding foot: Secondary | ICD-10-CM

## 2019-05-12 DIAGNOSIS — M999 Biomechanical lesion, unspecified: Secondary | ICD-10-CM | POA: Diagnosis not present

## 2019-05-12 NOTE — Assessment & Plan Note (Signed)
Decision today to treat with OMT was based on Physical Exam  After verbal consent patient was treated with HVLA, ME, FPR techniques in cervical, thoracic, rib lumbar and sacral areas  Patient tolerated the procedure well with improvement in symptoms  Patient given exercises, stretches and lifestyle modifications  See medications in patient instructions if given  Patient will follow up in 4-8 weeks 

## 2019-05-12 NOTE — Progress Notes (Signed)
[name] [place], [place] [address] Phone: [phone] Subjective:   [name], am serving as a scribe for Dr. Hulan Saas.  CC: Low back pain follow-up  This visit occurred during the SARS-CoV-2 public health emergency.  Safety protocols were in place, including screening questions prior to the visit, additional usage of staff PPE, and extensive cleaning of exam room while observing appropriate contact time as indicated for disinfecting solutions.     [id number]  Kendra [name] is a 48 y.o. female coming in with complaint of low back pain.  The patient has had more of a hamstring tendinopathy.  Increasing recently.  Patient has had some tightness overall.  Patient feels if she would increase activity she may have more pain over the hamstring but does feel that the injection was helpful.     Past Medical History:  Diagnosis Date  . Allergy   . Breast cancer ([place]) left   2004 with chemo tx  . Osteoporosis   . Personal history of chemotherapy    Past Surgical History:  Procedure Laterality Date  . BREAST SURGERY     implant left in 2004, and biopsy  . MASTECTOMY Left 01/07/03   with tram flap and right breast lift in 2008  . TOTAL ABDOMINAL HYSTERECTOMY     related to breast cancer  . TOTAL ABDOMINAL HYSTERECTOMY W/ BILATERAL SALPINGOOPHORECTOMY  10/2003   Social History   Socioeconomic History  . Marital status: Married    Spouse name: Curtistine Poppa  . Number of children: 2  . Years of education: Not on file  . Highest education level: Not on file  Occupational History  . Not on file  Social Needs  . Financial resource strain: Not on file  . Food insecurity    Worry: Not on file    Inability: Not on file  . Transportation needs    Medical: Not on file    Non-medical: Not on file  Tobacco Use  . Smoking status: Never Smoker  . Smokeless tobacco: Never Used  Substance and Sexual Activity  . Alcohol use: No  .  Drug use: No  . Sexual activity: Yes    Birth control/protection: Other-see comments    Comment: had total hysterectomy  Lifestyle  . Physical activity    Days per week: Not on file    Minutes per session: Not on file  . Stress: Not on file  Relationships  . Social Herbalist on phone: Not on file    Gets together: Not on file    Attends religious service: Not on file    Active member of club or organization: Not on file    Attends meetings of clubs or organizations: Not on file    Relationship status: Not on file  Other Topics Concern  . Not on file  Social History Narrative  . Not on file   No Known Allergies Family History  Problem Relation Age of Onset  . Arthritis Mother   . Hypothyroidism Mother   . Breast cancer Mother 11  . Alcohol abuse Brother      Current Outpatient Medications (Cardiovascular):  .  nitroGLYCERIN (NITRODUR - DOSED IN MG/24 HR) 0.2 mg/hr patch, 1/4 patch daily  Current Outpatient Medications (Respiratory):  .  fluticasone (FLONASE) 50 MCG/ACT nasal spray, PLACE 1 SPRAY INTO BOTH NOSTRILS DAILY. Marland Kitchen  loratadine-pseudoephedrine (CLARITIN-D 24 HOUR) 10-240 MG 24 hr tablet, Take 1 tablet by  mouth daily.    Current Outpatient Medications (Other):  Marland Kitchen  Calcium-Vitamin D 600-200 MG-UNIT per tablet, Take 1 tablet by mouth daily.  .  cyclobenzaprine (FLEXERIL) 10 MG tablet, Take 1 tablet (10 mg total) by mouth 3 (three) times daily as needed for muscle spasms. .  hydrocortisone (ANUSOL-HC) 2.5 % rectal cream, Place 1 application rectally 2 (two) times daily. .  valACYclovir (VALTREX) 1000 MG tablet, Take 2 tablets (2,000 mg total) by mouth 2 (two) times daily. At onset of cold sore .  VITAMIN D, CHOLECALCIFEROL, PO, Take 1 capsule by mouth daily. .  Vitamin D, Ergocalciferol, (DRISDOL) 50000 units CAPS capsule, Take 1 capsule (50,000 Units total) by mouth every 7 (seven) days.    Past medical history, social, surgical and family history all  reviewed in electronic medical record.  No pertanent information unless stated regarding to the chief complaint.   Review of Systems:  No headache, visual changes, nausea, vomiting, diarrhea, constipation, dizziness, abdominal pain, skin rash, fevers, chills, night sweats, weight loss, swollen lymph nodes, body aches, joint swelling, muscle aches, chest pain, shortness of breath, mood changes.   Objective  Blood pressure 108/68, pulse 73, height 5\' 8"  (1.727 m), SpO2 97 %.    General: No apparent distress alert and oriented x3 mood and affect normal, dressed appropriately.  HEENT: Pupils equal, extraocular movements intact  Respiratory: Patient's speak in full sentences and does not appear short of breath  Cardiovascular: No lower extremity edema, non tender, no erythema  Skin: Warm dry intact with no signs of infection or rash on extremities or on axial skeleton.  Abdomen: Soft nontender  Neuro: Cranial nerves II through XII are intact, neurovascularly intact in all extremities with 2+ DTRs and 2+ pulses.  Lymph: No lymphadenopathy of posterior or anterior cervical chain or axillae bilaterally.  Gait normal with good balance and coordination.  MSK:  Non tender with full range of motion and good stability and symmetric strength and tone of shoulders, elbows, wrist, hip, knee and ankles bilaterally.  Back Exam:  Inspection: Mild loss of lordosis Motion: Flexion 45 deg, Extension 35 deg, Side Bending to 45 deg bilaterally,  Rotation to 45 deg bilaterally  SLR laying: Negative tightness in the hamstring right greater than left XSLR laying: Negative  Palpable tenderness: Positive muscle aches. FABER: Tightness bilaterally. Sensory change: Gross sensation intact to all lumbar and sacral dermatomes.  Reflexes: 2+ at both patellar tendons, 2+ at achilles tendons, Babinski's downgoing.  Strength at foot  Plantar-flexion: 5/5 Dorsi-flexion: 5/5 Eversion: 5/5 Inversion: 5/5  Leg strength  Quad:  5/5 Hamstring: 5/5 Hip flexor: 5/5 Hip abductors: 5/5  Gait unremarkable.  Osteopathic findings C2 flexed rotated and side bent right C7 flexed rotated and side bent left T3 extended rotated and side bent right inhaled third rib T9 extended rotated and side bent left L2 flexed rotated and side bent right Sacrum right on right    Impression and Recommendations:     This case required medical decision making of moderate complexity. The above documentation has been reviewed and is accurate and complete Lyndal Pulley, DO       Note: This dictation was prepared with Dragon dictation along with smaller phrase technology. Any transcriptional errors that result from this process are unintentional.

## 2019-05-12 NOTE — Assessment & Plan Note (Signed)
Patient was injected previously.  Has been making some improvement.  Has failed epidurals in the past and have tried gluteal tendon injections hopefully this will be beneficial and will consider the possibility of PRP if this comes back with patient having such good relief with the gluteal tendon injection.  Discussed icing regimen and home exercises, follow-up again in 4 to 6 weeks

## 2019-05-12 NOTE — Patient Instructions (Signed)
Increases exercises 3x a week Avoid full extension  Call when you decide on PRP See me again in 6 weeks

## 2019-06-10 ENCOUNTER — Ambulatory Visit (INDEPENDENT_AMBULATORY_CARE_PROVIDER_SITE_OTHER): Payer: 59 | Admitting: Family Medicine

## 2019-06-10 ENCOUNTER — Encounter: Payer: Self-pay | Admitting: Family Medicine

## 2019-06-10 ENCOUNTER — Other Ambulatory Visit: Payer: Self-pay

## 2019-06-10 VITALS — BP 100/60 | HR 75 | Ht 68.0 in | Wt 151.0 lb

## 2019-06-10 DIAGNOSIS — M999 Biomechanical lesion, unspecified: Secondary | ICD-10-CM

## 2019-06-10 DIAGNOSIS — M533 Sacrococcygeal disorders, not elsewhere classified: Secondary | ICD-10-CM | POA: Diagnosis not present

## 2019-06-10 NOTE — Assessment & Plan Note (Signed)
Decision today to treat with OMT was based on Physical Exam  After verbal consent patient was treated with HVLA, ME techniques in Cervical, thoracic, lumbar, sacral and rib areas  Patient tolerated the procedure well with improvement in symptoms  Patient given exercises, stretches and lifestyle modifications  See medications in patient instructions if given  Patient will follow up in 6 weeks

## 2019-06-10 NOTE — Assessment & Plan Note (Signed)
Continued multiple problems, including the hamstring, back, SI joint, gluteal tendon, responding to OMT, PT., HEP, and vitamins mostly, occasional NSAIDs  RTC in 4-6 weeks

## 2019-06-10 NOTE — Progress Notes (Signed)
[name] [place], [place] [address] Phone: [phone] Subjective:   I Kendra [name] am serving as a Education administrator for Dr. Hulan Saas.  This visit occurred during the SARS-CoV-2 public health emergency.  Safety protocols were in place, including screening questions prior to the visit, additional usage of staff PPE, and extensive cleaning of exam room while observing appropriate contact time as indicated for disinfecting solutions.     CC: Low back pain and buttocks pain follow-up  [id number]  Kendra [name] is a 48 y.o. female coming in with complaint of back pain. Patient states she is doing well. Hamstrings are doing well. Having left sided rib/side pain. Was making her bed when she felt the rib pop/pain. Pain feels like a spasm. Believes she has a rib out of place. Can't twist the the left side without pain. States she also worked out last night.       Past Medical History:  Diagnosis Date  . Allergy   . Breast cancer ([place]) left   2004 with chemo tx  . Osteoporosis   . Personal history of chemotherapy    Past Surgical History:  Procedure Laterality Date  . BREAST SURGERY     implant left in 2004, and biopsy  . MASTECTOMY Left 01/07/03   with tram flap and right breast lift in 2008  . TOTAL ABDOMINAL HYSTERECTOMY     related to breast cancer  . TOTAL ABDOMINAL HYSTERECTOMY W/ BILATERAL SALPINGOOPHORECTOMY  10/2003   Social History   Socioeconomic History  . Marital status: Married    Spouse name: Folashade Wincek  . Number of children: 2  . Years of education: Not on file  . Highest education level: Not on file  Occupational History  . Not on file  Tobacco Use  . Smoking status: Never Smoker  . Smokeless tobacco: Never Used  Substance and Sexual Activity  . Alcohol use: No  . Drug use: No  . Sexual activity: Yes    Birth control/protection: Other-see comments    Comment: had total hysterectomy  Other Topics Concern  .  Not on file  Social History Narrative  . Not on file   Social Determinants of Health   Financial Resource Strain:   . Difficulty of Paying Living Expenses: Not on file  Food Insecurity:   . Worried About Charity fundraiser in the Last Year: Not on file  . Ran Out of Food in the Last Year: Not on file  Transportation Needs:   . Lack of Transportation (Medical): Not on file  . Lack of Transportation (Non-Medical): Not on file  Physical Activity:   . Days of Exercise per Week: Not on file  . Minutes of Exercise per Session: Not on file  Stress:   . Feeling of Stress : Not on file  Social Connections:   . Frequency of Communication with Friends and Family: Not on file  . Frequency of Social Gatherings with Friends and Family: Not on file  . Attends Religious Services: Not on file  . Active Member of Clubs or Organizations: Not on file  . Attends Archivist Meetings: Not on file  . Marital Status: Not on file   No Known Allergies Family History  Problem Relation Age of Onset  . Arthritis Mother   . Hypothyroidism Mother   . Breast cancer Mother 19  . Alcohol abuse Brother      Current Outpatient Medications (Cardiovascular):  .  nitroGLYCERIN (NITRODUR - DOSED IN MG/24 HR) 0.2 mg/hr patch, 1/4 patch daily  Current Outpatient Medications (Respiratory):  .  fluticasone (FLONASE) 50 MCG/ACT nasal spray, PLACE 1 SPRAY INTO BOTH NOSTRILS DAILY. Marland Kitchen  loratadine-pseudoephedrine (CLARITIN-D 24 HOUR) 10-240 MG 24 hr tablet, Take 1 tablet by mouth daily.    Current Outpatient Medications (Other):  Marland Kitchen  Calcium-Vitamin D 600-200 MG-UNIT per tablet, Take 1 tablet by mouth daily.  .  cyclobenzaprine (FLEXERIL) 10 MG tablet, Take 1 tablet (10 mg total) by mouth 3 (three) times daily as needed for muscle spasms. .  hydrocortisone (ANUSOL-HC) 2.5 % rectal cream, Place 1 application rectally 2 (two) times daily. .  valACYclovir (VALTREX) 1000 MG tablet, Take 2 tablets (2,000 mg  total) by mouth 2 (two) times daily. At onset of cold sore .  VITAMIN D, CHOLECALCIFEROL, PO, Take 1 capsule by mouth daily. .  Vitamin D, Ergocalciferol, (DRISDOL) 50000 units CAPS capsule, Take 1 capsule (50,000 Units total) by mouth every 7 (seven) days.    Past medical history, social, surgical and family history all reviewed in electronic medical record.  No pertanent information unless stated regarding to the chief complaint.   Review of Systems:  No headache, visual changes, nausea, vomiting, diarrhea, constipation, dizziness, abdominal pain, skin rash, fevers, chills, night sweats, weight loss, swollen lymph nodes, body aches, joint swelling, muscle aches, chest pain, shortness of breath, mood changes.   Objective  Blood pressure 100/60, pulse 75, height 5\' 8"  (1.727 m), weight 151 lb (68.5 kg), SpO2 98 %.    General: No apparent distress alert and oriented x3 mood and affect normal, dressed appropriately.  HEENT: Pupils equal, extraocular movements intact  Respiratory: Patient's speak in full sentences and does not appear short of breath  Cardiovascular: No lower extremity edema, non tender, no erythema  Skin: Warm dry intact with no signs of infection or rash on extremities or on axial skeleton.  Abdomen: Soft nontender  Neuro: Cranial nerves II through XII are intact, neurovascularly intact in all extremities with 2+ DTRs and 2+ pulses.  Lymph: No lymphadenopathy of posterior or anterior cervical chain or axillae bilaterally.  Gait normal with good balance and coordination.  MSK:  Non tender with full range of motion and good stability and symmetric strength and tone of shoulders, elbows, wrist, hip, knee and ankles bilaterally.    Back - Normal skin, Spine with normal alignment and no deformity.  No tenderness to vertebral process palpation.  Paraspinous muscles are not tender and without spasm.   Range of motion is full at neck and lumbar sacral regions  Osteopathic  findings  C2 flexed rotated and side bent right C6 flexed rotated and side bent left T3 extended rotated and side bent right inhaled third rib T9 extended rotated and side bent left L2 flexed rotated and side bent right L4 flexed rotated and side bent left Sacrum right on right    Impression and Recommendations:     This case required medical decision making of moderate complexity. The above documentation has been reviewed and is accurate and complete Lyndal Pulley, DO       Note: This dictation was prepared with Dragon dictation along with smaller phrase technology. Any transcriptional errors that result from this process are unintentional.

## 2019-06-10 NOTE — Patient Instructions (Signed)
Good to see you Happy new year! Focus on strength  See me again in 4-6 weeks

## 2019-06-18 DIAGNOSIS — L814 Other melanin hyperpigmentation: Secondary | ICD-10-CM | POA: Diagnosis not present

## 2019-06-18 DIAGNOSIS — D2261 Melanocytic nevi of right upper limb, including shoulder: Secondary | ICD-10-CM | POA: Diagnosis not present

## 2019-06-18 DIAGNOSIS — X32XXXA Exposure to sunlight, initial encounter: Secondary | ICD-10-CM | POA: Diagnosis not present

## 2019-06-18 DIAGNOSIS — D225 Melanocytic nevi of trunk: Secondary | ICD-10-CM | POA: Diagnosis not present

## 2019-06-18 DIAGNOSIS — D2262 Melanocytic nevi of left upper limb, including shoulder: Secondary | ICD-10-CM | POA: Diagnosis not present

## 2019-06-18 DIAGNOSIS — D2271 Melanocytic nevi of right lower limb, including hip: Secondary | ICD-10-CM | POA: Diagnosis not present

## 2019-06-18 DIAGNOSIS — L821 Other seborrheic keratosis: Secondary | ICD-10-CM | POA: Diagnosis not present

## 2019-06-18 DIAGNOSIS — D2272 Melanocytic nevi of left lower limb, including hip: Secondary | ICD-10-CM | POA: Diagnosis not present

## 2019-06-26 ENCOUNTER — Encounter: Payer: Self-pay | Admitting: Family Medicine

## 2019-07-17 ENCOUNTER — Encounter: Payer: Self-pay | Admitting: Family Medicine

## 2019-07-17 ENCOUNTER — Other Ambulatory Visit: Payer: Self-pay

## 2019-07-17 ENCOUNTER — Ambulatory Visit (INDEPENDENT_AMBULATORY_CARE_PROVIDER_SITE_OTHER): Payer: 59 | Admitting: Family Medicine

## 2019-07-17 VITALS — BP 110/84 | HR 66 | Ht 68.0 in | Wt 153.0 lb

## 2019-07-17 DIAGNOSIS — M999 Biomechanical lesion, unspecified: Secondary | ICD-10-CM

## 2019-07-17 DIAGNOSIS — M533 Sacrococcygeal disorders, not elsewhere classified: Secondary | ICD-10-CM

## 2019-07-17 NOTE — Assessment & Plan Note (Addendum)
More of a chronic problem with an exacerbation.  Mild more tightness of the sacroiliac joint than previous exam.  Discussed icing regimen and home exercise, which activities to do which wants to avoid.  Discussed core strengthening hip abductor strengthening, ergonomics throughout the day.  Discussed patient social determinants that could potentially limit patient's treatment secondary to working more frequently at the moment during the pandemic.  Follow-up with me again in 6 weeks prescription drug management including continuing the vitamin D supplementation

## 2019-07-17 NOTE — Assessment & Plan Note (Signed)
Decision today to treat with OMT was based on Physical Exam  After verbal consent patient was treated with HVLA, ME, FPR techniques in cervical, thoracic, rib,  lumbar and sacral areas  Patient tolerated the procedure well with improvement in symptoms  Patient given exercises, stretches and lifestyle modifications  See medications in patient instructions if given  Patient will follow up in 4-8 weeks 

## 2019-07-17 NOTE — Patient Instructions (Signed)
Good to see you  Kendra [name] is your friend.  Stay active  See me again in 6 weeks

## 2019-07-17 NOTE — Progress Notes (Signed)
[facility] [place] Phone: [phone] Subjective:   Kendra [name], am serving as a scribe for Dr. Hulan Saas. This visit occurred during the SARS-CoV-2 public health emergency.  Safety protocols were in place, including screening questions prior to the visit, additional usage of staff PPE, and extensive cleaning of exam room while observing appropriate contact time as indicated for disinfecting solutions.   I'm seeing this patient by the request  of:  Mar Daring, PA-C  CC: Low back pain follow-up  [id number]  Kendra [name] is a 49 y.o. female coming in with complaint of back pain. Last seen on 06-10-2019 for OMT. Patient states that she is having some tightness. Had a deep pop which alleviated most of her pain last week. Isn't stretching as much so her leg feels better. Did do a lot of lunges on Monday.  Overall doing relatively well.  He states that when she does excessive stretching it seems to be worse with movement.     Past Medical History:  Diagnosis Date  . Allergy   . Breast cancer ([place]) left   2004 with chemo tx  . Osteoporosis   . Personal history of chemotherapy    Past Surgical History:  Procedure Laterality Date  . BREAST SURGERY     implant left in 2004, and biopsy  . MASTECTOMY Left 01/07/03   with tram flap and right breast lift in 2008  . TOTAL ABDOMINAL HYSTERECTOMY     related to breast cancer  . TOTAL ABDOMINAL HYSTERECTOMY W/ BILATERAL SALPINGOOPHORECTOMY  10/2003   Social History   Socioeconomic History  . Marital status: Married    Spouse name: Kamrin Allendorf  . Number of children: 2  . Years of education: Not on file  . Highest education level: Not on file  Occupational History  . Not on file  Tobacco Use  . Smoking status: Never Smoker  . Smokeless tobacco: Never Used  Substance and Sexual Activity  . Alcohol use: [name]  . Drug use: [name]  . Sexual activity: Yes    Birth  control/protection: Other-see comments    Comment: had total hysterectomy  Other Topics Concern  . Not on file  Social History Narrative  . Not on file   Social Determinants of Health   Financial Resource Strain:   . Difficulty of Paying Living Expenses: Not on file  Food Insecurity:   . Worried About Charity fundraiser in the Last Year: Not on file  . Ran Out of Food in the Last Year: Not on file  Transportation Needs:   . Lack of Transportation (Medical): Not on file  . Lack of Transportation (Non-Medical): Not on file  Physical Activity:   . Days of Exercise per Week: Not on file  . Minutes of Exercise per Session: Not on file  Stress:   . Feeling of Stress : Not on file  Social Connections:   . Frequency of Communication with Friends and Family: Not on file  . Frequency of Social Gatherings with Friends and Family: Not on file  . Attends Religious Services: Not on file  . Active Member of Clubs or Organizations: Not on file  . Attends Archivist Meetings: Not on file  . Marital Status: Not on file   [name] Known Allergies Family History  Problem Relation Age of Onset  . Arthritis Mother   . Hypothyroidism Mother   . Breast cancer Mother 25  .  Alcohol abuse Brother      Current Outpatient Medications (Cardiovascular):  .  nitroGLYCERIN (NITRODUR - DOSED IN MG/24 HR) 0.2 mg/hr patch, 1/4 patch daily  Current Outpatient Medications (Respiratory):  .  fluticasone (FLONASE) 50 MCG/ACT nasal spray, PLACE 1 SPRAY INTO BOTH NOSTRILS DAILY. Marland Kitchen  loratadine-pseudoephedrine (CLARITIN-D 24 HOUR) 10-240 MG 24 hr tablet, Take 1 tablet by mouth daily.    Current Outpatient Medications (Other):  Marland Kitchen  Calcium-Vitamin D 600-200 MG-UNIT per tablet, Take 1 tablet by mouth daily.  .  cyclobenzaprine (FLEXERIL) 10 MG tablet, Take 1 tablet (10 mg total) by mouth 3 (three) times daily as needed for muscle spasms. .  valACYclovir (VALTREX) 1000 MG tablet, Take 2 tablets (2,000 mg  total) by mouth 2 (two) times daily. At onset of cold sore .  VITAMIN D, CHOLECALCIFEROL, PO, Take 1 capsule by mouth daily. .  Vitamin D, Ergocalciferol, (DRISDOL) 50000 units CAPS capsule, Take 1 capsule (50,000 Units total) by mouth every 7 (seven) days. .  hydrocortisone (ANUSOL-HC) 2.5 % rectal cream, Place 1 application rectally 2 (two) times daily.   Reviewed prior external information including notes and imaging from  primary care provider As well as notes that were available from care everywhere and other healthcare systems.  Past medical history, social, surgical and family history all reviewed in electronic medical record.  [name] pertanent information unless stated regarding to the chief complaint.   Review of Systems:  [name] headache, visual changes, nausea, vomiting, diarrhea, constipation, dizziness, abdominal pain, skin rash, fevers, chills, night sweats, weight loss, swollen lymph nodes, body aches, joint swelling, chest pain, shortness of breath, mood changes. POSITIVE muscle aches  Objective  Blood pressure 110/84, pulse 66, height 5\' 8"  (1.727 m), weight 153 lb (69.4 kg), SpO2 98 %.   General: [name] apparent distress alert and oriented x3 mood and affect normal, dressed appropriately.  HEENT: Pupils equal, extraocular movements intact  Respiratory: Patient's speak in full sentences and does not appear short of breath  Cardiovascular: [name] lower extremity edema, non tender, [name] erythema  Skin: Warm dry intact with [name] signs of infection or rash on extremities or on axial skeleton.  Abdomen: Soft nontender  Neuro: Cranial nerves II through XII are intact, neurovascularly intact in all extremities with 2+ DTRs and 2+ pulses.  Lymph: [name] lymphadenopathy of posterior or anterior cervical chain or axillae bilaterally.  Gait normal with good balance and coordination.  MSK:  Non tender with full range of motion and good stability and symmetric strength and tone of shoulders, elbows, wrist,  hip, knee and ankles bilaterally.  Low back exam has some mild loss of lordosis.  Patient does have some tenderness to palpation in the paraspinal musculature lumbar spine right greater than left.  Patient does have a positive Corky Sox on the right side.  Tenderness over the sacroiliac joint.  Hamstrings seem to be less tight than previous.  Osteopathic findings  C4 flexed rotated and side bent left C6 flexed rotated and side bent left T3 extended rotated and side bent right inhaled third rib T9 extended rotated and side bent left L4 flexed rotated and side bent left Sacrum right on right    Impression and Recommendations:     This case required medical decision making of moderate complexity. The above documentation has been reviewed and is accurate and complete Lyndal Pulley, DO       Note: This dictation was prepared with Dragon dictation along with smaller phrase technology. Any transcriptional errors  that result from this process are unintentional.
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
## 2019-08-28 ENCOUNTER — Encounter: Payer: Self-pay | Admitting: Physician Assistant

## 2019-08-28 ENCOUNTER — Other Ambulatory Visit: Payer: Self-pay | Admitting: Physician Assistant

## 2019-08-28 ENCOUNTER — Other Ambulatory Visit: Payer: Self-pay

## 2019-08-28 ENCOUNTER — Ambulatory Visit (INDEPENDENT_AMBULATORY_CARE_PROVIDER_SITE_OTHER): Payer: 59 | Admitting: Physician Assistant

## 2019-08-28 VITALS — BP 101/69 | HR 61 | Temp 97.0°F | Resp 16 | Ht 67.5 in | Wt 148.4 lb

## 2019-08-28 DIAGNOSIS — Z853 Personal history of malignant neoplasm of breast: Secondary | ICD-10-CM

## 2019-08-28 DIAGNOSIS — Z1239 Encounter for other screening for malignant neoplasm of breast: Secondary | ICD-10-CM

## 2019-08-28 DIAGNOSIS — Z114 Encounter for screening for human immunodeficiency virus [HIV]: Secondary | ICD-10-CM | POA: Diagnosis not present

## 2019-08-28 DIAGNOSIS — Z0000 Encounter for general adult medical examination without abnormal findings: Secondary | ICD-10-CM | POA: Diagnosis not present

## 2019-08-28 NOTE — Patient Instructions (Signed)
Health Maintenance, Female Adopting a healthy lifestyle and getting preventive care are important in promoting health and wellness. Ask your health care provider about:  The right schedule for you to have regular tests and exams.  Things you can do on your own to prevent diseases and keep yourself healthy. What should I know about diet, weight, and exercise? Eat a healthy diet   Eat a diet that includes plenty of vegetables, fruits, low-fat dairy products, and lean protein.  Do not eat a lot of foods that are high in solid fats, added sugars, or sodium. Maintain a healthy weight Body mass index (BMI) is used to identify weight problems. It estimates body fat based on height and weight. Your health care provider can help determine your BMI and help you achieve or maintain a healthy weight. Get regular exercise Get regular exercise. This is one of the most important things you can do for your health. Most adults should:  Exercise for at least 150 minutes each week. The exercise should increase your heart rate and make you sweat (moderate-intensity exercise).  Do strengthening exercises at least twice a week. This is in addition to the moderate-intensity exercise.  Spend less time sitting. Even light physical activity can be beneficial. Watch cholesterol and blood lipids Have your blood tested for lipids and cholesterol at 50 years of age, then have this test every 5 years. Have your cholesterol levels checked more often if:  Your lipid or cholesterol levels are high.  You are older than 50 years of age.  You are at high risk for heart disease. What should I know about cancer screening? Depending on your health history and family history, you may need to have cancer screening at various ages. This may include screening for:  Breast cancer.  Cervical cancer.  Colorectal cancer.  Skin cancer.  Lung cancer. What should I know about heart disease, diabetes, and high blood  pressure? Blood pressure and heart disease  High blood pressure causes heart disease and increases the risk of stroke. This is more likely to develop in people who have high blood pressure readings, are of African descent, or are overweight.  Have your blood pressure checked: ? Every 3-5 years if you are 18-39 years of age. ? Every year if you are 40 years old or older. Diabetes Have regular diabetes screenings. This checks your fasting blood sugar level. Have the screening done:  Once every three years after age 40 if you are at a normal weight and have a low risk for diabetes.  More often and at a younger age if you are overweight or have a high risk for diabetes. What should I know about preventing infection? Hepatitis B If you have a higher risk for hepatitis B, you should be screened for this virus. Talk with your health care provider to find out if you are at risk for hepatitis B infection. Hepatitis C Testing is recommended for:  Everyone born from 1945 through 1965.  Anyone with known risk factors for hepatitis C. Sexually transmitted infections (STIs)  Get screened for STIs, including gonorrhea and chlamydia, if: ? You are sexually active and are younger than 50 years of age. ? You are older than 50 years of age and your health care provider tells you that you are at risk for this type of infection. ? Your sexual activity has changed since you were last screened, and you are at increased risk for chlamydia or gonorrhea. Ask your health care provider if   you are at risk.  Ask your health care provider about whether you are at high risk for HIV. Your health care provider may recommend a prescription medicine to help prevent HIV infection. If you choose to take medicine to prevent HIV, you should first get tested for HIV. You should then be tested every 3 months for as long as you are taking the medicine. Pregnancy  If you are about to stop having your period (premenopausal) and  you may become pregnant, seek counseling before you get pregnant.  Take 400 to 800 micrograms (mcg) of folic acid every day if you become pregnant.  Ask for birth control (contraception) if you want to prevent pregnancy. Osteoporosis and menopause Osteoporosis is a disease in which the bones lose minerals and strength with aging. This can result in bone fractures. If you are 65 years old or older, or if you are at risk for osteoporosis and fractures, ask your health care provider if you should:  Be screened for bone loss.  Take a calcium or vitamin D supplement to lower your risk of fractures.  Be given hormone replacement therapy (HRT) to treat symptoms of menopause. Follow these instructions at home: Lifestyle  Do not use any products that contain nicotine or tobacco, such as cigarettes, e-cigarettes, and chewing tobacco. If you need help quitting, ask your health care provider.  Do not use street drugs.  Do not share needles.  Ask your health care provider for help if you need support or information about quitting drugs. Alcohol use  Do not drink alcohol if: ? Your health care provider tells you not to drink. ? You are pregnant, may be pregnant, or are planning to become pregnant.  If you drink alcohol: ? Limit how much you use to 0-1 drink a day. ? Limit intake if you are breastfeeding.  Be aware of how much alcohol is in your drink. In the [place] one drink equals one 12 oz bottle of beer (355 mL), one 5 oz glass of wine (148 mL), or one 1 oz glass of hard liquor (44 mL). General instructions  Schedule regular health, dental, and eye exams.  Stay current with your vaccines.  Tell your health care provider if: ? You often feel depressed. ? You have ever been abused or do not feel safe at home. Summary  Adopting a healthy lifestyle and getting preventive care are important in promoting health and wellness.  Follow your health care provider's instructions about healthy  diet, exercising, and getting tested or screened for diseases.  Follow your health care provider's instructions on monitoring your cholesterol and blood pressure. This information is not intended to replace advice given to you by your health care provider. Make sure you discuss any questions you have with your health care provider. Document Revised: 05/22/2018 Document Reviewed: 05/22/2018 Elsevier Patient Education  2020 Elsevier Inc.  

## 2019-08-29 ENCOUNTER — Ambulatory Visit (INDEPENDENT_AMBULATORY_CARE_PROVIDER_SITE_OTHER): Payer: 59 | Admitting: Family Medicine

## 2019-08-29 ENCOUNTER — Encounter: Payer: Self-pay | Admitting: Family Medicine

## 2019-08-29 VITALS — BP 110/70 | HR 79 | Ht 67.5 in | Wt 152.0 lb

## 2019-08-29 DIAGNOSIS — M999 Biomechanical lesion, unspecified: Secondary | ICD-10-CM | POA: Diagnosis not present

## 2019-08-29 DIAGNOSIS — M533 Sacrococcygeal disorders, not elsewhere classified: Secondary | ICD-10-CM

## 2019-08-29 LAB — CBC WITH DIFFERENTIAL/PLATELET
Basophils Absolute: 0.1 10*3/uL (ref 0.0–0.2)
Basos: 1 %
EOS (ABSOLUTE): 0.1 10*3/uL (ref 0.0–0.4)
Eos: 1 %
Hematocrit: 38.6 % (ref 34.0–46.6)
Hemoglobin: 12.6 g/dL (ref 11.1–15.9)
Immature Grans (Abs): 0 10*3/uL (ref 0.0–0.1)
Immature Granulocytes: 0 %
Lymphocytes Absolute: 1.4 10*3/uL (ref 0.7–3.1)
Lymphs: 31 %
MCH: 29.9 pg (ref 26.6–33.0)
MCHC: 32.6 g/dL (ref 31.5–35.7)
MCV: 92 fL (ref 79–97)
Monocytes Absolute: 0.3 10*3/uL (ref 0.1–0.9)
Monocytes: 8 %
Neutrophils Absolute: 2.6 10*3/uL (ref 1.4–7.0)
Neutrophils: 59 %
Platelets: 172 10*3/uL (ref 150–450)
RBC: 4.21 x10E6/uL (ref 3.77–5.28)
RDW: 11.9 % (ref 11.7–15.4)
WBC: 4.4 10*3/uL (ref 3.4–10.8)

## 2019-08-29 LAB — COMPREHENSIVE METABOLIC PANEL
ALT: 12 IU/L (ref 0–32)
AST: 17 IU/L (ref 0–40)
Albumin/Globulin Ratio: 1.8 (ref 1.2–2.2)
Albumin: 4.3 g/dL (ref 3.8–4.8)
Alkaline Phosphatase: 60 IU/L (ref 39–117)
BUN/Creatinine Ratio: 20 (ref 9–23)
BUN: 18 mg/dL (ref 6–24)
Bilirubin Total: 0.7 mg/dL (ref 0.0–1.2)
CO2: 24 mmol/L (ref 20–29)
Calcium: 9.6 mg/dL (ref 8.7–10.2)
Chloride: 103 mmol/L (ref 96–106)
Creatinine, Ser: 0.88 mg/dL (ref 0.57–1.00)
GFR calc Af Amer: 89 mL/min/{1.73_m2} (ref >59)
GFR calc non Af Amer: 77 mL/min/{1.73_m2} (ref >59)
Globulin, Total: 2.4 g/dL (ref 1.5–4.5)
Glucose: 83 mg/dL (ref 65–99)
Potassium: 4.1 mmol/L (ref 3.5–5.2)
Sodium: 142 mmol/L (ref 134–144)
Total Protein: 6.7 g/dL (ref 6.0–8.5)

## 2019-08-29 LAB — LIPID PANEL
Chol/HDL Ratio: 3.2 ratio (ref 0.0–4.4)
Cholesterol, Total: 191 mg/dL (ref 100–199)
HDL: 60 mg/dL (ref >39)
LDL Chol Calc (NIH): 117 mg/dL — ABNORMAL HIGH (ref 0–99)
Triglycerides: 74 mg/dL (ref 0–149)
VLDL Cholesterol Cal: 14 mg/dL (ref 5–40)

## 2019-08-29 LAB — HEMOGLOBIN A1C
Est. average glucose Bld gHb Est-mCnc: 103 mg/dL
Hgb A1c MFr Bld: 5.2 % (ref 4.8–5.6)

## 2019-08-29 LAB — CANCER ANTIGEN 27.29: CA 27.29: 11.5 U/mL (ref 0.0–38.6)

## 2019-08-29 LAB — TSH: TSH: 2.47 u[IU]/mL (ref 0.450–4.500)

## 2019-08-29 LAB — HIV ANTIBODY (ROUTINE TESTING W REFLEX): HIV Screen 4th Generation wRfx: NONREACTIVE

## 2019-08-29 NOTE — Assessment & Plan Note (Signed)
Decision today to treat with OMT was based on Physical Exam  After verbal consent patient was treated with HVLA, ME, FPR techniques in cervical, thoracic, rib,  lumbar and sacral areas  Patient tolerated the procedure well with improvement in symptoms  Patient given exercises, stretches and lifestyle modifications  See medications in patient instructions if given  Patient will follow up in 4-8 weeks 

## 2019-08-29 NOTE — Assessment & Plan Note (Signed)
Chronic problem with mild exacerbation.  Has responded fairly well to osteopathic medical.  We will continue with the once weekly vitamin D.  Spent greater than 31 minutes with patient discussing ailments, while manipulation works, as well as discussing some of her children potentially any type of progression.  Follow-up again in 5 to 6 weeks

## 2019-08-29 NOTE — Patient Instructions (Signed)
We are here for any questions See me in 5 weeks

## 2019-08-29 NOTE — Progress Notes (Signed)
[facility] Phone: [phone] Subjective:   [name], am serving as a scribe for Dr. Hulan Saas. This visit occurred during the SARS-CoV-2 public health emergency.  Safety protocols were in place, including screening questions prior to the visit, additional usage of staff PPE, and extensive cleaning of exam room while observing appropriate contact time as indicated for disinfecting solutions.   I'm seeing this patient by the request  of:  Mar Daring, PA-C  CC: Low back pain follow-up  [id number]  Kendra [name] is a 49 y.o. female coming in with complaint of back pain. Last seen on 07/17/2019 for OMT. Is having tightness in her back. Has sat more this week thus having hamstring pain. Neck is tight today due to being nervous about the storm.       Past Medical History:  Diagnosis Date  . Allergy   . Breast cancer ([place]) left   2004 with chemo tx  . Osteoporosis   . Personal history of chemotherapy    Past Surgical History:  Procedure Laterality Date  . BREAST SURGERY     implant left in 2004, and biopsy  . MASTECTOMY Left 01/07/03   with tram flap and right breast lift in 2008  . TOTAL ABDOMINAL HYSTERECTOMY     related to breast cancer  . TOTAL ABDOMINAL HYSTERECTOMY W/ BILATERAL SALPINGOOPHORECTOMY  10/2003   Social History   Socioeconomic History  . Marital status: Married    Spouse name: Donnita Cardinale  . Number of children: 2  . Years of education: Not on file  . Highest education level: Not on file  Occupational History  . Not on file  Tobacco Use  . Smoking status: Never Smoker  . Smokeless tobacco: Never Used  Substance and Sexual Activity  . Alcohol use: No  . Drug use: No  . Sexual activity: Yes    Birth control/protection: Other-see comments    Comment: had total hysterectomy  Other Topics Concern  . Not on file  Social History Narrative  . Not on file   Social  Determinants of Health   Financial Resource Strain:   . Difficulty of Paying Living Expenses:   Food Insecurity:   . Worried About Charity fundraiser in the Last Year:   . Arboriculturist in the Last Year:   Transportation Needs:   . Film/video editor (Medical):   Marland Kitchen Lack of Transportation (Non-Medical):   Physical Activity:   . Days of Exercise per Week:   . Minutes of Exercise per Session:   Stress:   . Feeling of Stress :   Social Connections:   . Frequency of Communication with Friends and Family:   . Frequency of Social Gatherings with Friends and Family:   . Attends Religious Services:   . Active Member of Clubs or Organizations:   . Attends Archivist Meetings:   Marland Kitchen Marital Status:    No Known Allergies Family History  Problem Relation Age of Onset  . Arthritis Mother   . Hypothyroidism Mother   . Breast cancer Mother 40  . Alcohol abuse Brother      Current Outpatient Medications (Cardiovascular):  .  nitroGLYCERIN (NITRODUR - DOSED IN MG/24 HR) 0.2 mg/hr patch, 1/4 patch daily  Current Outpatient Medications (Respiratory):  .  fluticasone (FLONASE) 50 MCG/ACT nasal spray, PLACE 1 SPRAY INTO BOTH NOSTRILS DAILY. Marland Kitchen  loratadine-pseudoephedrine (CLARITIN-D 24 HOUR) 10-240  MG 24 hr tablet, Take 1 tablet by mouth daily.    Current Outpatient Medications (Other):  Marland Kitchen  Calcium-Vitamin D 600-200 MG-UNIT per tablet, Take 1 tablet by mouth daily.  .  cyclobenzaprine (FLEXERIL) 10 MG tablet, Take 1 tablet (10 mg total) by mouth 3 (three) times daily as needed for muscle spasms. .  valACYclovir (VALTREX) 1000 MG tablet, Take 2 tablets (2,000 mg total) by mouth 2 (two) times daily. At onset of cold sore .  VITAMIN D, CHOLECALCIFEROL, PO, Take 1 capsule by mouth daily. .  Vitamin D, Ergocalciferol, (DRISDOL) 50000 units CAPS capsule, Take 1 capsule (50,000 Units total) by mouth every 7 (seven) days.   Reviewed prior external information including notes and  imaging from  primary care provider As well as notes that were available from care everywhere and other healthcare systems.  Past medical history, social, surgical and family history all reviewed in electronic medical record.  No pertanent information unless stated regarding to the chief complaint.   Review of Systems:  No headache, visual changes, nausea, vomiting, diarrhea, constipation, dizziness, abdominal pain, skin rash, fevers, chills, night sweats, weight loss, swollen lymph nodes, body aches, joint swelling, chest pain, shortness of breath, mood changes. POSITIVE muscle aches  Objective  Blood pressure 110/70, pulse 79, height 5' 7.5" (1.715 m), weight 152 lb (68.9 kg), SpO2 98 %.   General: No apparent distress alert and oriented x3 mood and affect normal, dressed appropriately.  HEENT: Pupils equal, extraocular movements intact  Respiratory: Patient's speak in full sentences and does not appear short of breath  Cardiovascular: No lower extremity edema, non tender, no erythema  Skin: Warm dry intact with no signs of infection or rash on extremities or on axial skeleton.  Abdomen: Soft nontender  Neuro: Cranial nerves II through XII are intact, neurovascularly intact in all extremities with 2+ DTRs and 2+ pulses.  Lymph: No lymphadenopathy of posterior or anterior cervical chain or axillae bilaterally.  Gait normal with good balance and coordination.  MSK:  tender with full range of motion and good stability and symmetric strength and tone of shoulders, elbows, wrist, hip, knee and ankles bilaterally.  Low back exam does have some mild loss of lordosis.  Some tightness noted in the paraspinal musculature in the lumbar spine on the right side.  Patient does also have some mild increase in tightness in her neck.  Negative Spurling's.  5-5 strength of all the extremities.  Osteopathic findings C2 flexed rotated and side bent right C6 flexed rotated and side bent left T3 extended  rotated and side bent right inhaled third rib T5 extended rotated and side bent left L2 flexed rotated and side bent right Sacrum right on right    Impression and Recommendations:     This case required medical decision making of moderate complexity. The above documentation has been reviewed and is accurate and complete Kendra Pulley, DO       Note: This dictation was prepared with Dragon dictation along with smaller phrase technology. Any transcriptional errors that result from this process are unintentional.

## 2019-10-07 ENCOUNTER — Ambulatory Visit (INDEPENDENT_AMBULATORY_CARE_PROVIDER_SITE_OTHER): Payer: 59 | Admitting: Family Medicine

## 2019-10-07 ENCOUNTER — Other Ambulatory Visit: Payer: Self-pay

## 2019-10-07 ENCOUNTER — Encounter: Payer: Self-pay | Admitting: Family Medicine

## 2019-10-07 VITALS — BP 112/64 | HR 78 | Ht 67.5 in | Wt 152.0 lb

## 2019-10-07 DIAGNOSIS — M999 Biomechanical lesion, unspecified: Secondary | ICD-10-CM

## 2019-10-07 DIAGNOSIS — M5416 Radiculopathy, lumbar region: Secondary | ICD-10-CM

## 2019-10-07 NOTE — Patient Instructions (Signed)
See me again in 5-6 weeks 

## 2019-10-07 NOTE — Progress Notes (Signed)
[place] Phone: [phone] Subjective:   Kendra [name], am serving as a scribe for Dr. Hulan Saas. This visit occurred during the SARS-CoV-2 public health emergency.  Safety protocols were in place, including screening questions prior to the visit, additional usage of staff PPE, and extensive cleaning of exam room while observing appropriate contact time as indicated for disinfecting solutions.   I'm seeing this patient by the request  of:  Kendra Daring, PA-C  CC: Back pain follow-up  [id number]  Kendra [name] is a 49 y.o. female coming in with complaint of back pain. Last seen on 08/29/2019. States that her right hip aches when she waits 5-6 weeks to come for manipulation. Has not been as active as usual. Having an increase in popping sensation. Pain with hip flexion especially with cycling and rowing.       Past Medical History:  Diagnosis Date  . Allergy   . Breast cancer ([place]) left   2004 with chemo tx  . Osteoporosis   . Personal history of chemotherapy    Past Surgical History:  Procedure Laterality Date  . BREAST SURGERY     implant left in 2004, and biopsy  . MASTECTOMY Left 01/07/03   with tram flap and right breast lift in 2008  . TOTAL ABDOMINAL HYSTERECTOMY     related to breast cancer  . TOTAL ABDOMINAL HYSTERECTOMY W/ BILATERAL SALPINGOOPHORECTOMY  10/2003   Social History   Socioeconomic History  . Marital status: Married    Spouse name: Kendra [name]  . Number of children: 2  . Years of education: Not on file  . Highest education level: Not on file  Occupational History  . Not on file  Tobacco Use  . Smoking status: Never Smoker  . Smokeless tobacco: Never Used  Substance and Sexual Activity  . Alcohol use: [name]  . Drug use: [name]  . Sexual activity: Yes    Birth control/protection: Other-see comments    Comment: had total hysterectomy  Other Topics Concern  . Not  on file  Social History Narrative  . Not on file   Social Determinants of Health   Financial Resource Strain:   . Difficulty of Paying Living Expenses:   Food Insecurity:   . Worried About Charity fundraiser in the Last Year:   . Arboriculturist in the Last Year:   Transportation Needs:   . Film/video editor (Medical):   Marland Kitchen Lack of Transportation (Non-Medical):   Physical Activity:   . Days of Exercise per Week:   . Minutes of Exercise per Session:   Stress:   . Feeling of Stress :   Social Connections:   . Frequency of Communication with Friends and Family:   . Frequency of Social Gatherings with Friends and Family:   . Attends Religious Services:   . Active Member of Clubs or Organizations:   . Attends Archivist Meetings:   Marland Kitchen Marital Status:    [name] Known Allergies Family History  Problem Relation Age of Onset  . Arthritis Mother   . Hypothyroidism Mother   . Breast cancer Mother 51  . Alcohol abuse Brother      Current Outpatient Medications (Cardiovascular):  .  nitroGLYCERIN (NITRODUR - DOSED IN MG/24 HR) 0.2 mg/hr patch, 1/4 patch daily  Current Outpatient Medications (Respiratory):  .  fluticasone (FLONASE) 50 MCG/ACT nasal spray, PLACE 1 SPRAY INTO BOTH NOSTRILS  DAILY. .  loratadine-pseudoephedrine (CLARITIN-D 24 HOUR) 10-240 MG 24 hr tablet, Take 1 tablet by mouth daily.    Current Outpatient Medications (Other):  Marland Kitchen  Calcium-Vitamin D 600-200 MG-UNIT per tablet, Take 1 tablet by mouth daily.  .  cyclobenzaprine (FLEXERIL) 10 MG tablet, Take 1 tablet (10 mg total) by mouth 3 (three) times daily as needed for muscle spasms. .  valACYclovir (VALTREX) 1000 MG tablet, Take 2 tablets (2,000 mg total) by mouth 2 (two) times daily. At onset of cold sore .  VITAMIN D, CHOLECALCIFEROL, PO, Take 1 capsule by mouth daily. .  Vitamin D, Ergocalciferol, (DRISDOL) 50000 units CAPS capsule, Take 1 capsule (50,000 Units total) by mouth every 7 (seven) days.    Reviewed prior external information including notes and imaging from  primary care provider As well as notes that were available from care everywhere and other healthcare systems.  Past medical history, social, surgical and family history all reviewed in electronic medical record.  [name] pertanent information unless stated regarding to the chief complaint.   Review of Systems:  [name] headache, visual changes, nausea, vomiting, diarrhea, constipation, dizziness, abdominal pain, skin rash, fevers, chills, night sweats, weight loss, swollen lymph nodes, body aches, joint swelling, chest pain, shortness of breath, mood changes. POSITIVE muscle aches  Objective  Blood pressure 112/64, pulse 78, height 5' 7.5" (1.715 m), weight 152 lb (68.9 kg), SpO2 97 %.   General: [name] apparent distress alert and oriented x3 mood and affect normal, dressed appropriately.  HEENT: Pupils equal, extraocular movements intact  Respiratory: Patient's speak in full sentences and does not appear short of breath  Cardiovascular: [name] lower extremity edema, non tender, [name] erythema  Neuro: Cranial nerves II through XII are intact, neurovascularly intact in all extremities with 2+ DTRs and 2+ pulses.  Gait normal with good balance and coordination.  MSK:  Non tender with full range of motion and good stability and symmetric strength and tone of shoulders, elbows, wrist, hip, knee and ankles bilaterally.  Low back exam does have some increasing tightness from previous exam.  Positive Corky Sox  Osteopathic findings  C6 flexed rotated and side bent left T3 extended rotated and side bent right inhaled third rib T9 extended rotated and side bent left L2 flexed rotated and side bent right Sacrum right on right    Impression and Recommendations:     This case required medical decision making of moderate complexity. The above documentation has been reviewed and is accurate and complete Lyndal Pulley, DO       Note: This  dictation was prepared with Dragon dictation along with smaller phrase technology. Any transcriptional errors that result from this process are unintentional.

## 2019-10-07 NOTE — Assessment & Plan Note (Signed)
Chronic problem with exacerbation.  Discussed medication management including the Flexeril.  Responding fairly well to the osteopathic manipulation no.  I do believe stress as well and encouraged her to monitor.  Follow-up again in 4 to 8 weeks

## 2019-10-07 NOTE — Assessment & Plan Note (Signed)

## 2019-11-07 ENCOUNTER — Other Ambulatory Visit: Payer: Self-pay

## 2019-11-07 ENCOUNTER — Ambulatory Visit (INDEPENDENT_AMBULATORY_CARE_PROVIDER_SITE_OTHER): Payer: 59 | Admitting: Family Medicine

## 2019-11-07 ENCOUNTER — Encounter: Payer: Self-pay | Admitting: Family Medicine

## 2019-11-07 VITALS — BP 102/72 | HR 79 | Ht 67.5 in | Wt 154.0 lb

## 2019-11-07 DIAGNOSIS — M533 Sacrococcygeal disorders, not elsewhere classified: Secondary | ICD-10-CM

## 2019-11-07 DIAGNOSIS — M999 Biomechanical lesion, unspecified: Secondary | ICD-10-CM | POA: Diagnosis not present

## 2019-11-07 NOTE — Patient Instructions (Signed)
Great to see you  Keep feet on ground with sitting COOP pillow  If cross legs then do right behind left  See me again in 5 weeks

## 2019-11-07 NOTE — Progress Notes (Signed)
[place] Phone: [phone] Subjective:   [name], am serving as a scribe for Dr. Hulan Saas. This visit occurred during the SARS-CoV-2 public health emergency.  Safety protocols were in place, including screening questions prior to the visit, additional usage of staff PPE, and extensive cleaning of exam room while observing appropriate contact time as indicated for disinfecting solutions.   I'm seeing this patient by the request  of:  Mar Daring, PA-C  CC: Back pain follow-up  [id number]  Kendra [name] is a 49 y.o. female coming in with complaint of back pain. Feels that she is tight and needing an adjustment.  Only on the right buttock pain.  States that it comes and goes.  Not affecting daily activities too much.  Patient does have to augment her work-up sometimes.      Past Medical History:  Diagnosis Date  . Allergy   . Breast cancer ([place]) left   2004 with chemo tx  . Osteoporosis   . Personal history of chemotherapy    Past Surgical History:  Procedure Laterality Date  . BREAST SURGERY     implant left in 2004, and biopsy  . MASTECTOMY Left 01/07/03   with tram flap and right breast lift in 2008  . TOTAL ABDOMINAL HYSTERECTOMY     related to breast cancer  . TOTAL ABDOMINAL HYSTERECTOMY W/ BILATERAL SALPINGOOPHORECTOMY  10/2003   Social History   Socioeconomic History  . Marital status: Married    Spouse name: Sally Decena  . Number of children: 2  . Years of education: Not on file  . Highest education level: Not on file  Occupational History  . Not on file  Tobacco Use  . Smoking status: Never Smoker  . Smokeless tobacco: Never Used  Substance and Sexual Activity  . Alcohol use: No  . Drug use: No  . Sexual activity: Yes    Birth control/protection: Other-see comments    Comment: had total hysterectomy  Other Topics Concern  . Not on file  Social History  Narrative  . Not on file   Social Determinants of Health   Financial Resource Strain:   . Difficulty of Paying Living Expenses:   Food Insecurity:   . Worried About Charity fundraiser in the Last Year:   . Arboriculturist in the Last Year:   Transportation Needs:   . Film/video editor (Medical):   Marland Kitchen Lack of Transportation (Non-Medical):   Physical Activity:   . Days of Exercise per Week:   . Minutes of Exercise per Session:   Stress:   . Feeling of Stress :   Social Connections:   . Frequency of Communication with Friends and Family:   . Frequency of Social Gatherings with Friends and Family:   . Attends Religious Services:   . Active Member of Clubs or Organizations:   . Attends Archivist Meetings:   Marland Kitchen Marital Status:    No Known Allergies Family History  Problem Relation Age of Onset  . Arthritis Mother   . Hypothyroidism Mother   . Breast cancer Mother 75  . Alcohol abuse Brother      Current Outpatient Medications (Cardiovascular):  .  nitroGLYCERIN (NITRODUR - DOSED IN MG/24 HR) 0.2 mg/hr patch, 1/4 patch daily  Current Outpatient Medications (Respiratory):  .  fluticasone (FLONASE) 50 MCG/ACT nasal spray, PLACE 1 SPRAY INTO BOTH NOSTRILS DAILY. Marland Kitchen  loratadine-pseudoephedrine (CLARITIN-D 24 HOUR) 10-240 MG 24 hr tablet, Take 1 tablet by mouth daily.    Current Outpatient Medications (Other):  Marland Kitchen  Calcium-Vitamin D 600-200 MG-UNIT per tablet, Take 1 tablet by mouth daily.  .  cyclobenzaprine (FLEXERIL) 10 MG tablet, Take 1 tablet (10 mg total) by mouth 3 (three) times daily as needed for muscle spasms. .  valACYclovir (VALTREX) 1000 MG tablet, Take 2 tablets (2,000 mg total) by mouth 2 (two) times daily. At onset of cold sore .  VITAMIN D, CHOLECALCIFEROL, PO, Take 1 capsule by mouth daily. .  Vitamin D, Ergocalciferol, (DRISDOL) 50000 units CAPS capsule, Take 1 capsule (50,000 Units total) by mouth every 7 (seven) days.   Reviewed prior  external information including notes and imaging from  primary care provider As well as notes that were available from care everywhere and other healthcare systems.  Past medical history, social, surgical and family history all reviewed in electronic medical record.  No pertanent information unless stated regarding to the chief complaint.   Review of Systems:  No headache, visual changes, nausea, vomiting, diarrhea, constipation, dizziness, abdominal pain, skin rash, fevers, chills, night sweats, weight loss, swollen lymph nodes, body aches, joint swelling, chest pain, shortness of breath, mood changes. POSITIVE muscle aches  Objective  Blood pressure 102/72, pulse 79, height 5' 7.5" (1.715 m), weight 154 lb (69.9 kg), SpO2 98 %.   General: No apparent distress alert and oriented x3 mood and affect normal, dressed appropriately.  HEENT: Pupils equal, extraocular movements intact  Respiratory: Patient's speak in full sentences and does not appear short of breath  Cardiovascular: No lower extremity edema, non tender, no erythema  Neuro: Cranial nerves II through XII are intact, neurovascularly intact in all extremities with 2+ DTRs and 2+ pulses.  Gait normal with good balance and coordination.  MSK:  tender with full range of motion and good stability and symmetric strength and tone of shoulders, elbows, wrist, hip, knee and ankles bilaterally.  On my exam does have some tender to palpation in the paraspinal musculature lumbar spine left greater than right.  Patient does also have more right gluteal tendon tightness.  Negative straight leg but positive Corky Sox.  Mild pain with extension of the back greater than 15 degrees  Osteopathic findings  C2 flexed rotated and side bent right C6 flexed rotated and side bent left T3 extended rotated and side bent right inhaled third rib T9 extended rotated and side bent left L2 flexed rotated and side bent right Sacrum right on right      Impression  and Recommendations:     This case required medical decision making of moderate complexity. The above documentation has been reviewed and is accurate and complete Lyndal Pulley, DO       Note: This dictation was prepared with Dragon dictation along with smaller phrase technology. Any transcriptional errors that result from this process are unintentional.

## 2019-11-07 NOTE — Assessment & Plan Note (Signed)

## 2019-11-07 NOTE — Assessment & Plan Note (Signed)
Chronic problem.  Discussed posture and ergonomics, which activities to do which wants to avoid.  Patient will increase activity slowly.  Discussed icing regimen and home exercises.  May need to consider the possibility of repeating gluteal tendon injection.  Follow-up again in 6 to 8 weeks

## 2019-12-11 ENCOUNTER — Encounter: Payer: Self-pay | Admitting: Family Medicine

## 2019-12-11 ENCOUNTER — Ambulatory Visit (INDEPENDENT_AMBULATORY_CARE_PROVIDER_SITE_OTHER): Payer: 59 | Admitting: Family Medicine

## 2019-12-11 ENCOUNTER — Other Ambulatory Visit: Payer: Self-pay

## 2019-12-11 VITALS — BP 104/68 | HR 69 | Ht 67.5 in | Wt 156.0 lb

## 2019-12-11 DIAGNOSIS — M7712 Lateral epicondylitis, left elbow: Secondary | ICD-10-CM

## 2019-12-11 DIAGNOSIS — M999 Biomechanical lesion, unspecified: Secondary | ICD-10-CM

## 2019-12-11 NOTE — Patient Instructions (Signed)
Thigh compression Keep feet planted or machines for 1 week Avoid overhand activity See me again in 6-8 weeks

## 2019-12-11 NOTE — Assessment & Plan Note (Signed)
Lateral epicondylitis. Discussed with patient in great length about icing regimen and home exercise, which activities to do which wants to avoid. Discussed avoiding overhead lifting. Worsening symptoms will consider ultrasound and potentially nitroglycerin at follow-up.

## 2019-12-11 NOTE — Progress Notes (Signed)
[place] Phone: [phone] Subjective:   [name], am serving as a scribe for Dr. Hulan Saas. This visit occurred during the SARS-CoV-2 public health emergency.  Safety protocols were in place, including screening questions prior to the visit, additional usage of staff PPE, and extensive cleaning of exam room while observing appropriate contact time as indicated for disinfecting solutions.   I'm seeing this patient by the request  of:  Mar Daring, PA-C  CC: Back pain follow-up, new elbow pain  [id number]  Kendra RENNAKER is a 49 y.o. female coming in with complaint of back and neck pain. OMT 5/28/201. Patient states that she is having lateral epicondyle pain for the past 3 weeks. Pain with elbow flexion.  Did a rugged maniac race on Saturday. Is sore in hips. Went to gym on Monday and did a step back with right leg and felt quad strain. Pain is improving. Has not worked out since then.  Medications patient has been prescribed: Vitamin D once weekly, Flexeril, no longer taking the nitroglycerin patch          Reviewed prior external information including notes and imaging from previsou exam, outside providers and external EMR if available.   As well as notes that were available from care everywhere and other healthcare systems.  Past medical history, social, surgical and family history all reviewed in electronic medical record.  No pertanent information unless stated regarding to the chief complaint.   Past Medical History:  Diagnosis Date  . Allergy   . Breast cancer ([place]) left   2004 with chemo tx  . Osteoporosis   . Personal history of chemotherapy     No Known Allergies   Review of Systems:  No headache, visual changes, nausea, vomiting, diarrhea, constipation, dizziness, abdominal pain, skin rash, fevers, chills, night sweats, weight loss, swollen lymph nodes, body aches,  joint swelling, chest pain, shortness of breath, mood changes. POSITIVE muscle aches  Objective  There were no vitals taken for this visit.   General: No apparent distress alert and oriented x3 mood and affect normal, dressed appropriately.  HEENT: Pupils equal, extraocular movements intact  Respiratory: Patient's speak in full sentences and does not appear short of breath  Cardiovascular: No lower extremity edema, non tender, no erythema  Neuro: Cranial nerves II through XII are intact, neurovascularly intact in all extremities with 2+ DTRs and 2+ pulses.  Gait normal with good balance and coordination.  MSK: Left elbow tender to palpation over the lateral epicondylar area that gets worse with any type of resisted wrist extension. Good grip strength noted. Back -back exam shows a mild increase in tightness of the lower back especially of the right sacroiliac joint. Negative straight leg test. Patient does have some pain with increased extension of the back.    Osteopathic findings  C2 flexed rotated and side bent right T3 extended rotated and side bent right inhaled rib T9 extended rotated and side bent left L4 flexed rotated and side bent right Sacrum right on right         Assessment and Plan:  Left lateral epicondylitis Lateral epicondylitis. Discussed with patient in great length about icing regimen and home exercise, which activities to do which wants to avoid. Discussed avoiding overhead lifting. Worsening symptoms will consider ultrasound and potentially nitroglycerin at follow-up.   Back exam does have some tightness in the paraspinal musculature lumbar spine right greater than left.  Negative Spurling's. Patient continues to have pain with extension no. Discussed with patient about the muscle relaxer on a regular basis at night.  Nonallopathic problems  Decision today to treat with OMT was based on Physical Exam  After verbal consent patient was treated with HVLA,  ME, FPR techniques in cervical, rib, thoracic, lumbar, and sacral  areas  Patient tolerated the procedure well with improvement in symptoms  Patient given exercises, stretches and lifestyle modifications  See medications in patient instructions if given  Patient will follow up in 4-8 weeks      The above documentation has been reviewed and is accurate and complete Lyndal Pulley, DO       Note: This dictation was prepared with Dragon dictation along with smaller phrase technology. Any transcriptional errors that result from this process are unintentional.

## 2020-01-09 DIAGNOSIS — K116 Mucocele of salivary gland: Secondary | ICD-10-CM | POA: Diagnosis not present

## 2020-01-19 ENCOUNTER — Other Ambulatory Visit: Payer: Self-pay

## 2020-01-19 ENCOUNTER — Encounter: Payer: Self-pay | Admitting: Family Medicine

## 2020-01-19 ENCOUNTER — Ambulatory Visit (INDEPENDENT_AMBULATORY_CARE_PROVIDER_SITE_OTHER): Payer: 59 | Admitting: Family Medicine

## 2020-01-19 VITALS — BP 108/68 | HR 68 | Ht 67.5 in | Wt 156.0 lb

## 2020-01-19 DIAGNOSIS — M7601 Gluteal tendinitis, right hip: Secondary | ICD-10-CM

## 2020-01-19 DIAGNOSIS — M999 Biomechanical lesion, unspecified: Secondary | ICD-10-CM

## 2020-01-19 NOTE — Assessment & Plan Note (Signed)
Patient is doing relatively well at this moment.  Discussed with patient about icing regimen and home exercises.  Doing relatively well but I do not feel that PRP is necessary again at this time.  Continues to respond to the conservative therapy.  Follow-up again in 4 to 8 weeks.  Discussed medication management including the cyclobenzaprine

## 2020-01-19 NOTE — Patient Instructions (Signed)
See me again in 5-6 weeks 

## 2020-01-19 NOTE — Progress Notes (Signed)
[place] Phone: [phone] Subjective:   [name], am serving as a scribe for Dr. Hulan Saas. This visit occurred during the SARS-CoV-2 public health emergency.  Safety protocols were in place, including screening questions prior to the visit, additional usage of staff PPE, and extensive cleaning of exam room while observing appropriate contact time as indicated for disinfecting solutions.   I'm seeing this patient by the request  of:  Mar Daring, PA-C  CC: Neck and back pain follow-up  [id number]  Kendra [name] is a 49 y.o. female coming in with complaint of back and neck pain. OMT 12/11/2019. Patient states that she has good and bad days. Is having an increase in right hip pain as she is due for manipulation.  Left elbow is slowly improving.   Medications patient has been prescribed: Cyclobenzaprine, vitamin D  Taking: Yes         Reviewed prior external information including notes and imaging from previsou exam, outside providers and external EMR if available.   As well as notes that were available from care everywhere and other healthcare systems.  Past medical history, social, surgical and family history all reviewed in electronic medical record.  No pertanent information unless stated regarding to the chief complaint.   Past Medical History:  Diagnosis Date  . Allergy   . Breast cancer ([place]) left   2004 with chemo tx  . Osteoporosis   . Personal history of chemotherapy     No Known Allergies   Review of Systems:  No headache, visual changes, nausea, vomiting, diarrhea, constipation, dizziness, abdominal pain, skin rash, fevers, chills, night sweats, weight loss, swollen lymph nodes, body aches, joint swelling, chest pain, shortness of breath, mood changes. POSITIVE muscle aches  Objective  Blood pressure 108/68, pulse 68, height 5' 7.5" (1.715 m), weight 156 lb (70.8  kg), SpO2 97 %.   General: No apparent distress alert and oriented x3 mood and affect normal, dressed appropriately.  HEENT: Pupils equal, extraocular movements intact  Respiratory: Patient's speak in full sentences and does not appear short of breath  Cardiovascular: No lower extremity edema, non tender, no erythema  Neuro: Cranial nerves II through XII are intact, neurovascularly intact in all extremities with 2+ DTRs and 2+ pulses.  Gait normal with good balance and coordination.  MSK:  Non tender with full range of motion and good stability and symmetric strength and tone of shoulders, elbows, wrist, hip, knee and ankles bilaterally.  Back -low back exam does have tightness on the right greater than left.  Seems to be more in the piriformis and gluteal area.  Patient has a negative straight leg test.  Neurovascularly intact.  No weakness noted at this time.  Does have some mild limited range of motion.  Osteopathic findings  C2 flexed rotated and side bent right C6 flexed rotated and side bent left T3 extended rotated and side bent right inhaled rib T9 extended rotated and side bent left L2 flexed rotated and side bent right Sacrum right on right       Assessment and Plan:  Gluteal tendonitis of right buttock Patient is doing relatively well at this moment.  Discussed with patient about icing regimen and home exercises.  Doing relatively well but I do not feel that PRP is necessary again at this time.  Continues to respond to the conservative therapy.  Follow-up again in 4 to 8 weeks.  Discussed  medication management including the cyclobenzaprine    Nonallopathic problems  Decision today to treat with OMT was based on Physical Exam  After verbal consent patient was treated with HVLA, ME, FPR techniques in cervical, rib, thoracic, lumbar, and sacral  areas  Patient tolerated the procedure well with improvement in symptoms  Patient given exercises, stretches and lifestyle  modifications  See medications in patient instructions if given  Patient will follow up in 4-8 weeks      The above documentation has been reviewed and is accurate and complete Kendra Pulley, DO       Note: This dictation was prepared with Dragon dictation along with smaller phrase technology. Any transcriptional errors that result from this process are unintentional.

## 2020-01-29 ENCOUNTER — Encounter: Payer: Self-pay | Admitting: Physician Assistant

## 2020-01-29 DIAGNOSIS — B001 Herpesviral vesicular dermatitis: Secondary | ICD-10-CM

## 2020-01-29 MED ORDER — VALACYCLOVIR HCL 1 G PO TABS: 2000.0000 mg | ORAL_TABLET | Freq: Two times a day (BID) | ORAL | 6 refills | Status: AC

## 2020-03-03 NOTE — Progress Notes (Signed)
[place] [address] [place] Phone: [phone] Subjective:   I Kendra [name] am serving as a Education administrator for Dr. Hulan Saas.  This visit occurred during the SARS-CoV-2 public health emergency.  Safety protocols were in place, including screening questions prior to the visit, additional usage of staff PPE, and extensive cleaning of exam room while observing appropriate contact time as indicated for disinfecting solutions.   I'm seeing this patient by the request  of:  Mar Daring, PA-C  CC: Neck and back pain follow-up [id number]  Kendra [name] is a 49 y.o. female coming in with complaint of back and neck pain. OMT 01/19/2020. Patient states she is doing alright today. Back is doing well. Has been having trouble with her neck. Has been doing push ups and shoulder press and it is causing pain. Lifting 10 lbs weights as of last week. Believes she slept wrong. With a lot of squats the right leg bothers her.    Medications patient has been prescribed: Flexeril, Vit D          Reviewed prior external information including notes and imaging from previsou exam, outside providers and external EMR if available.   As well as notes that were available from care everywhere and other healthcare systems.  Past medical history, social, surgical and family history all reviewed in electronic medical record.  No pertanent information unless stated regarding to the chief complaint.   Past Medical History:  Diagnosis Date  . Allergy   . Breast cancer ([place]) left   2004 with chemo tx  . Osteoporosis   . Personal history of chemotherapy     No Known Allergies   Review of Systems:  No headache, visual changes, nausea, vomiting, diarrhea, constipation, dizziness, abdominal pain, skin rash, fevers, chills, night sweats, weight loss, swollen lymph nodes, body aches, joint swelling, chest pain, shortness of breath, mood changes. POSITIVE  muscle aches  Objective  Blood pressure 134/84, pulse 63, height 5' 7.5" (1.715 m), weight 156 lb (70.8 kg), SpO2 98 %.   General: No apparent distress alert and oriented x3 mood and affect normal, dressed appropriately.  HEENT: Pupils equal, extraocular movements intact  Respiratory: Patient's speak in full sentences and does not appear short of breath  Cardiovascular: No lower extremity edema, non tender, no erythema  Neuro: Cranial nerves II through XII are intact, neurovascularly intact in all extremities with 2+ DTRs and 2+ pulses.  Gait normal with good balance and coordination.  MSK:  Non tender with full range of motion and good stability and symmetric strength and tone of shoulders, elbows, wrist, hip, knee and ankles bilaterally.  Back -mild decrease in lordosis at the moment.  Patient does have tightness with FABER test bilaterally today.  Negative straight leg test.  Neck exam does have some tightness noted in the parascapular region all the way up to the occipital region left greater than right.  Patient does does have more of a slipped rib on the right side causing more discomfort and pain.  Osteopathic findings  C7 flexed rotated and side bent left T3 extended rotated and side bent right inhaled rib T9 extended rotated and side bent left L3 flexed rotated and side bent right Sacrum right on right       Assessment and Plan: Lumbar radiculopathy Stable overall.  Discussed the Flexeril, if needed for breakthrough pain.  Prescription icing regimen and home exercises.  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  We discussed augmenting certain exercises that I think will be helpful follow-up again 6 to 8 weeks  Neck pain Patient has had neck pain now only be more than usual.  We may need x-rays at follow-up if this continues.  Has Flexeril for nighttime relief.  Discussed with him lifting  mechanics.  Follow-up in 6 to 8 weeks     Nonallopathic problems  Decision today to treat with OMT was based on Physical Exam  After verbal consent patient was treated with HVLA, ME, FPR techniques in cervical, rib, thoracic, lumbar, and sacral  areas  Patient tolerated the procedure well with improvement in symptoms  Patient given exercises, stretches and lifestyle modifications  See medications in patient instructions if given  Patient will follow up in 4-8 weeks      The above documentation has been reviewed and is accurate and complete Lyndal Pulley, DO       Note: This dictation was prepared with Dragon dictation along with smaller phrase technology. Any transcriptional errors that result from this process are unintentional.

## 2020-03-04 ENCOUNTER — Other Ambulatory Visit: Payer: Self-pay

## 2020-03-04 ENCOUNTER — Ambulatory Visit (INDEPENDENT_AMBULATORY_CARE_PROVIDER_SITE_OTHER): Payer: 59 | Admitting: Family Medicine

## 2020-03-04 ENCOUNTER — Encounter: Payer: Self-pay | Admitting: Family Medicine

## 2020-03-04 VITALS — BP 134/84 | HR 63 | Ht 67.5 in | Wt 156.0 lb

## 2020-03-04 DIAGNOSIS — M5416 Radiculopathy, lumbar region: Secondary | ICD-10-CM

## 2020-03-04 DIAGNOSIS — M542 Cervicalgia: Secondary | ICD-10-CM | POA: Diagnosis not present

## 2020-03-04 DIAGNOSIS — M999 Biomechanical lesion, unspecified: Secondary | ICD-10-CM

## 2020-03-04 NOTE — Assessment & Plan Note (Signed)
Patient has had neck pain now only be more than usual.  We may need x-rays at follow-up if this continues.  Has Flexeril for nighttime relief.  Discussed with him lifting mechanics.  Follow-up in 6 to 8 weeks

## 2020-03-04 NOTE — Patient Instructions (Addendum)
Good to see you Neutral pushups  No more overhead press Sorry for the pain See me again in 6-7 weeks

## 2020-03-04 NOTE — Assessment & Plan Note (Signed)
Stable overall.  Discussed the Flexeril, if needed for breakthrough pain.  Prescription icing regimen and home exercises.  Patient does have known arthritic changes of the acromioclavicular joint that could be contributing to some of the shoulder pain.  We discussed augmenting certain exercises that I think will be helpful follow-up again 6 to 8 weeks

## 2020-04-14 ENCOUNTER — Encounter: Payer: Self-pay | Admitting: Family Medicine

## 2020-04-14 ENCOUNTER — Ambulatory Visit (INDEPENDENT_AMBULATORY_CARE_PROVIDER_SITE_OTHER): Payer: 59 | Admitting: Family Medicine

## 2020-04-14 ENCOUNTER — Other Ambulatory Visit: Payer: Self-pay

## 2020-04-14 VITALS — BP 120/78 | HR 74 | Ht 67.5 in | Wt 154.0 lb

## 2020-04-14 DIAGNOSIS — M999 Biomechanical lesion, unspecified: Secondary | ICD-10-CM | POA: Diagnosis not present

## 2020-04-14 DIAGNOSIS — M533 Sacrococcygeal disorders, not elsewhere classified: Secondary | ICD-10-CM | POA: Diagnosis not present

## 2020-04-14 NOTE — Progress Notes (Signed)
Kendra [name] [address] [place] [name] Phone: [phone] Subjective:   I Kendra [name] am serving as a scribe for Dr. Hulan Saas  This visit occurred during the SARS-CoV-2 public health emergency.  Safety protocols were in place, including screening questions prior to the visit, additional usage of staff PPE, and extensive cleaning of exam room while observing appropriate contact time as indicated for disinfecting solutions.   I'm seeing this patient by the request  of:  Mar Daring, PA-C  CC: Low back and hip pain follow-up  [id number]  Kendra [name] is a 49 y.o. female coming in with complaint of back and neck pain. OMT 03/04/2020. Patient states her neck is still sore. Not sure why it is bothering her. Believes she is sleeping wrong. Back is painful but is better. Not as much pain in the hip. Had decreased her exercising but is still doing them. Hand is still the same on the left.   Medications patient has been prescribed: Vit D  Taking: Yes         Reviewed prior external information including notes and imaging from previsou exam, outside providers and external EMR if available.   As well as notes that were available from care everywhere and other healthcare systems.  Past medical history, social, surgical and family history all reviewed in electronic medical record.  No pertanent information unless stated regarding to the chief complaint.   Past Medical History:  Diagnosis Date  . Allergy   . Breast cancer ([place]) left   2004 with chemo tx  . Osteoporosis   . Personal history of chemotherapy     No Known Allergies   Review of Systems:  No headache, visual changes, nausea, vomiting, diarrhea, constipation, dizziness, abdominal pain, skin rash, fevers, chills, night sweats, weight loss, swollen lymph nodes, body aches, joint swelling, chest pain, shortness of breath, mood changes. POSITIVE muscle aches  Objective   Blood pressure 120/78, pulse 74, height 5' 7.5" (1.715 m), weight 154 lb (69.9 kg), SpO2 98 %.   General: No apparent distress alert and oriented x3 mood and affect normal, dressed appropriately.  HEENT: Pupils equal, extraocular movements intact  Respiratory: Patient's speak in full sentences and does not appear short of breath  Cardiovascular: No lower extremity edema, non tender, no erythema  Neuro: Cranial nerves II through XII are intact, neurovascularly intact in all extremities with 2+ DTRs and 2+ pulses.  Gait normal with good balance and coordination.  MSK:  Non tender with full range of motion and good stability and symmetric strength and tone of shoulders, elbows, wrist, hip, knee and ankles bilaterally.  Back -mild tightness around the right sacroiliac joint.  Tender to palpation in this area.  Osteopathic findings   C6 flexed rotated and side bent right T3 extended rotated and side bent right inhaled rib T9 extended rotated and side bent left L2 flexed rotated and side bent right Sacrum right on right       Assessment and Plan:  SI (sacroiliac) joint dysfunction Chronic problem.  Mild exacerbation discussed posture and ergonomics, discussed hip abductor strengthening.  Seems to be stable overall.  Patient is continuing to workout on a fairly regular basis.  Has discussed the Flexeril as needed.  Patient is able to stay active.  No other significant changes in management.  Follow-up again 6 to 8 weeks    Nonallopathic problems  Decision today to treat with OMT was based on Physical Exam  After verbal consent patient was treated with HVLA, ME, FPR techniques in cervical, rib, thoracic, lumbar, and sacral  areas  Patient tolerated the procedure well with improvement in symptoms  Patient given exercises, stretches and lifestyle modifications  See medications in patient instructions if given  Patient will follow up in 4-8 weeks      The above documentation has  been reviewed and is accurate and complete Lyndal Pulley, DO       Note: This dictation was prepared with Dragon dictation along with smaller phrase technology. Any transcriptional errors that result from this process are unintentional.

## 2020-04-14 NOTE — Assessment & Plan Note (Signed)
Chronic problem.  Mild exacerbation discussed posture and ergonomics, discussed hip abductor strengthening.  Seems to be stable overall.  Patient is continuing to workout on a fairly regular basis.  Has discussed the Flexeril as needed.  Patient is able to stay active.  No other significant changes in management.  Follow-up again 6 to 8 weeks

## 2020-04-14 NOTE — Patient Instructions (Signed)
Good to see you Keep working on it See me again in 4-6 weeks

## 2020-05-17 NOTE — Progress Notes (Signed)
[name] [place] Phone: [phone] Subjective:   Kendra [name], am serving as a scribe for Dr. Hulan Saas. This visit occurred during the SARS-CoV-2 public health emergency.  Safety protocols were in place, including screening questions prior to the visit, additional usage of staff PPE, and extensive cleaning of exam room while observing appropriate contact time as indicated for disinfecting solutions.   I'm seeing this patient by the request  of:  Mar Daring, PA-C  CC: Neck and back pain follow-up  [id number]  Kendra [name] is a 49 y.o. female coming in with complaint of back and neck pain. OMT 04/14/2020. Patient states she is doing pretty good that about at 5 weeks she starts feeling the pain and is ready to come in.  Patient states continues to be able to workout on a regular basis.  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  We do see her as a patient as well.  Medications patient has been prescribed: Vit D, Flexeril  Taking:Vit D         Reviewed prior external information including notes and imaging from previsou exam, outside providers and external EMR if available.   As well as notes that were available from care everywhere and other healthcare systems.  Past medical history, social, surgical and family history all reviewed in electronic medical record.  No pertanent information unless stated regarding to the chief complaint.   Past Medical History:  Diagnosis Date  . Allergy   . Breast cancer ([place]) left   2004 with chemo tx  . Osteoporosis   . Personal history of chemotherapy     No Known Allergies   Review of Systems:  No headache, visual changes, nausea, vomiting, diarrhea, constipation, dizziness, abdominal pain, skin rash, fevers, chills, night sweats, weight loss, swollen lymph nodes, body aches, joint swelling, chest pain,  shortness of breath, mood changes. POSITIVE muscle aches  Objective  Blood pressure 110/70, pulse 82, height 5' 7.5" (1.715 m), weight 153 lb (69.4 kg), SpO2 99 %.   General: No apparent distress alert and oriented x3 mood and affect normal, dressed appropriately.  HEENT: Pupils equal, extraocular movements intact  Respiratory: Patient's speak in full sentences and does not appear short of breath  Cardiovascular: No lower extremity edema, non tender, no erythema  Neuro: Cranial nerves II through XII are intact, neurovascularly intact in all extremities with 2+ DTRs and 2+ pulses.  Gait normal with good balance and coordination.  MSK:  Non tender with full range of motion and good stability and symmetric strength and tone of shoulders, elbows, wrist, hip, knee and ankles bilaterally.  Back - Normal skin, tightness noted right greater than left.  Mild positive Corky Sox on the right.  Mild pain extremely thoracolumbar juncture with extension.  No spinous process tenderness.  5 out of 5 strength lower extremities  Osteopathic findings  C7 flexed rotated and side bent left T3 extended rotated and side bent right inhaled rib T8 extended rotated and side bent left L2 flexed rotated and side bent right Sacrum right on right       Assessment and Plan:  SI (sacroiliac) joint dysfunction Chronic problem with mild exacerbation.  Patient does have some underlying stress as well.  Has been given Flexeril previously.  Does do the once weekly vitamin D.  Discussed icing regimen and home exercises follow-up with me again in 4 to 8  weeks    Nonallopathic problems  Decision today to treat with OMT was based on Physical Exam  After verbal consent patient was treated with HVLA, ME, FPR techniques in cervical, rib, thoracic, lumbar, and sacral  areas  Patient tolerated the procedure well with improvement in symptoms  Patient given exercises, stretches and lifestyle modifications  See medications in  patient instructions if given  Patient will follow up in 4-8 weeks      The above documentation has been reviewed and is accurate and complete Lyndal Pulley, DO       Note: This dictation was prepared with Dragon dictation along with smaller phrase technology. Any transcriptional errors that result from this process are unintentional.

## 2020-05-19 ENCOUNTER — Ambulatory Visit (INDEPENDENT_AMBULATORY_CARE_PROVIDER_SITE_OTHER): Payer: 59 | Admitting: Family Medicine

## 2020-05-19 ENCOUNTER — Encounter: Payer: Self-pay | Admitting: Family Medicine

## 2020-05-19 ENCOUNTER — Other Ambulatory Visit: Payer: Self-pay

## 2020-05-19 VITALS — BP 110/70 | HR 82 | Ht 67.5 in | Wt 153.0 lb

## 2020-05-19 DIAGNOSIS — M533 Sacrococcygeal disorders, not elsewhere classified: Secondary | ICD-10-CM

## 2020-05-19 DIAGNOSIS — M999 Biomechanical lesion, unspecified: Secondary | ICD-10-CM

## 2020-05-19 NOTE — Assessment & Plan Note (Signed)
Chronic problem with mild exacerbation.  Patient does have some underlying stress as well.  Has been given Flexeril previously.  Does do the once weekly vitamin D.  Discussed icing regimen and home exercises follow-up with me again in 4 to 8 weeks

## 2020-05-19 NOTE — Patient Instructions (Addendum)
Good to see you  [facility] If you need anything give me a call See me again in 5-6 weeks

## 2020-05-20 DIAGNOSIS — H52223 Regular astigmatism, bilateral: Secondary | ICD-10-CM | POA: Diagnosis not present

## 2020-05-20 DIAGNOSIS — H5213 Myopia, bilateral: Secondary | ICD-10-CM | POA: Diagnosis not present

## 2020-05-20 DIAGNOSIS — H524 Presbyopia: Secondary | ICD-10-CM | POA: Diagnosis not present

## 2020-06-08 NOTE — Progress Notes (Signed)
[place] Phone: [phone] Subjective:   Kendra [name], am serving as a scribe for Dr. Hulan Saas. This visit occurred during the SARS-CoV-2 public health emergency.  Safety protocols were in place, including screening questions prior to the visit, additional usage of staff PPE, and extensive cleaning of exam room while observing appropriate contact time as indicated for disinfecting solutions.   I'm seeing this patient by the request  of:  Mar Daring, PA-C  CC: Back pain follow-up, right elbow pain  [id number]  Kendra [name] is a 49 y.o. female coming in with complaint of back and neck pain. OMT 05/19/2020. Patient states that her back and hip pain have improved. Right hip does get sore when she works out on a regular basis.  [name] numbness or tingling, [name] significant radiation down the leg.  Continues to try to stay active.  Continues to have right elbow medial epicondyle pain. Pain with kettlebell use.  Patient has not been doing the exercises that were suggested previously.  Medications patient has been prescribed: None         Reviewed prior external information including notes and imaging from previsou exam, outside providers and external EMR if available.   As well as notes that were available from care everywhere and other healthcare systems.  Past medical history, social, surgical and family history all reviewed in electronic medical record.  [name] pertanent information unless stated regarding to the chief complaint.   Past Medical History:  Diagnosis Date  . Allergy   . Breast cancer ([place]) left   2004 with chemo tx  . Osteoporosis   . Personal history of chemotherapy     [name] Known Allergies   Review of Systems:  [name] headache, visual changes, nausea, vomiting, diarrhea, constipation, dizziness, abdominal pain, skin rash, fevers, chills, night sweats, weight loss, swollen lymph nodes,  body aches, joint swelling, chest pain, shortness of breath, mood changes. POSITIVE muscle aches  Objective  Blood pressure 110/82, pulse 65, height 5' 7.5" (1.715 m), weight 157 lb (71.2 kg), SpO2 98 %.   General: [name] apparent distress alert and oriented x3 mood and affect normal, dressed appropriately.  HEENT: Pupils equal, extraocular movements intact  Respiratory: Patient's speak in full sentences and does not appear short of breath  Cardiovascular: [name] lower extremity edema, non tender, [name] erythema  Neuro: Cranial nerves II through XII are intact, neurovascularly intact in all extremities with 2+ DTRs and 2+ pulses.  Gait normal with good balance and coordination.  MSK: Right elbow shows the patient does have tenderness to palpation of the medial epicondylar region.  Patient does have pain with flexion of the wrist.  Negative Tinel's on that side.  Full range of motion of the elbow otherwise. Back -low back exam does have some mild loss of lordosis, some tenderness to palpation in the paraspinal musculature lumbar spine right greater than left.  Mild over the right sacroiliac joint  Osteopathic findings  C2 flexed rotated and side bent right C6 flexed rotated and side bent left T4 extended rotated and side bent right inhaled rib T7 extended rotated and side bent left L2 flexed rotated and side bent right Sacrum right on right  Limited musculoskeletal ultrasound was performed and interpreted by Lyndal Pulley  Limited ultrasound of patient's right elbow shows the patient does have some mild increase in neovascularization in the intrasubstance aspect of the common flexor tendon near the medial  epicondylar region.  [name] significant retraction noted.  With mild scar tissue formation noted Impression: Medial epicondylitis with partial tear of the flexor common tendon [name] retraction     Assessment and Plan:  Medial epicondylitis of elbow, right On ultrasound patient does have a very small  partial tear with increasing neovascularization.  [name] significant retraction noted.  Likely is contributing to some of the pain.  Discussed avoiding repetitive flexion of the wrist.  Wrist brace given.  Discussed topical anti-inflammatories.  Patient has done the nitroglycerin patches previously for other ailments but has had difficulty with headache so we will hold for now.  Follow-up with me again 4 to 6 weeks  SI (sacroiliac) joint dysfunction Chronic problem with mild exacerbation.  Responding well to osteopathic manipulation.  Patient is taking mostly over-the-counter medications when necessary.  Discussed posture and ergonomics.  Continue to stay active.  Follow-up again in 4 to 8 weeks    Nonallopathic problems  Decision today to treat with OMT was based on Physical Exam  After verbal consent patient was treated with HVLA, ME, FPR techniques in cervical, rib, thoracic, lumbar, and sacral  areas  Patient tolerated the procedure well with improvement in symptoms  Patient given exercises, stretches and lifestyle modifications  See medications in patient instructions if given  Patient will follow up in 4-8 weeks      The above documentation has been reviewed and is accurate and complete Judi Saa, DO       Note: This dictation was prepared with Dragon dictation along with smaller phrase technology. Any transcriptional errors that result from this process are unintentional.

## 2020-06-09 ENCOUNTER — Ambulatory Visit (INDEPENDENT_AMBULATORY_CARE_PROVIDER_SITE_OTHER): Payer: 59 | Admitting: Family Medicine

## 2020-06-09 ENCOUNTER — Ambulatory Visit: Payer: Self-pay

## 2020-06-09 ENCOUNTER — Other Ambulatory Visit: Payer: Self-pay

## 2020-06-09 ENCOUNTER — Encounter: Payer: Self-pay | Admitting: Family Medicine

## 2020-06-09 VITALS — BP 110/82 | HR 65 | Ht 67.5 in | Wt 157.0 lb

## 2020-06-09 DIAGNOSIS — M999 Biomechanical lesion, unspecified: Secondary | ICD-10-CM | POA: Diagnosis not present

## 2020-06-09 DIAGNOSIS — M25521 Pain in right elbow: Secondary | ICD-10-CM | POA: Diagnosis not present

## 2020-06-09 DIAGNOSIS — M533 Sacrococcygeal disorders, not elsewhere classified: Secondary | ICD-10-CM | POA: Diagnosis not present

## 2020-06-09 DIAGNOSIS — M7701 Medial epicondylitis, right elbow: Secondary | ICD-10-CM

## 2020-06-09 NOTE — Assessment & Plan Note (Signed)
Chronic problem with mild exacerbation.  Responding well to osteopathic manipulation.  Patient is taking mostly over-the-counter medications when necessary.  Discussed posture and ergonomics.  Continue to stay active.  Follow-up again in 4 to 8 weeks

## 2020-06-09 NOTE — Patient Instructions (Signed)
Wrist brace at day/night for 2 weeks, nightly for 2 weeks thereafter Ice 20 min 2x a day Pennsaid See me in 6 weeks

## 2020-06-09 NOTE — Assessment & Plan Note (Signed)
On ultrasound patient does have a very small partial tear with increasing neovascularization.  No significant retraction noted.  Likely is contributing to some of the pain.  Discussed avoiding repetitive flexion of the wrist.  Wrist brace given.  Discussed topical anti-inflammatories.  Patient has done the nitroglycerin patches previously for other ailments but has had difficulty with headache so we will hold for now.  Follow-up with me again 4 to 6 weeks

## 2020-07-20 ENCOUNTER — Other Ambulatory Visit: Payer: Self-pay | Admitting: Physician Assistant

## 2020-07-20 ENCOUNTER — Encounter: Payer: Self-pay | Admitting: Physician Assistant

## 2020-07-20 ENCOUNTER — Telehealth (INDEPENDENT_AMBULATORY_CARE_PROVIDER_SITE_OTHER): Payer: 59

## 2020-07-20 DIAGNOSIS — R300 Dysuria: Secondary | ICD-10-CM

## 2020-07-20 LAB — POCT URINALYSIS DIPSTICK
Bilirubin, UA: NEGATIVE
Glucose, UA: NEGATIVE
Ketones, UA: NEGATIVE
Nitrite, UA: NEGATIVE
Protein, UA: NEGATIVE
Spec Grav, UA: 1.025 (ref 1.010–1.025)
Urobilinogen, UA: 0.2 E.U./dL
pH, UA: 6 (ref 5.0–8.0)

## 2020-07-20 MED ORDER — SULFAMETHOXAZOLE-TRIMETHOPRIM 800-160 MG PO TABS: 1.0000 | ORAL_TABLET | Freq: Two times a day (BID) | ORAL | 0 refills | Status: DC

## 2020-07-20 NOTE — Addendum Note (Signed)
Addended by: Mar Daring on: 07/20/2020 04:01 PM   Modules accepted: Orders

## 2020-07-20 NOTE — Telephone Encounter (Signed)
UA and culture ordered

## 2020-07-21 NOTE — Progress Notes (Unsigned)
[name] [place] Phone: [phone] Subjective:   Kendra [name], am serving as a scribe for Dr. Hulan Saas.  This visit occurred during the SARS-CoV-2 public health emergency.  Safety protocols were in place, including screening questions prior to the visit, additional usage of staff PPE, and extensive cleaning of exam room while observing appropriate contact time as indicated for disinfecting solutions.   I'm seeing this patient by the request  of:  Mar Daring, PA-C  CC: Elbow pain follow-up, back pain follow-up  [id number]   06/09/2020 Medial epicondylitis of elbow, right On ultrasound patient does have a very small partial tear with increasing neovascularization.  No significant retraction noted.  Likely is contributing to some of the pain.  Discussed avoiding repetitive flexion of the wrist.  Wrist brace given.  Discussed topical anti-inflammatories.  Patient has done the nitroglycerin patches previously for other ailments but has had difficulty with headache so we will hold for now.  Follow-up with me again 4 to 6 weeks  Update 07/22/2020 Kendra [name] is a 50 y.o. female coming in with complaint of back and neck pain. OMT 06/09/2020. Patient was to wear wrist brace for medial epi, right. Patient states that her back is just ready for manipulation and her Golfers Elbow R is no better.  Patient states that she has been wearing the brace at night with no significant benefit.  Patient states maybe it did help initially but now seems to be worsening again.          Reviewed prior external information including notes and imaging from previsou exam, outside providers and external EMR if available.   As well as notes that were available from care everywhere and other healthcare systems.  Past medical history, social, surgical and family history all reviewed in electronic medical record.  No pertanent  information unless stated regarding to the chief complaint.   Past Medical History:  Diagnosis Date  . Allergy   . Breast cancer ([place]) left   2004 with chemo tx  . Osteoporosis   . Personal history of chemotherapy     No Known Allergies   Review of Systems:  No headache, visual changes, nausea, vomiting, diarrhea, constipation, dizziness, abdominal pain, skin rash, fevers, chills, night sweats, weight loss, swollen lymph nodes, body aches, joint swelling, chest pain, shortness of breath, mood changes. POSITIVE muscle aches  Objective  Blood pressure 110/70, pulse 87, height 5' 7.5" (1.715 m), weight 155 lb (70.3 kg), SpO2 97 %.   General: No apparent distress alert and oriented x3 mood and affect normal, dressed appropriately.  HEENT: Pupils equal, extraocular movements intact  Respiratory: Patient's speak in full sentences and does not appear short of breath  Cardiovascular: No lower extremity edema, non tender, no erythema  Neuro: Cranial nerves II through XII are intact, neurovascularly intact in all extremities with 2+ DTRs and 2+ pulses.  Gait normal with good balance and coordination.  MSK: Right elbow exam shows the patient is tender to palpation right over the medial epicondylar region.  No swelling noted.  Negative Tinel's.  Patient does have fairly full range of motion of the elbow.  Only has pain with some mild resisted flexion as well. Back -back exam does show some mild loss of lordosis.  Patient does have more tenderness over the right greater trochanteric area.  Mild pain noted over the gluteal area as well.  Mild pain over the sacroiliac joint  right greater than left   Osteopathic findings  C2 flexed rotated and side bent right C6 flexed rotated and side bent left T3 extended rotated and side bent right inhaled rib T9 extended rotated and side bent left L2 flexed rotated and side bent right Sacrum right on right       Assessment and Plan:  Medial  epicondylitis of elbow, right Patient still is having pain 6 weeks out from the injury.  Patient on exam on her quick ultrasound did not show any true tearing noted today.  Patient though will continue with the home exercises.  Discussed differential could be more ulnar neuropathy and given gabapentin to help with nighttime pain.  Continue to consider the bracing.  We discussed x-rays which patient declined.  Patient will increase activity slowly.  Follow-up again in 6 weeks  Lumbar radiculopathy Relatively stable.  Discussed home exercises and icing regimen.  Discussed core strengthening.  Patient has done very well overall.  Discussed posture and ergonomics.  Patient will increase activity slowly.  Follow-up again 4 to 8 weeks    Nonallopathic problems  Decision today to treat with OMT was based on Physical Exam  After verbal consent patient was treated with HVLA, ME, FPR techniques in cervical, rib, thoracic, lumbar, and sacral  areas  Patient tolerated the procedure well with improvement in symptoms  Patient given exercises, stretches and lifestyle modifications  See medications in patient instructions if given  Patient will follow up in 4-8 weeks      The above documentation has been reviewed and is accurate and complete Lyndal Pulley, DO       Note: This dictation was prepared with Dragon dictation along with smaller phrase technology. Any transcriptional errors that result from this process are unintentional.

## 2020-07-22 ENCOUNTER — Other Ambulatory Visit: Payer: Self-pay | Admitting: Family Medicine

## 2020-07-22 ENCOUNTER — Encounter: Payer: Self-pay | Admitting: Family Medicine

## 2020-07-22 ENCOUNTER — Ambulatory Visit (INDEPENDENT_AMBULATORY_CARE_PROVIDER_SITE_OTHER): Payer: 59 | Admitting: Family Medicine

## 2020-07-22 ENCOUNTER — Other Ambulatory Visit: Payer: Self-pay

## 2020-07-22 VITALS — BP 110/70 | HR 87 | Ht 67.5 in | Wt 155.0 lb

## 2020-07-22 DIAGNOSIS — M999 Biomechanical lesion, unspecified: Secondary | ICD-10-CM

## 2020-07-22 DIAGNOSIS — M5416 Radiculopathy, lumbar region: Secondary | ICD-10-CM

## 2020-07-22 DIAGNOSIS — M7701 Medial epicondylitis, right elbow: Secondary | ICD-10-CM | POA: Diagnosis not present

## 2020-07-22 LAB — URINE CULTURE

## 2020-07-22 MED ORDER — GABAPENTIN 100 MG PO CAPS: 200.0000 mg | ORAL_CAPSULE | Freq: Every day | ORAL | 3 refills | Status: DC

## 2020-07-22 NOTE — Patient Instructions (Addendum)
Good to see you  Gabapentin 200 mg at night Keep working on the elbow If worsening pain consider PT and xrays See me again in 6 weeks

## 2020-07-22 NOTE — Assessment & Plan Note (Signed)
Patient still is having pain 6 weeks out from the injury.  Patient on exam on her quick ultrasound did not show any true tearing noted today.  Patient though will continue with the home exercises.  Discussed differential could be more ulnar neuropathy and given gabapentin to help with nighttime pain.  Continue to consider the bracing.  We discussed x-rays which patient declined.  Patient will increase activity slowly.  Follow-up again in 6 weeks

## 2020-07-22 NOTE — Assessment & Plan Note (Signed)
Relatively stable.  Discussed home exercises and icing regimen.  Discussed core strengthening.  Patient has done very well overall.  Discussed posture and ergonomics.  Patient will increase activity slowly.  Follow-up again 4 to 8 weeks

## 2020-09-01 NOTE — Progress Notes (Unsigned)
[facility] [address] [place] Phone: [phone] Subjective:   I Kendra [name] am serving as a Education administrator for Dr. Hulan Saas.  This visit occurred during the SARS-CoV-2 public health emergency.  Safety protocols were in place, including screening questions prior to the visit, additional usage of staff PPE, and extensive cleaning of exam room while observing appropriate contact time as indicated for disinfecting solutions.   I'm seeing this patient by the request  of:  Mar Daring, PA-C  CC: Right elbow pain follow-up, back pain follow-up  [id number]  Kendra [name] is a 50 y.o. female coming in with complaint of back and neck pain. OMT 07/22/2020. Patient states her right elbow is still pain. Back is doing well overall.  Patient states that she does cardio does notice more discomfort in the hip.  Medications patient has been prescribed: Gabapentin  Taking:         Reviewed prior external information including notes and imaging from previsou exam, outside providers and external EMR if available.   As well as notes that were available from care everywhere and other healthcare systems.  Past medical history, social, surgical and family history all reviewed in electronic medical record.  No pertanent information unless stated regarding to the chief complaint.   Past Medical History:  Diagnosis Date  . Allergy   . Breast cancer ([place]) left   2004 with chemo tx  . Osteoporosis   . Personal history of chemotherapy     No Known Allergies   Review of Systems:  No headache, visual changes, nausea, vomiting, diarrhea, constipation, dizziness, abdominal pain, skin rash, fevers, chills, night sweats, weight loss, swollen lymph nodes, body aches, joint swelling, chest pain, shortness of breath, mood changes. POSITIVE muscle aches  Objective  Blood pressure 120/70, pulse 71, height 5' 7.5" (1.715 m), weight 150 lb (68 kg),  SpO2 98 %.   General: No apparent distress alert and oriented x3 mood and affect normal, dressed appropriately.  HEENT: Pupils equal, extraocular movements intact  Respiratory: Patient's speak in full sentences and does not appear short of breath  Cardiovascular: No lower extremity edema, non tender, no erythema  Gait normal with good balance and coordination.  MSK: Right elbow exam shows the patient does have severe tenderness to palpation over the medial epicondylar region.  Patient does have very mild gapping no pain sign.  Patient does have worsening pain with that.  Worsening pain with resisted wrist flexion.  Very mild positive Tinel's sign Low back exam does show some loss of lordosis.  Continued tightness of the right hamstring noted.  Mild gluteal tendon discomfort as well.  Osteopathic findings  C6 flexed rotated and side bent left T3 extended rotated and side bent right inhaled rib T9 extended rotated and side bent left L2 flexed rotated and side bent right Sacrum right on right       Assessment and Plan:  SI (sacroiliac) joint dysfunction Chronic problem stable.  Discussed icing regimen and home exercises.  Need to work on core strength.  Patient continues also have degenerative disc disease of the lumbar spine and we will continue to watch out for any type of radicular symptoms.  Patient does seem to workout fairly hard.  Patient does have different medications such as the gabapentin which he is taking regularly and does have cyclobenzaprine but she is not taking it regularly.  Patient will continue to be active and follow-up with me again 6  to 8 weeks.  Medial epicondylitis of elbow, right Patient has not been treated for 12 weeks.  Patient has started physical therapy, home exercises, oral anti-inflammatories short course, icing regimen, knee bracing with no significant benefit.  Patient is having limited range of motion likely more secondary to the pain.  Do believe at  this point an MR arthrogram is necessary to evaluate the ulnar collateral ligament to see if this is contributing to why patient does not seem to be improving.  If intact and still just more of partial tearing of the common flexor tendon then PRP is a possibility.  Patient will follow-up after imaging to discuss.    Nonallopathic problems  Decision today to treat with OMT was based on Physical Exam  After verbal consent patient was treated with HVLA, ME, FPR techniques in cervical, rib, thoracic, lumbar, and sacral  areas  Patient tolerated the procedure well with improvement in symptoms  Patient given exercises, stretches and lifestyle modifications  See medications in patient instructions if given  Patient will follow up in 4-8 weeks      The above documentation has been reviewed and is accurate and complete Lyndal Pulley, DO       Note: This dictation was prepared with Dragon dictation along with smaller phrase technology. Any transcriptional errors that result from this process are unintentional.

## 2020-09-02 ENCOUNTER — Ambulatory Visit (INDEPENDENT_AMBULATORY_CARE_PROVIDER_SITE_OTHER): Payer: 59 | Admitting: Family Medicine

## 2020-09-02 ENCOUNTER — Encounter: Payer: Self-pay | Admitting: Family Medicine

## 2020-09-02 ENCOUNTER — Ambulatory Visit (INDEPENDENT_AMBULATORY_CARE_PROVIDER_SITE_OTHER): Payer: 59

## 2020-09-02 ENCOUNTER — Other Ambulatory Visit: Payer: Self-pay

## 2020-09-02 VITALS — BP 120/70 | HR 71 | Ht 67.5 in | Wt 150.0 lb

## 2020-09-02 DIAGNOSIS — M25521 Pain in right elbow: Secondary | ICD-10-CM

## 2020-09-02 DIAGNOSIS — M533 Sacrococcygeal disorders, not elsewhere classified: Secondary | ICD-10-CM | POA: Diagnosis not present

## 2020-09-02 DIAGNOSIS — M7701 Medial epicondylitis, right elbow: Secondary | ICD-10-CM

## 2020-09-02 DIAGNOSIS — M999 Biomechanical lesion, unspecified: Secondary | ICD-10-CM | POA: Diagnosis not present

## 2020-09-02 NOTE — Assessment & Plan Note (Signed)
Chronic problem stable.  Discussed icing regimen and home exercises.  Need to work on core strength.  Patient continues also have degenerative disc disease of the lumbar spine and we will continue to watch out for any type of radicular symptoms.  Patient does seem to workout fairly hard.  Patient does have different medications such as the gabapentin which he is taking regularly and does have cyclobenzaprine but she is not taking it regularly.  Patient will continue to be active and follow-up with me again 6 to 8 weeks.

## 2020-09-02 NOTE — Patient Instructions (Addendum)
Good to see you Xray today Keep doing everything else See me again in 5-6 weks

## 2020-09-02 NOTE — Assessment & Plan Note (Signed)
Patient has not been treated for 12 weeks.  Patient has started physical therapy, home exercises, oral anti-inflammatories short course, icing regimen, knee bracing with no significant benefit.  Patient is having limited range of motion likely more secondary to the pain.  Do believe at this point an MR arthrogram is necessary to evaluate the ulnar collateral ligament to see if this is contributing to why patient does not seem to be improving.  If intact and still just more of partial tearing of the common flexor tendon then PRP is a possibility.  Patient will follow-up after imaging to discuss.

## 2020-09-03 ENCOUNTER — Encounter: Payer: Self-pay | Admitting: Physician Assistant

## 2020-09-03 ENCOUNTER — Other Ambulatory Visit: Payer: Self-pay

## 2020-09-03 ENCOUNTER — Ambulatory Visit (INDEPENDENT_AMBULATORY_CARE_PROVIDER_SITE_OTHER): Payer: 59 | Admitting: Physician Assistant

## 2020-09-03 VITALS — BP 104/81 | HR 72 | Temp 99.5°F | Ht 67.5 in | Wt 152.9 lb

## 2020-09-03 DIAGNOSIS — Z1239 Encounter for other screening for malignant neoplasm of breast: Secondary | ICD-10-CM

## 2020-09-03 DIAGNOSIS — Z0000 Encounter for general adult medical examination without abnormal findings: Secondary | ICD-10-CM | POA: Diagnosis not present

## 2020-09-03 DIAGNOSIS — Z1159 Encounter for screening for other viral diseases: Secondary | ICD-10-CM | POA: Diagnosis not present

## 2020-09-03 DIAGNOSIS — M810 Age-related osteoporosis without current pathological fracture: Secondary | ICD-10-CM

## 2020-09-03 DIAGNOSIS — Z1211 Encounter for screening for malignant neoplasm of colon: Secondary | ICD-10-CM | POA: Diagnosis not present

## 2020-09-03 DIAGNOSIS — Z853 Personal history of malignant neoplasm of breast: Secondary | ICD-10-CM

## 2020-09-03 NOTE — Progress Notes (Signed)
Complete physical exam   [name]   DOB: 01/04/1971   51 y.o. Female  MRN: [id number] Visit Date: 09/03/2020  Today's healthcare provider: Mar Daring, PA-C   Chief Complaint  Patient presents with  . Annual Exam  I,Porsha C McClurkin,acting as a scribe for [employer], PA-C.,have documented all relevant documentation on the behalf of Mar Daring, PA-C,as directed by  Mar Daring, PA-C while in the presence of Mar Daring, PA-C.  Subjective    Kendra [name] is a 50 y.o. female who presents today for a complete physical exam.  She reports consuming a healthy diet. Gym/ health club routine includes cardio, mod to heavy weightlifting, stationary bike and treadmill. She generally feels well. She reports sleeping fairly well. She does not have additional problems to discuss today.  HPI    Past Medical History:  Diagnosis Date  . Allergy   . Breast cancer ([place]) left   2004 with chemo tx  . Osteoporosis   . Personal history of chemotherapy    Past Surgical History:  Procedure Laterality Date  . BREAST SURGERY     implant left in 2004, and biopsy  . MASTECTOMY Left 01/07/03   with tram flap and right breast lift in 2008  . TOTAL ABDOMINAL HYSTERECTOMY     related to breast cancer  . TOTAL ABDOMINAL HYSTERECTOMY W/ BILATERAL SALPINGOOPHORECTOMY  10/2003   Social History   Socioeconomic History  . Marital status: Married    Spouse name: Adam Demary  . Number of children: 2  . Years of education: Not on file  . Highest education level: Not on file  Occupational History  . Not on file  Tobacco Use  . Smoking status: Never Smoker  . Smokeless tobacco: Never Used  Vaping Use  . Vaping Use: Never used  Substance and Sexual Activity  . Alcohol use: No  . Drug use: No  . Sexual activity: Yes    Birth control/protection: Other-see comments    Comment: had total hysterectomy  Other Topics Concern  . Not on file   Social History Narrative  . Not on file   Social Determinants of Health   Financial Resource Strain: Not on file  Food Insecurity: Not on file  Transportation Needs: Not on file  Physical Activity: Not on file  Stress: Not on file  Social Connections: Not on file  Intimate Partner Violence: Not on file   Family Status  Relation Name Status  . Mother  Alive  . Father  Deceased at age 74       lung cancer  . Brother  Alive   Family History  Problem Relation Age of Onset  . Arthritis Mother   . Hypothyroidism Mother   . Breast cancer Mother 79  . Alcohol abuse Brother    No Known Allergies  Patient Care Team: Rubye Beach as PCP - General (Family Medicine)   Medications: Outpatient Medications Prior to Visit  Medication Sig  . Calcium-Vitamin D 600-200 MG-UNIT per tablet Take 1 tablet by mouth daily.   . fluticasone (FLONASE) 50 MCG/ACT nasal spray PLACE 1 SPRAY INTO BOTH NOSTRILS DAILY.  Marland Kitchen gabapentin (NEURONTIN) 100 MG capsule Take 2 capsules (200 mg total) by mouth at bedtime.  . valACYclovir (VALTREX) 1000 MG tablet Take 2 tablets (2,000 mg total) by mouth 2 (two) times daily. At onset of cold sore  . VITAMIN D, CHOLECALCIFEROL, PO Take 1 capsule  by mouth daily.  . Vitamin D, Ergocalciferol, (DRISDOL) 50000 units CAPS capsule Take 1 capsule (50,000 Units total) by mouth every 7 (seven) days.  . cyclobenzaprine (FLEXERIL) 10 MG tablet Take 1 tablet (10 mg total) by mouth 3 (three) times daily as needed for muscle spasms. (Patient not taking: No sig reported)  . loratadine-pseudoephedrine (CLARITIN-D 24 HOUR) 10-240 MG 24 hr tablet Take 1 tablet by mouth daily.  . nitroGLYCERIN (NITRODUR - DOSED IN MG/24 HR) 0.2 mg/hr patch 1/4 patch daily (Patient not taking: No sig reported)  . sulfamethoxazole-trimethoprim (BACTRIM DS) 800-160 MG tablet Take 1 tablet by mouth 2 (two) times daily. (Patient not taking: Reported on 09/03/2020)   No facility-administered  medications prior to visit.    Review of Systems  Constitutional: Negative.   HENT: Negative.   Eyes: Negative.   Respiratory: Negative.   Cardiovascular: Negative.   Gastrointestinal: Negative.   Endocrine: Negative.   Genitourinary: Negative.   Musculoskeletal: Negative.   Skin: Negative.   Allergic/Immunologic: Negative.   Neurological: Negative.   Hematological: Negative.   Psychiatric/Behavioral: Negative.       Objective    BP 104/81 (BP Location: Right Arm, Patient Position: Sitting, Cuff Size: Normal)   Pulse 72   Temp 99.5 F (37.5 C) (Oral)   Ht 5' 7.5" (1.715 m)   Wt 152 lb 14.4 oz (69.4 kg)   SpO2 99%   BMI 23.59 kg/m    Physical Exam Vitals reviewed.  Constitutional:      General: She is not in acute distress.    Appearance: Normal appearance. She is well-developed and normal weight. She is not ill-appearing or diaphoretic.  HENT:     Head: Normocephalic and atraumatic.     Right Ear: Tympanic membrane, ear canal and external ear normal.     Left Ear: Tympanic membrane, ear canal and external ear normal.     Nose: Nose normal.     Mouth/Throat:     Pharynx: No oropharyngeal exudate or posterior oropharyngeal erythema.  Eyes:     General: No scleral icterus.       Right eye: No discharge.        Left eye: No discharge.     Extraocular Movements: Extraocular movements intact.     Conjunctiva/sclera: Conjunctivae normal.     Pupils: Pupils are equal, round, and reactive to light.  Neck:     Thyroid: No thyromegaly.     Vascular: No carotid bruit or JVD.     Trachea: No tracheal deviation.  Cardiovascular:     Rate and Rhythm: Normal rate and regular rhythm.     Pulses: Normal pulses.     Heart sounds: Normal heart sounds. No murmur heard. No friction rub. No gallop.   Pulmonary:     Effort: Pulmonary effort is normal. No respiratory distress.     Breath sounds: Normal breath sounds. No wheezing or rales.  Chest:     Chest wall: No  tenderness.  Abdominal:     General: Abdomen is flat. Bowel sounds are normal. There is no distension.     Palpations: Abdomen is soft. There is no mass.     Tenderness: There is no abdominal tenderness. There is no guarding or rebound.  Musculoskeletal:        General: No tenderness. Normal range of motion.     Cervical back: Normal range of motion and neck supple. No tenderness.     Right lower leg: No edema.  Left lower leg: No edema.  Lymphadenopathy:     Cervical: No cervical adenopathy.  Skin:    General: Skin is warm and dry.     Capillary Refill: Capillary refill takes less than 2 seconds.     Findings: No rash.  Neurological:     General: No focal deficit present.     Mental Status: She is alert and oriented to person, place, and time. Mental status is at baseline.  Psychiatric:        Mood and Affect: Mood normal.        Behavior: Behavior normal.        Thought Content: Thought content normal.        Judgment: Judgment normal.     Last depression screening scores PHQ 2/9 Scores 09/03/2020 08/28/2019 12/20/2018  PHQ - 2 Score 0 0 0  PHQ- 9 Score 0 3 0  Exception Documentation - - -   Last fall risk screening Fall Risk  09/03/2020  Falls in the past year? 0  Number falls in past yr: 0  Injury with Fall? 0  Risk for fall due to : No Fall Risks  Follow up Falls evaluation completed   Last Audit-C alcohol use screening Alcohol Use Disorder Test (AUDIT) 09/03/2020  1. How often do you have a drink containing alcohol? 2  2. How many drinks containing alcohol do you have on a typical day when you are drinking? 0  3. How often do you have six or more drinks on one occasion? 0  AUDIT-C Score 2  Alcohol Brief Interventions/Follow-up -   A score of 3 or more in women, and 4 or more in men indicates increased risk for alcohol abuse, EXCEPT if all of the points are from question 1   No results found for any visits on 09/03/20.  Assessment & Plan    Routine Health  Maintenance and Physical Exam  Exercise Activities and Dietary recommendations Goals    . Exercise 150 minutes per week (moderate activity)       Immunization History  Administered Date(s) Administered  . Influenza,inj,Quad PF,6+ Mos 03/13/2015    Health Maintenance  Topic Date Due  . Hepatitis C Screening  Never done  . TETANUS/TDAP  Never done  . COLONOSCOPY (Pts 45-58yrs Insurance coverage will need to be confirmed)  Never done  . INFLUENZA VACCINE  01/11/2020  . MAMMOGRAM  08/09/2020  . HIV Screening  Completed  . HPV VACCINES  Aged Out    Discussed health benefits of physical activity, and encouraged her to engage in regular exercise appropriate for her age and condition.  1. Annual physical exam Normal physical exam today. Will check labs as below and f/u pending lab results. If labs are stable and WNL she will not need to have these rechecked for one year at her next annual physical exam. She is to call the office in the meantime if she has any acute issue, questions or concerns. - TSH - Lipid panel - Comprehensive metabolic panel - CBC with Differential/Platelet - HgB A1c  2. Need for hepatitis C screening test Will check labs as below and f/u pending results. - Hepatitis C antibody  3. Osteoporosis without current pathological fracture, unspecified osteoporosis type H/O this following breast cancer treatments. Due for repeat bone density. - DG Bone Density; Future  4. Colon cancer screening Due for colon cancer screening. Discussed with patient. She will call/message if she prefers colonoscopy vs cologuard.   5. Encounter for breast  cancer screening using non-mammogram modality Due for mammogram. Does have personal breast cancer history when she was 50 years old. - MM 3D SCREEN BREAST BILATERAL; Future  6. History of breast cancer See above medical treatment plan. - MM 3D SCREEN BREAST BILATERAL; Future - DG Bone Density; Future   No follow-ups on file.      Reynolds Bowl, PA-C, have reviewed all documentation for this visit. The documentation on 09/03/20 for the exam, diagnosis, procedures, and orders are all accurate and complete.   Rubye Beach  [facility] [phone] (phone) [phone] (fax)  [place]

## 2020-09-03 NOTE — Patient Instructions (Signed)
Preventive Care 84-51 Years Old, Female Preventive care refers to lifestyle choices and visits with your health care provider that can promote health and wellness. This includes:  A yearly physical exam. This is also called an annual wellness visit.  Regular dental and eye exams.  Immunizations.  Screening for certain conditions.  Healthy lifestyle choices, such as: ? Eating a healthy diet. ? Getting regular exercise. ? Not using drugs or products that contain nicotine and tobacco. ? Limiting alcohol use. What can I expect for my preventive care visit? Physical exam Your health care provider will check your:  Height and weight. These may be used to calculate your BMI (body mass index). BMI is a measurement that tells if you are at a healthy weight.  Heart rate and blood pressure.  Body temperature.  Skin for abnormal spots. Counseling Your health care provider may ask you questions about your:  Past medical problems.  Family's medical history.  Alcohol, tobacco, and drug use.  Emotional well-being.  Home life and relationship well-being.  Sexual activity.  Diet, exercise, and sleep habits.  Work and work Statistician.  Access to firearms.  Method of birth control.  Menstrual cycle.  Pregnancy history. What immunizations do I need? Vaccines are usually given at various ages, according to a schedule. Your health care provider will recommend vaccines for you based on your age, medical history, and lifestyle or other factors, such as travel or where you work.   What tests do I need? Blood tests  Lipid and cholesterol levels. These may be checked every 5 years, or more often if you are over 51 years old.  Hepatitis C test.  Hepatitis B test. Screening  Lung cancer screening. You may have this screening every year starting at age 51 if you have a 30-pack-year history of smoking and currently smoke or have quit within the past 15 years.  Colorectal cancer  screening. ? All adults should have this screening starting at age 51 and continuing until age 17. ? Your health care provider may recommend screening at age 49 if you are at increased risk. ? You will have tests every 1-10 years, depending on your results and the type of screening test.  Diabetes screening. ? This is done by checking your blood sugar (glucose) after you have not eaten for a while (fasting). ? You may have this done every 1-3 years.  Mammogram. ? This may be done every 1-2 years. ? Talk with your health care provider about when you should start having regular mammograms. This may depend on whether you have a family history of breast cancer.  BRCA-related cancer screening. This may be done if you have a family history of breast, ovarian, tubal, or peritoneal cancers.  Pelvic exam and Pap test. ? This may be done every 3 years starting at age 51. ? Starting at age 11, this may be done every 5 years if you have a Pap test in combination with an HPV test. Other tests  STD (sexually transmitted disease) testing, if you are at risk.  Bone density scan. This is done to screen for osteoporosis. You may have this scan if you are at high risk for osteoporosis. Talk with your health care provider about your test results, treatment options, and if necessary, the need for more tests. Follow these instructions at home: Eating and drinking  Eat a diet that includes fresh fruits and vegetables, whole grains, lean protein, and low-fat dairy products.  Take vitamin and mineral supplements  as recommended by your health care provider.  Do not drink alcohol if: ? Your health care provider tells you not to drink. ? You are pregnant, may be pregnant, or are planning to become pregnant.  If you drink alcohol: ? Limit how much you have to 0-1 drink a day. ? Be aware of how much alcohol is in your drink. In the [place] one drink equals one 12 oz bottle of beer (355 mL), one 5 oz glass of  wine (148 mL), or one 1 oz glass of hard liquor (44 mL).   Lifestyle  Take daily care of your teeth and gums. Brush your teeth every morning and night with fluoride toothpaste. Floss one time each day.  Stay active. Exercise for at least 30 minutes 5 or more days each week.  Do not use any products that contain nicotine or tobacco, such as cigarettes, e-cigarettes, and chewing tobacco. If you need help quitting, ask your health care provider.  Do not use drugs.  If you are sexually active, practice safe sex. Use a condom or other form of protection to prevent STIs (sexually transmitted infections).  If you do not wish to become pregnant, use a form of birth control. If you plan to become pregnant, see your health care provider for a prepregnancy visit.  If told by your health care provider, take low-dose aspirin daily starting at age 51.  Find healthy ways to cope with stress, such as: ? Meditation, yoga, or listening to music. ? Journaling. ? Talking to a trusted person. ? Spending time with friends and family. Safety  Always wear your seat belt while driving or riding in a vehicle.  Do not drive: ? If you have been drinking alcohol. Do not ride with someone who has been drinking. ? When you are tired or distracted. ? While texting.  Wear a helmet and other protective equipment during sports activities.  If you have firearms in your house, make sure you follow all gun safety procedures. What's next?  Visit your health care provider once a year for an annual wellness visit.  Ask your health care provider how often you should have your eyes and teeth checked.  Stay up to date on all vaccines. This information is not intended to replace advice given to you by your health care provider. Make sure you discuss any questions you have with your health care provider. Document Revised: 03/02/2020 Document Reviewed: 02/07/2018 Elsevier Patient Education  2021 Elsevier Inc.  

## 2020-09-04 ENCOUNTER — Encounter: Payer: Self-pay | Admitting: Physician Assistant

## 2020-09-04 LAB — TSH: TSH: 1.28 u[IU]/mL (ref 0.450–4.500)

## 2020-09-04 LAB — COMPREHENSIVE METABOLIC PANEL
ALT: 10 IU/L (ref 0–32)
AST: 23 IU/L (ref 0–40)
Albumin/Globulin Ratio: 2.1 (ref 1.2–2.2)
Albumin: 4.8 g/dL (ref 3.8–4.8)
Alkaline Phosphatase: 71 IU/L (ref 44–121)
BUN/Creatinine Ratio: 16 (ref 9–23)
BUN: 14 mg/dL (ref 6–24)
Bilirubin Total: 0.7 mg/dL (ref 0.0–1.2)
CO2: 24 mmol/L (ref 20–29)
Calcium: 10.3 mg/dL — ABNORMAL HIGH (ref 8.7–10.2)
Chloride: 99 mmol/L (ref 96–106)
Creatinine, Ser: 0.86 mg/dL (ref 0.57–1.00)
Globulin, Total: 2.3 g/dL (ref 1.5–4.5)
Glucose: 87 mg/dL (ref 65–99)
Potassium: 4.3 mmol/L (ref 3.5–5.2)
Sodium: 137 mmol/L (ref 134–144)
Total Protein: 7.1 g/dL (ref 6.0–8.5)
eGFR: 82 mL/min/{1.73_m2} (ref >59)

## 2020-09-04 LAB — CBC WITH DIFFERENTIAL/PLATELET
Basophils Absolute: 0.1 10*3/uL (ref 0.0–0.2)
Basos: 1 %
EOS (ABSOLUTE): 0 10*3/uL (ref 0.0–0.4)
Eos: 1 %
Hematocrit: 42.3 % (ref 34.0–46.6)
Hemoglobin: 13.8 g/dL (ref 11.1–15.9)
Immature Grans (Abs): 0 10*3/uL (ref 0.0–0.1)
Immature Granulocytes: 0 %
Lymphocytes Absolute: 1.3 10*3/uL (ref 0.7–3.1)
Lymphs: 31 %
MCH: 30.7 pg (ref 26.6–33.0)
MCHC: 32.6 g/dL (ref 31.5–35.7)
MCV: 94 fL (ref 79–97)
Monocytes Absolute: 0.4 10*3/uL (ref 0.1–0.9)
Monocytes: 9 %
Neutrophils Absolute: 2.5 10*3/uL (ref 1.4–7.0)
Neutrophils: 58 %
Platelets: 192 10*3/uL (ref 150–450)
RBC: 4.5 x10E6/uL (ref 3.77–5.28)
RDW: 12.3 % (ref 11.7–15.4)
WBC: 4.2 10*3/uL (ref 3.4–10.8)

## 2020-09-04 LAB — HEMOGLOBIN A1C
Est. average glucose Bld gHb Est-mCnc: 108 mg/dL
Hgb A1c MFr Bld: 5.4 % (ref 4.8–5.6)

## 2020-09-04 LAB — LIPID PANEL
Chol/HDL Ratio: 3.7 ratio (ref 0.0–4.4)
Cholesterol, Total: 226 mg/dL — ABNORMAL HIGH (ref 100–199)
HDL: 61 mg/dL (ref >39)
LDL Chol Calc (NIH): 152 mg/dL — ABNORMAL HIGH (ref 0–99)
Triglycerides: 75 mg/dL (ref 0–149)
VLDL Cholesterol Cal: 13 mg/dL (ref 5–40)

## 2020-09-04 LAB — HEPATITIS C ANTIBODY: Hep C Virus Ab: <0.1 s/co ratio (ref 0.0–0.9)

## 2020-09-06 NOTE — Telephone Encounter (Signed)
Can we see if we can add on CA 27.29 to her labs please?  JB

## 2020-09-07 NOTE — Telephone Encounter (Signed)
Kendra [name],  Can we also add her to the schedule one day that works for her for a tdap please?  Kendra [name]

## 2020-09-08 ENCOUNTER — Telehealth: Payer: Self-pay

## 2020-09-08 NOTE — Telephone Encounter (Signed)
Patient was advised and also seen results on mychart.

## 2020-09-08 NOTE — Telephone Encounter (Signed)
-----   Message from Mar Daring, [place] sent at 09/08/2020  8:45 AM EDT ----- CA 27.29 did trend up slightly but is still normal.

## 2020-09-15 LAB — CANCER ANTIGEN 27.29: CA 27.29: 14.1 U/mL (ref 0.0–38.6)

## 2020-09-15 LAB — SPECIMEN STATUS REPORT

## 2020-09-17 ENCOUNTER — Other Ambulatory Visit: Payer: Self-pay | Admitting: Family Medicine

## 2020-09-17 DIAGNOSIS — M25521 Pain in right elbow: Secondary | ICD-10-CM

## 2020-09-17 NOTE — Addendum Note (Signed)
Addended by: Douglass Rivers T on: 09/17/2020 10:27 AM   Modules accepted: Orders

## 2020-09-20 ENCOUNTER — Ambulatory Visit: Admission: RE | Admit: 2020-09-20 | Payer: 59 | Source: Ambulatory Visit

## 2020-09-20 ENCOUNTER — Ambulatory Visit
Admission: RE | Admit: 2020-09-20 | Discharge: 2020-09-20 | Disposition: A | Discharge status: Home / Self Care | Payer: 59 | Source: Ambulatory Visit | Attending: Family Medicine | Admitting: Family Medicine

## 2020-09-20 ENCOUNTER — Other Ambulatory Visit: Payer: Self-pay

## 2020-09-20 DIAGNOSIS — M25521 Pain in right elbow: Secondary | ICD-10-CM | POA: Diagnosis not present

## 2020-09-20 MED ORDER — IOHEXOL 180 MG/ML  SOLN
10.0000 mL | Freq: Once | INTRAMUSCULAR | Status: AC | PRN
  Administered 2020-09-20: 10 mL

## 2020-09-20 MED ORDER — SODIUM CHLORIDE (PF) 0.9 % IJ SOLN
7.0000 mL | Freq: Once | INTRAMUSCULAR | Status: AC
  Administered 2020-09-20: 7 mL via INTRAVENOUS

## 2020-09-20 MED ORDER — LIDOCAINE HCL (PF) 1 % IJ SOLN
10.0000 mL | Freq: Once | INTRAMUSCULAR | Status: AC
  Administered 2020-09-20: 10 mL

## 2020-09-20 MED ORDER — GADOBUTROL 1 MMOL/ML IV SOLN
0.0500 mL | Freq: Once | INTRAVENOUS | Status: AC | PRN
  Administered 2020-09-20: 0.05 mL

## 2020-09-21 ENCOUNTER — Encounter: Payer: Self-pay | Admitting: Family Medicine

## 2020-09-29 ENCOUNTER — Ambulatory Visit
Admission: RE | Admit: 2020-09-29 | Discharge: 2020-09-29 | Disposition: A | Discharge status: Home / Self Care | Payer: 59 | Source: Ambulatory Visit | Attending: Physician Assistant | Admitting: Physician Assistant

## 2020-09-29 ENCOUNTER — Other Ambulatory Visit: Payer: Self-pay

## 2020-09-29 DIAGNOSIS — Z1231 Encounter for screening mammogram for malignant neoplasm of breast: Secondary | ICD-10-CM | POA: Diagnosis not present

## 2020-09-29 DIAGNOSIS — Z1239 Encounter for other screening for malignant neoplasm of breast: Secondary | ICD-10-CM

## 2020-09-29 DIAGNOSIS — Z853 Personal history of malignant neoplasm of breast: Secondary | ICD-10-CM | POA: Insufficient documentation

## 2020-09-29 DIAGNOSIS — M810 Age-related osteoporosis without current pathological fracture: Secondary | ICD-10-CM | POA: Insufficient documentation

## 2020-09-30 ENCOUNTER — Ambulatory Visit: Payer: 59

## 2020-09-30 ENCOUNTER — Other Ambulatory Visit: Payer: 59

## 2020-10-01 ENCOUNTER — Other Ambulatory Visit: Payer: Self-pay

## 2020-10-01 MED FILL — Gabapentin Cap 100 MG: ORAL | 90 days supply | Qty: 180 | Fill #0 | Status: CN

## 2020-10-04 ENCOUNTER — Other Ambulatory Visit: Payer: Self-pay

## 2020-10-06 NOTE — Progress Notes (Signed)
[facility] [address] [place] Phone: [phone] Subjective:   I Kendra [name] am serving as a Education administrator for Dr. Hulan Saas.  This visit occurred during the SARS-CoV-2 public health emergency.  Safety protocols were in place, including screening questions prior to the visit, additional usage of staff PPE, and extensive cleaning of exam room while observing appropriate contact time as indicated for disinfecting solutions.   I'm seeing this patient by the request  of:  Virginia Crews, MD  CC: back and neck pain   [id number]   09/02/2020 Patient has not been treated for 12 weeks.  Patient has started physical therapy, home exercises, oral anti-inflammatories short course, icing regimen, knee bracing with no significant benefit.  Patient is having limited range of motion likely more secondary to the pain.  Do believe at this point an MR arthrogram is necessary to evaluate the ulnar collateral ligament to see if this is contributing to why patient does not seem to be improving.  If intact and still just more of partial tearing of the common flexor tendon then PRP is a possibility.  Patient will follow-up after imaging to discuss.  Update 10/07/2020 Kendra [name] is a 50 y.o. female coming in with complaint of back and neck pain. R elbow pain. OMT 09/02/2020. Patient states she had her MRI for her elbow. TTP. States it feels the same and she is still having pain. Neck and back is doing well.   Medications patient has been prescribed:  MRI elbow unremarkable this was independently visualized by me today.       As well as notes that were available from care everywhere and other healthcare systems.  Past medical history, social, surgical and family history all reviewed in electronic medical record.  No pertanent information unless stated regarding to the chief complaint.   Past Medical History:  Diagnosis Date  . Allergy   . Breast  cancer ([place]) left   2004 with chemo tx  . Osteoporosis   . Personal history of chemotherapy     No Known Allergies   Review of Systems:  No headache, visual changes, nausea, vomiting, diarrhea, constipation, dizziness, abdominal pain, skin rash, fevers, chills, night sweats, weight loss, swollen lymph nodes, body aches, joint swelling, chest pain, shortness of breath, mood changes. POSITIVE muscle aches  Objective  Blood pressure 110/72, pulse 68, height 5' 7.5" (1.715 m), weight 153 lb (69.4 kg), SpO2 98 %.   General: No apparent distress alert and oriented x3 mood and affect normal, dressed appropriately.  HEENT: Pupils equal, extraocular movements intact  Respiratory: Patient's speak in full sentences and does not appear short of breath  Cardiovascular: No lower extremity edema, non tender, no erythema  Gait normal with good balance and coordination.  MSK:  Non tender with full range of motion and good stability and symmetric strength and tone of shoulders,  wrist, hip, knee and ankles bilaterally.  Back -low back exam does have some tenderness more over the right gluteal tendon.  Still some mild pain over the greater trochanteric area.  Mild pain over the right sacroiliac joint.  Negative straight leg test noted today.  Mild tightness with FABER test.  Lacks last 5 degrees of extension of the back. Right elbow exam still has some tenderness to palpation over the medial epicondylar region.  Mild positive Tinel's test.  Patient does have near full range of motion of the elbow noted.  Good grip strength.  Osteopathic findings  C6 flexed rotated and side bent left T3 extended rotated and side bent right inhaled rib T8 extended rotated and side bent left L2 flexed rotated and side bent right Sacrum right on right  Limited musculoskeletal ultrasound was performed and interpreted by Lyndal Pulley  Limited ultrasound of patient's right elbow on the medial epicondylar region shows the  patient does have scar tissue formation noted in the common flexor tendon.  No acute tear is appreciated.  Patient has no increase in neovascularization noted.  No cortical irregularity noted. Impression: Normal flexor common tendon     Assessment and Plan:  SI (sacroiliac) joint dysfunction Chronic, with mild exacerbation.  Continuing to try to be relatively active.  Patient does workout on a normal basis.  Minimal radicular symptoms.  Continuing to take gabapentin fairly regularly.  Discussed icing regimen and home exercises.  Discussed which activities to do which wants to avoid.  Follow-up again 6 weeks  Medial epicondylitis of elbow, right Patient is overall on MRI and does not show any significant tendinitis noted or any intrasubstance tearing.  Patient's UCL is unremarkable.  Patient continues to have pain at this point.  Discussed with patient.  Discussed that differential includes a possible ulnar neuropathy.  Will do more of the nerve stripping at this time, compression, we discussed increasing the gabapentin and increase her to 200 mg 3 times a dayDiscussed icing regimen.  Continue the vitamin D supplementation.  Follow-up with me again in 6 weeks  OP (osteoporosis) Discussed with patient.  Most recent bone density shows somewhat worsening from a 2.4-2.9.  Discussed with her about the potential for referral to endocrinology.  Patient will check with primary care and then they will discuss as appropriate.  Patient is in agreement with the plan.  Follow-up again in 6 weeks    Nonallopathic problems  Decision today to treat with OMT was based on Physical Exam  After verbal consent patient was treated with HVLA, ME, FPR techniques in cervical, rib, thoracic, lumbar, and sacral  areas  Patient tolerated the procedure well with improvement in symptoms  Patient given exercises, stretches and lifestyle modifications  See medications in patient instructions if given  Patient will  follow up in 4-8 weeks      The above documentation has been reviewed and is accurate and complete Lyndal Pulley, DO       Note: This dictation was prepared with Dragon dictation along with smaller phrase technology. Any transcriptional errors that result from this process are unintentional.

## 2020-10-07 ENCOUNTER — Encounter: Payer: Self-pay | Admitting: Family Medicine

## 2020-10-07 ENCOUNTER — Ambulatory Visit (INDEPENDENT_AMBULATORY_CARE_PROVIDER_SITE_OTHER): Payer: 59 | Admitting: Family Medicine

## 2020-10-07 ENCOUNTER — Other Ambulatory Visit: Payer: Self-pay

## 2020-10-07 ENCOUNTER — Ambulatory Visit: Payer: Self-pay

## 2020-10-07 VITALS — BP 110/72 | HR 68 | Ht 67.5 in | Wt 153.0 lb

## 2020-10-07 DIAGNOSIS — M25521 Pain in right elbow: Secondary | ICD-10-CM | POA: Diagnosis not present

## 2020-10-07 DIAGNOSIS — M7701 Medial epicondylitis, right elbow: Secondary | ICD-10-CM

## 2020-10-07 DIAGNOSIS — M810 Age-related osteoporosis without current pathological fracture: Secondary | ICD-10-CM | POA: Diagnosis not present

## 2020-10-07 DIAGNOSIS — M533 Sacrococcygeal disorders, not elsewhere classified: Secondary | ICD-10-CM

## 2020-10-07 DIAGNOSIS — M9903 Segmental and somatic dysfunction of lumbar region: Secondary | ICD-10-CM

## 2020-10-07 DIAGNOSIS — M9902 Segmental and somatic dysfunction of thoracic region: Secondary | ICD-10-CM

## 2020-10-07 DIAGNOSIS — M9908 Segmental and somatic dysfunction of rib cage: Secondary | ICD-10-CM | POA: Diagnosis not present

## 2020-10-07 DIAGNOSIS — M9904 Segmental and somatic dysfunction of sacral region: Secondary | ICD-10-CM | POA: Diagnosis not present

## 2020-10-07 DIAGNOSIS — M9901 Segmental and somatic dysfunction of cervical region: Secondary | ICD-10-CM

## 2020-10-07 MED ORDER — GABAPENTIN 100 MG PO CAPS
200.0000 mg | ORAL_CAPSULE | Freq: Three times a day (TID) | ORAL | 3 refills | Status: DC
  Filled 2020-10-07 – 2020-10-08 (×2): qty 180, 30d supply, fill #0
  Filled 2020-12-27: qty 180, 30d supply, fill #1

## 2020-10-07 NOTE — Patient Instructions (Addendum)
Good to see you Increase gabapentin 200 mg 3 times a day Wear compression sleeve with working out Exercise 3 times a week Decrease range of motion with lifting Talk to primary consider endocrinology referral  Double calcium intake See me again in 6 weeks

## 2020-10-07 NOTE — Assessment & Plan Note (Signed)
Discussed with patient.  Most recent bone density shows somewhat worsening from a 2.4-2.9.  Discussed with her about the potential for referral to endocrinology.  Patient will check with primary care and then they will discuss as appropriate.  Patient is in agreement with the plan.  Follow-up again in 6 weeks

## 2020-10-07 NOTE — Telephone Encounter (Signed)
Can we use the 9:00 tomorrow morning that we opened for a virtual visit with her to discuss this medication

## 2020-10-07 NOTE — Assessment & Plan Note (Signed)
Chronic, with mild exacerbation.  Continuing to try to be relatively active.  Patient does workout on a normal basis.  Minimal radicular symptoms.  Continuing to take gabapentin fairly regularly.  Discussed icing regimen and home exercises.  Discussed which activities to do which wants to avoid.  Follow-up again 6 weeks

## 2020-10-07 NOTE — Assessment & Plan Note (Signed)
Patient is overall on MRI and does not show any significant tendinitis noted or any intrasubstance tearing.  Patient's UCL is unremarkable.  Patient continues to have pain at this point.  Discussed with patient.  Discussed that differential includes a possible ulnar neuropathy.  Will do more of the nerve stripping at this time, compression, we discussed increasing the gabapentin and increase her to 200 mg 3 times a dayDiscussed icing regimen.  Continue the vitamin D supplementation.  Follow-up with me again in 6 weeks

## 2020-10-08 ENCOUNTER — Other Ambulatory Visit: Payer: Self-pay
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
## 2020-10-11 ENCOUNTER — Encounter: Payer: Self-pay | Admitting: Family Medicine

## 2020-10-11 ENCOUNTER — Other Ambulatory Visit: Payer: Self-pay

## 2020-10-11 ENCOUNTER — Telehealth: Payer: 59 | Admitting: Family Medicine

## 2020-10-11 DIAGNOSIS — M818 Other osteoporosis without current pathological fracture: Secondary | ICD-10-CM | POA: Diagnosis not present

## 2020-10-11 DIAGNOSIS — Z1211 Encounter for screening for malignant neoplasm of colon: Secondary | ICD-10-CM

## 2020-10-11 MED ORDER — ALENDRONATE SODIUM 70 MG PO TABS
70.0000 mg | ORAL_TABLET | ORAL | 11 refills | Status: DC
  Filled 2020-10-11: qty 4, 28d supply, fill #0
  Filled 2020-11-03: qty 4, 28d supply, fill #1
  Filled 2020-12-06: qty 4, 28d supply, fill #2
  Filled 2020-12-30: qty 4, 28d supply, fill #3
  Filled 2021-01-31: qty 4, 28d supply, fill #4
  Filled 2021-02-28: qty 4, 28d supply, fill #5
  Filled 2021-04-06: qty 4, 28d supply, fill #6
  Filled 2021-05-03: qty 4, 28d supply, fill #7
  Filled 2021-05-31: qty 4, 28d supply, fill #8
  Filled 2021-06-27: qty 4, 28d supply, fill #9
  Filled 2021-08-01: qty 4, 28d supply, fill #10
  Filled 2021-08-31: qty 4, 28d supply, fill #11

## 2020-10-11 NOTE — Patient Instructions (Signed)
Alendronate Weekly Oral Tablets What is this medicine? ALENDRONATE (a LEN droe nate) slows calcium loss from bones. It treats osteoporosis. It may be used in other people at risk for bone loss. This medicine may be used for other purposes; ask your health care provider or pharmacist if you have questions. COMMON BRAND NAME(S): Fosamax What should I tell my health care provider before I take this medicine? They need to know if you have any of these conditions:  bleeding disorder  cancer  dental disease  difficulty swallowing  infection (fever, chills, cough, sore throat, pain or trouble passing urine)  kidney disease  low levels of calcium or other minerals in the blood  low red blood cell counts  receiving steroids like dexamethasone or prednisone  stomach or intestine problems  trouble sitting or standing for 30 minutes  an unusual or allergic reaction to alendronate, other drugs, foods, dyes or preservatives  pregnant or trying to get pregnant  breast-feeding How should I use this medicine? Take this drug by mouth with a full glass of water. Take it as directed on the prescription label at the same day of each week. Take the dose right after waking up. Do not eat or drink anything before taking it. Do not take it with any other drink except water. Do not chew or crush the tablet. After taking it, do not eat breakfast, drink, or take any other drugs or vitamins for at least 30 minutes. Sit or stand up for at least 30 minutes after you take it. Do not lie down. Keep taking it unless your health care provider tells you to stop. A special MedGuide will be given to you by the pharmacist with each prescription and refill. Be sure to read this information carefully each time. Talk to your health care provider about the use of this drug in children. Special care may be needed. Overdosage: If you think you have taken too much of this medicine contact a poison control center or  emergency room at once. NOTE: This medicine is only for you. Do not share this medicine with others. What if I miss a dose? If you take your drug once a day, skip it. Take your next dose at the scheduled time the next morning. Do not take two doses on the same day. If you take your drug once a week, take the missed dose on the morning after you remember. Do not take two doses on the same day. What may interact with this medicine?  aluminum hydroxide  antacids  aspirin  calcium supplements  drugs for inflammation like ibuprofen, naproxen, and others  iron supplements  magnesium supplements  vitamins with minerals This list may not describe all possible interactions. Give your health care provider a list of all the medicines, herbs, non-prescription drugs, or dietary supplements you use. Also tell them if you smoke, drink alcohol, or use illegal drugs. Some items may interact with your medicine. What should I watch for while using this medicine? Visit your health care provider for regular checks on your progress. It may be some time before you see the benefit from this drug. Some people who take this drug have severe bone, joint, or muscle pain. This drug may also increase your risk for jaw problems or a broken thigh bone. Tell your health care provider right away if you have severe pain in your jaw, bones, joints, or muscles. Tell you health care provider if you have any pain that does not go away  or that gets worse. Tell your dentist and dental surgeon that you are taking this drug. You should not have major dental surgery while on this drug. See your dentist to have a dental exam and fix any dental problems before starting this drug. Take good care of your teeth while on this drug. Make sure you see your dentist for regular follow-up appointments. You should make sure you get enough calcium and vitamin D while you are taking this drug. Discuss the foods you eat and the vitamins you take  with your health care provider. You may need blood work done while you are taking this drug. What side effects may I notice from receiving this medicine? Side effects that you should report to your doctor or health care provider as soon as possible:  allergic reactions (skin rash, itching or hives; swelling of the face, lips, or tongue)  bone pain  heartburn (burning feeling in chest, often after eating or when lying down)  jaw pain, especially after dental work  joint pain  low calcium levels (fast heartbeat; muscle cramps or pain; pain, tingling, or numbness in the hands or feet; seizures)  muscle pain  painful or difficulty swallowing  redness, blistering, peeling, or loosening of the skin, including inside the mouth  stomach bleeding (bloody or black, tarry stools; spitting up blood or [place] material that looks like coffee grounds) Side effects that usually do not require medical attention (report to your doctor or health care provider if they continue or are bothersome):  changes in taste  constipation  diarrhea  nausea This list may not describe all possible side effects. Call your doctor for medical advice about side effects. You may report side effects to FDA at 1-800-FDA-1088. Where should I keep my medicine? Keep out of the reach of children and pets. Store at room temperature between 15 and 30 degrees C (59 and 86 degrees F). Throw away any unused drug after the expiration date. NOTE: This sheet is a summary. It may not cover all possible information. If you have questions about this medicine, talk to your doctor, pharmacist, or health care provider.  2021 Elsevier/Gold Standard (2019-04-17 10:39:05)

## 2020-10-11 NOTE — Assessment & Plan Note (Signed)
Worsening over last several years Likely 2/2 early iatrogenic menopause Discussed treatment options Continue vitamin D and calcium supplementation at current dose Continue weightbearing exercise Start Fosamax weekly Discussed how to take the medication and possible side effects Repeat DEXA in 2 years Consider drug holiday in 5 years

## 2020-10-19 ENCOUNTER — Other Ambulatory Visit: Payer: Self-pay

## 2020-10-19 MED ORDER — CARESTART COVID-19 HOME TEST VI KIT
PACK | 0 refills | Status: DC
  Filled 2020-10-19: qty 4, 4d supply, fill #0

## 2020-11-03 ENCOUNTER — Other Ambulatory Visit: Payer: Self-pay

## 2020-11-14 ENCOUNTER — Ambulatory Visit
Admission: EM | Admit: 2020-11-14 | Discharge: 2020-11-14 | Disposition: A | Discharge status: Home / Self Care | Payer: 59 | Attending: Family Medicine | Admitting: Family Medicine

## 2020-11-14 ENCOUNTER — Encounter: Payer: Self-pay | Admitting: Emergency Medicine

## 2020-11-14 ENCOUNTER — Other Ambulatory Visit: Payer: Self-pay

## 2020-11-14 ENCOUNTER — Ambulatory Visit (INDEPENDENT_AMBULATORY_CARE_PROVIDER_SITE_OTHER): Payer: 59

## 2020-11-14 DIAGNOSIS — M79645 Pain in left finger(s): Secondary | ICD-10-CM | POA: Diagnosis not present

## 2020-11-14 DIAGNOSIS — S62647A Nondisplaced fracture of proximal phalanx of left little finger, initial encounter for closed fracture: Secondary | ICD-10-CM | POA: Diagnosis not present

## 2020-11-14 DIAGNOSIS — S62617A Displaced fracture of proximal phalanx of left little finger, initial encounter for closed fracture: Secondary | ICD-10-CM | POA: Diagnosis not present

## 2020-11-14 MED ORDER — MELOXICAM 15 MG PO TABS: 15.0000 mg | ORAL_TABLET | Freq: Every day | ORAL | 0 refills | Status: DC | PRN
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
## 2020-11-14 NOTE — Discharge Instructions (Signed)
Medication as directed.  Call [facility] [phone]) for an appt.  Take care  Dr. Lacinda Axon
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
## 2020-11-16 ENCOUNTER — Telehealth (INDEPENDENT_AMBULATORY_CARE_PROVIDER_SITE_OTHER): Payer: Self-pay | Admitting: Gastroenterology

## 2020-11-16 ENCOUNTER — Other Ambulatory Visit: Payer: Self-pay

## 2020-11-16 ENCOUNTER — Encounter: Payer: Self-pay | Admitting: Family Medicine

## 2020-11-16 DIAGNOSIS — Z1211 Encounter for screening for malignant neoplasm of colon: Secondary | ICD-10-CM

## 2020-11-16 DIAGNOSIS — S62617A Displaced fracture of proximal phalanx of left little finger, initial encounter for closed fracture: Secondary | ICD-10-CM | POA: Diagnosis not present

## 2020-11-16 MED ORDER — NA SULFATE-K SULFATE-MG SULF 17.5-3.13-1.6 GM/177ML PO SOLN
1.0000 | Freq: Once | ORAL | 0 refills | Status: AC
  Filled 2020-11-16: qty 354, 1d supply, fill #0

## 2020-11-16 NOTE — Progress Notes (Signed)
  Gastroenterology Pre-Procedure Review  Request Date: 12/31/20 Requesting Physician: Dr. Vicente Males  PATIENT REVIEW QUESTIONS: The patient responded to the following health history questions as indicated:    1. Are you having any GI issues? no 2. Do you have a personal history of Polyps? no 3. Do you have a family history of Colon Cancer or Polyps? no 4. Diabetes Mellitus? no 5. Joint replacements in the past 12 months?no 6. Major health problems in the past 3 months?no 7. Any artificial heart valves, MVP, or defibrillator?no    MEDICATIONS & ALLERGIES:    Patient reports the following regarding taking any anticoagulation/antiplatelet therapy:   Plavix, Coumadin, Eliquis, Xarelto, Lovenox, Pradaxa, Brilinta, or Effient? no Aspirin? no  Patient confirms/reports the following medications:  Current Outpatient Medications  Medication Sig Dispense Refill  . alendronate (FOSAMAX) 70 MG tablet Take 1 tablet (70 mg total) by mouth every 7 (seven) days. Take with a full glass of water on an empty stomach. 4 tablet 11  . Calcium-Vitamin D 600-200 MG-UNIT per tablet Take 1 tablet by mouth daily.     Marland Kitchen COVID-19 At Home Antigen Test [facility] COVID-19 HOME TEST) KIT Use as directed (Patient taking differently: Use as directed) 4 each 0  . cyclobenzaprine (FLEXERIL) 10 MG tablet Take 1 tablet (10 mg total) by mouth 3 (three) times daily as needed for muscle spasms. 90 tablet 1  . fluticasone (FLONASE) 50 MCG/ACT nasal spray PLACE 1 SPRAY INTO BOTH NOSTRILS DAILY. 16 g 11  . gabapentin (NEURONTIN) 100 MG capsule Take 2 capsules (200 mg total) by mouth 3 (three) times daily. 180 capsule 3  . loratadine-pseudoephedrine (CLARITIN-D 24 HOUR) 10-240 MG 24 hr tablet Take 1 tablet by mouth daily. 90 tablet 1  . meloxicam (MOBIC) 15 MG tablet Take 1 tablet (15 mg total) by mouth daily as needed for pain. 30 tablet 0  . Na Sulfate-K Sulfate-Mg Sulf 17.5-3.13-1.6 GM/177ML SOLN Take 1 kit by mouth once for 1 dose.  354 mL 0  . nitroGLYCERIN (NITRODUR - DOSED IN MG/24 HR) 0.2 mg/hr patch 1/4 patch daily 30 patch 1  . valACYclovir (VALTREX) 1000 MG tablet Take 2 tablets (2,000 mg total) by mouth 2 (two) times daily. At onset of cold sore 20 tablet 6   No current facility-administered medications for this visit.    Patient confirms/reports the following allergies:  No Known Allergies  Orders Placed This Encounter  Procedures  . Procedural/ Surgical Case Request: COLONOSCOPY WITH PROPOFOL    Standing Status:   Standing    Number of Occurrences:   1    Order Specific Question:   Pre-op diagnosis    Answer:   Colon cancer screening Z12.11    Order Specific Question:   CPT Code    Answer:   53976    AUTHORIZATION INFORMATION Primary Insurance: 1D#: Group #:  Secondary Insurance: 1D#: Group #:  SCHEDULE INFORMATION: Date: 12/31/20 Time: Location: [place]

## 2020-11-18 ENCOUNTER — Other Ambulatory Visit: Payer: Self-pay

## 2020-11-18 MED ORDER — IBUPROFEN 600 MG PO TABS
ORAL_TABLET | ORAL | 2 refills | Status: DC
  Filled 2020-11-18: qty 60, 20d supply, fill #0
  Filled 2021-04-25: qty 60, 20d supply, fill #1
  Filled 2021-06-27: qty 60, 20d supply, fill #2

## 2020-11-18 MED ORDER — OXYCODONE HCL 5 MG PO TABS
ORAL_TABLET | ORAL | 0 refills | Status: DC
  Filled 2020-11-18: qty 30, 5d supply, fill #0

## 2020-11-18 MED ORDER — ONDANSETRON 4 MG PO TBDP
ORAL_TABLET | ORAL | 0 refills | Status: DC
  Filled 2020-11-18: qty 8, 4d supply, fill #0

## 2020-11-19 ENCOUNTER — Ambulatory Visit: Payer: 59 | Admitting: Family Medicine

## 2020-11-19 DIAGNOSIS — M79642 Pain in left hand: Secondary | ICD-10-CM | POA: Diagnosis not present

## 2020-11-19 DIAGNOSIS — M810 Age-related osteoporosis without current pathological fracture: Secondary | ICD-10-CM | POA: Diagnosis not present

## 2020-11-19 DIAGNOSIS — Z8349 Family history of other endocrine, nutritional and metabolic diseases: Secondary | ICD-10-CM | POA: Diagnosis not present

## 2020-11-19 DIAGNOSIS — Z803 Family history of malignant neoplasm of breast: Secondary | ICD-10-CM | POA: Diagnosis not present

## 2020-11-19 DIAGNOSIS — Z8261 Family history of arthritis: Secondary | ICD-10-CM | POA: Diagnosis not present

## 2020-11-19 DIAGNOSIS — S62617A Displaced fracture of proximal phalanx of left little finger, initial encounter for closed fracture: Secondary | ICD-10-CM | POA: Diagnosis not present

## 2020-11-19 DIAGNOSIS — Z79899 Other long term (current) drug therapy: Secondary | ICD-10-CM | POA: Diagnosis not present

## 2020-11-19 DIAGNOSIS — G8918 Other acute postprocedural pain: Secondary | ICD-10-CM | POA: Diagnosis not present

## 2020-11-19 DIAGNOSIS — Z9221 Personal history of antineoplastic chemotherapy: Secondary | ICD-10-CM | POA: Diagnosis not present

## 2020-11-19 DIAGNOSIS — Z9011 Acquired absence of right breast and nipple: Secondary | ICD-10-CM | POA: Diagnosis not present

## 2020-11-19 DIAGNOSIS — Z853 Personal history of malignant neoplasm of breast: Secondary | ICD-10-CM | POA: Diagnosis not present

## 2020-11-25 DIAGNOSIS — S62617D Displaced fracture of proximal phalanx of left little finger, subsequent encounter for fracture with routine healing: Secondary | ICD-10-CM | POA: Diagnosis not present

## 2020-12-02 DIAGNOSIS — S62617D Displaced fracture of proximal phalanx of left little finger, subsequent encounter for fracture with routine healing: Secondary | ICD-10-CM | POA: Diagnosis not present

## 2020-12-06 ENCOUNTER — Other Ambulatory Visit: Payer: Self-pay

## 2020-12-06 ENCOUNTER — Encounter: Payer: Self-pay | Admitting: Emergency Medicine

## 2020-12-06 ENCOUNTER — Ambulatory Visit
Admission: EM | Admit: 2020-12-06 | Discharge: 2020-12-06 | Disposition: A | Discharge status: Home / Self Care | Payer: 59 | Attending: Emergency Medicine | Admitting: Emergency Medicine

## 2020-12-06 ENCOUNTER — Ambulatory Visit: Payer: Self-pay | Admitting: *Deleted

## 2020-12-06 ENCOUNTER — Encounter: Payer: Self-pay | Admitting: Family Medicine

## 2020-12-06 DIAGNOSIS — N390 Urinary tract infection, site not specified: Secondary | ICD-10-CM | POA: Diagnosis not present

## 2020-12-06 LAB — POCT URINALYSIS DIP (MANUAL ENTRY)
Bilirubin, UA: NEGATIVE
Glucose, UA: 100 mg/dL — AB
Ketones, POC UA: NEGATIVE mg/dL
Nitrite, UA: POSITIVE — AB
Protein Ur, POC: NEGATIVE mg/dL
Spec Grav, UA: <=1.005 — AB (ref 1.010–1.025)
Urobilinogen, UA: 1 E.U./dL
pH, UA: 5 (ref 5.0–8.0)

## 2020-12-06 MED ORDER — CEPHALEXIN 500 MG PO CAPS
500.0000 mg | ORAL_CAPSULE | Freq: Two times a day (BID) | ORAL | 0 refills | Status: AC
  Filled 2020-12-06: qty 10, 5d supply, fill #0

## 2020-12-06 NOTE — Telephone Encounter (Signed)
Reason for Disposition  Urinating more frequently than usual (i.e., frequency)  Answer Assessment - Initial Assessment Questions 1. SYMPTOM: "What's the main symptom you're concerned about?" (e.g., frequency, incontinence)     Urinary frequency and pain in pelvic area and low back 2. ONSET: "When did the symptoms   start?"     This am  3. PAIN: "Is there any pain?" If Yes, ask: "How bad is it?" (Scale: 1-10; mild, moderate, severe)     Yes low pelvic area and low back pain  4. CAUSE: "What do you think is causing the symptoms?"     UTI  5. OTHER SYMPTOMS: "Do you have any other symptoms?" (e.g., fever, flank pain, blood in urine, pain with urination)     After urinating feels pain in pelvic area  6. PREGNANCY: "Is there any chance you are pregnant?" "When was your last menstrual period?"     na  Protocols used: Urinary Symptoms-A-AH

## 2020-12-06 NOTE — Telephone Encounter (Signed)
Patient c/o urinary frequency and pain in pelvic are after urinating . Symptoms started this am and are progressing . Patient took [place] which helped short term and now is having pain in low pelvic area and low back pain. Denies fever, retention, blood in urine or foul odor. Patient reports she had similar symptoms in Feb. Patient requesting to drop off sample of urine if necessary. Patient at work at this time. Instructed patient to go to UC due to no available appt. Care advise given. Patient verbalized understanding of care advise and to go to [facility] or ED for worsening symptoms. Patient would like to see if PCP will write prescription prior to going to UC due to being at work.
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
## 2020-12-06 NOTE — ED Triage Notes (Signed)
Pt presents today with c/o urine frequency and hesitancy that began last evening. Denies fever. She has been using OTC UTI meds with minimal relief.

## 2020-12-06 NOTE — Discharge Instructions (Addendum)
Take the antibiotic as directed.  The urine culture is pending.  We will call you if it shows the need to change or discontinue your antibiotic.    Follow up with your primary care provider if your symptoms are not improving.    

## 2020-12-08 LAB — URINE CULTURE: Culture: <10000 — AB

## 2020-12-09 DIAGNOSIS — S62617D Displaced fracture of proximal phalanx of left little finger, subsequent encounter for fracture with routine healing: Secondary | ICD-10-CM | POA: Diagnosis not present

## 2020-12-09 DIAGNOSIS — S62617A Displaced fracture of proximal phalanx of left little finger, initial encounter for closed fracture: Secondary | ICD-10-CM | POA: Diagnosis not present

## 2020-12-14 DIAGNOSIS — S62617D Displaced fracture of proximal phalanx of left little finger, subsequent encounter for fracture with routine healing: Secondary | ICD-10-CM | POA: Diagnosis not present

## 2020-12-15 ENCOUNTER — Encounter: Payer: Self-pay | Admitting: Family Medicine

## 2020-12-15 ENCOUNTER — Other Ambulatory Visit: Payer: Self-pay

## 2020-12-15 ENCOUNTER — Ambulatory Visit (INDEPENDENT_AMBULATORY_CARE_PROVIDER_SITE_OTHER): Payer: 59 | Admitting: Family Medicine

## 2020-12-15 VITALS — BP 100/70 | HR 58 | Ht 67.5 in | Wt 155.0 lb

## 2020-12-15 DIAGNOSIS — M9904 Segmental and somatic dysfunction of sacral region: Secondary | ICD-10-CM

## 2020-12-15 DIAGNOSIS — M9908 Segmental and somatic dysfunction of rib cage: Secondary | ICD-10-CM

## 2020-12-15 DIAGNOSIS — M9902 Segmental and somatic dysfunction of thoracic region: Secondary | ICD-10-CM | POA: Diagnosis not present

## 2020-12-15 DIAGNOSIS — M9903 Segmental and somatic dysfunction of lumbar region: Secondary | ICD-10-CM | POA: Diagnosis not present

## 2020-12-15 DIAGNOSIS — M533 Sacrococcygeal disorders, not elsewhere classified: Secondary | ICD-10-CM | POA: Diagnosis not present

## 2020-12-15 DIAGNOSIS — M9901 Segmental and somatic dysfunction of cervical region: Secondary | ICD-10-CM

## 2020-12-15 NOTE — Patient Instructions (Signed)
Good to see you No injuries until I see you again See me again in 5 weeks

## 2020-12-15 NOTE — Progress Notes (Signed)
[place] [address] [place] Phone: [phone] Subjective:   I [name] am serving as a Education administrator for Dr. Hulan Saas.  This visit occurred during the SARS-CoV-2 public health emergency.  Safety protocols were in place, including screening questions prior to the visit, additional usage of staff PPE, and extensive cleaning of exam room while observing appropriate contact time as indicated for disinfecting solutions.   I'm seeing this patient by the request  of:  Bacigalupo, Dionne Bucy, MD  CC: Back pain, neck pain and hip pain follow-up  [id number]  Kendra [name] is a 50 y.o. female coming in with complaint of back and neck pain Patient states her neck is good. Her back is painful due to surgery on her hand and missing her last appointment. Right elbow gabapentin is helping.  Patient states that it is still not 100% though.  Patient does unfortunately have been using her arm more secondary to the surgery on her hand.  Medications patient has been prescribed:   Taking:     Patient is scheduled for colonoscopy July 22. Recently seen in urgent care for urinary tract injection as well. Patient since last visit also had a fracture of the fifth finger from a fall.  Patient unfortunately did have to have surgery for it.  Surgery was at the beginning of June.   Reviewed prior external information including notes and imaging from previsou exam, outside providers and external EMR if available.   As well as notes that were available from care everywhere and other healthcare systems.  Past medical history, social, surgical and family history all reviewed in electronic medical record.  No pertanent information unless stated regarding to the chief complaint.   Past Medical History:  Diagnosis Date   Allergy    Breast cancer ([place]) left   2004 with chemo tx   Osteoporosis    Personal history of chemotherapy     No Known  Allergies   Review of Systems:  No headache, visual changes, nausea, vomiting, diarrhea, constipation, dizziness, abdominal pain, skin rash, fevers, chills, night sweats, weight loss, swollen lymph nodes, body aches, joint swelling, chest pain, shortness of breath, mood changes. POSITIVE muscle aches  Objective  Blood pressure 100/70, pulse (!) 58, height 5' 7.5" (1.715 m), weight 155 lb (70.3 kg), SpO2 99 %.   General: No apparent distress alert and oriented x3 mood and affect normal, dressed appropriately.  HEENT: Pupils equal, extraocular movements intact  Respiratory: Patient's speak in full sentences and does not appear short of breath  Cardiovascular: No lower extremity edema, non tender, no erythema  Patient is wearing a brace on her left hand at this moment. Patient is tender to palpation more in the lumbar spine.  Tightness with straight leg test.  Does have some decrease in sidebending of the neck bilaterally. Right elbow exam still has tenderness more over the medial epicondylar region.  Patient does have a mild positive Tinel's noted.   Osteopathic findings  C 7 flexed rotated and side bent left T3 extended rotated and side bent right inhaled rib T9 extended rotated and side bent left L2 flexed rotated and side bent right Sacrum right on right       Assessment and Plan:  SI (sacroiliac) joint dysfunction Mild increase in tightness and exacerbation of chronic problem secondary to patient not been quite as active with patient's most recent injury and needing surgery.  Do believe when patient starts to increase  activity again she will do relatively well.  Continue the gabapentin at the low dose for the neuropathy it appears somewhere on the right upper extremity.  Discussed the potential for nerve conduction study which patient declined.  Patient does have meloxicam as well to use as needed.  Follow-up with me again 6 weeks   Nonallopathic problems  Decision today to  treat with OMT was based on Physical Exam  After verbal consent patient was treated with HVLA, ME, FPR techniques in cervical, rib, thoracic, lumbar, and sacral  areas  Patient tolerated the procedure well with improvement in symptoms  Patient given exercises, stretches and lifestyle modifications  See medications in patient instructions if given  Patient will follow up in 4-8 weeks      The above documentation has been reviewed and is accurate and complete Lyndal Pulley, DO       Note: This dictation was prepared with Dragon dictation along with smaller phrase technology. Any transcriptional errors that result from this process are unintentional.

## 2020-12-16 ENCOUNTER — Encounter: Payer: Self-pay | Admitting: Family Medicine

## 2020-12-16 NOTE — Assessment & Plan Note (Signed)
Mild increase in tightness and exacerbation of chronic problem secondary to patient not been quite as active with patient's most recent injury and needing surgery.  Do believe when patient starts to increase activity again she will do relatively well.  Continue the gabapentin at the low dose for the neuropathy it appears somewhere on the right upper extremity.  Discussed the potential for nerve conduction study which patient declined.  Patient does have meloxicam as well to use as needed.  Follow-up with me again 6 weeks

## 2020-12-24 ENCOUNTER — Telehealth: Payer: Self-pay

## 2020-12-24 NOTE — Telephone Encounter (Signed)
Called patient but had to leave her a detailed message letting her know that we will need to reschedule her procedure with Dr. Vicente Males for another date since he will not be able to do it on 12/31/2020.

## 2020-12-27 ENCOUNTER — Other Ambulatory Visit: Payer: Self-pay

## 2020-12-28 DIAGNOSIS — S62617D Displaced fracture of proximal phalanx of left little finger, subsequent encounter for fracture with routine healing: Secondary | ICD-10-CM | POA: Diagnosis not present

## 2020-12-30 ENCOUNTER — Other Ambulatory Visit: Payer: Self-pay

## 2020-12-31 ENCOUNTER — Ambulatory Visit
Admission: RE | Admit: 2020-12-31 | Discharge: 2020-12-31 | Disposition: A | Discharge status: Home / Self Care | Payer: 59 | Attending: Gastroenterology | Admitting: Gastroenterology

## 2020-12-31 ENCOUNTER — Ambulatory Visit: Payer: 59 | Admitting: Anesthesiology

## 2020-12-31 ENCOUNTER — Encounter: Payer: Self-pay | Admitting: Gastroenterology

## 2020-12-31 ENCOUNTER — Encounter
Admission: RE | Disposition: A | Discharge status: Home / Self Care | Payer: Self-pay | Source: Home / Self Care | Attending: Gastroenterology

## 2020-12-31 ENCOUNTER — Other Ambulatory Visit: Payer: Self-pay

## 2020-12-31 DIAGNOSIS — M5416 Radiculopathy, lumbar region: Secondary | ICD-10-CM | POA: Diagnosis not present

## 2020-12-31 DIAGNOSIS — K6389 Other specified diseases of intestine: Secondary | ICD-10-CM | POA: Diagnosis not present

## 2020-12-31 DIAGNOSIS — Z1211 Encounter for screening for malignant neoplasm of colon: Secondary | ICD-10-CM | POA: Diagnosis not present

## 2020-12-31 DIAGNOSIS — Z7983 Long term (current) use of bisphosphonates: Secondary | ICD-10-CM | POA: Insufficient documentation

## 2020-12-31 DIAGNOSIS — K639 Disease of intestine, unspecified: Secondary | ICD-10-CM

## 2020-12-31 DIAGNOSIS — D122 Benign neoplasm of ascending colon: Secondary | ICD-10-CM | POA: Insufficient documentation

## 2020-12-31 DIAGNOSIS — Z79899 Other long term (current) drug therapy: Secondary | ICD-10-CM | POA: Insufficient documentation

## 2020-12-31 DIAGNOSIS — D123 Benign neoplasm of transverse colon: Secondary | ICD-10-CM | POA: Insufficient documentation

## 2020-12-31 DIAGNOSIS — K635 Polyp of colon: Secondary | ICD-10-CM | POA: Diagnosis not present

## 2020-12-31 DIAGNOSIS — Z791 Long term (current) use of non-steroidal anti-inflammatories (NSAID): Secondary | ICD-10-CM | POA: Diagnosis not present

## 2020-12-31 DIAGNOSIS — Z79891 Long term (current) use of opiate analgesic: Secondary | ICD-10-CM | POA: Insufficient documentation

## 2020-12-31 HISTORY — PX: COLONOSCOPY WITH PROPOFOL: SHX5780

## 2020-12-31 SURGERY — COLONOSCOPY WITH PROPOFOL
Anesthesia: Monitor Anesthesia Care

## 2020-12-31 MED ORDER — PROPOFOL 500 MG/50ML IV EMUL
INTRAVENOUS | Status: DC | PRN
  Administered 2020-12-31: 130 ug/kg/min via INTRAVENOUS

## 2020-12-31 MED ORDER — PROPOFOL 500 MG/50ML IV EMUL
INTRAVENOUS | Status: AC
  Filled 2020-12-31: qty 100

## 2020-12-31 MED ORDER — SODIUM CHLORIDE 0.9 % IV SOLN: INTRAVENOUS | Status: DC

## 2020-12-31 MED ORDER — PROPOFOL 10 MG/ML IV BOLUS
INTRAVENOUS | Status: DC | PRN
  Administered 2020-12-31 (×4): 50 mg via INTRAVENOUS

## 2020-12-31 MED ORDER — LIDOCAINE HCL (CARDIAC) PF 100 MG/5ML IV SOSY
PREFILLED_SYRINGE | INTRAVENOUS | Status: DC | PRN
  Administered 2020-12-31: 25 mg via INTRAVENOUS

## 2020-12-31 NOTE — Op Note (Signed)
[facility] Patient Name: Kendra [name] Procedure Date: 12/31/2020 9:15 AM MRN: [id number] Account #: [id number] Date of Birth: 1971-03-05 Admit Type: Outpatient Age: 50 Room: [facility] ENDO ROOM 2 Gender: Female Note Status: Finalized Procedure:             Colonoscopy Indications:           Screening for colorectal malignant neoplasm Providers:             Mirenda Baltazar B. Bonna Gains MD, MD Referring MD:          Dionne Bucy. Bacigalupo (Referring MD) Medicines:             Monitored Anesthesia Care Complications:         No immediate complications. Procedure:             Pre-Anesthesia Assessment:                        - ASA Grade Assessment: II - A patient with mild                         systemic disease.                        - Prior to the procedure, a History and Physical was                         performed, and patient medications, allergies and                         sensitivities were reviewed. The patient's tolerance                         of previous anesthesia was reviewed.                        - The risks and benefits of the procedure and the                         sedation options and risks were discussed with the                         patient. All questions were answered and informed                         consent was obtained.                        - Patient identification and proposed procedure were                         verified prior to the procedure by the physician, the                         nurse, the anesthesiologist, the anesthetist and the                         technician. The procedure was verified in the                         procedure room.  After obtaining informed consent, the colonoscope was                         passed under direct vision. Throughout the procedure,                         the patient's blood pressure, pulse, and oxygen                         saturations were  monitored continuously. The                         Colonoscope was introduced through the anus and                         advanced to the the cecum, identified by appendiceal                         orifice and ileocecal valve. The colonoscopy was                         performed with ease. The patient tolerated the                         procedure well. The quality of the bowel preparation                         was good. Findings:      The perianal and digital rectal examinations were normal.      Two sessile polyps were found in the ascending colon. The polyps were 4       to 8 mm in size. These polyps were removed with a cold snare. Resection       and retrieval were complete.      A 12 mm polyp was found in the hepatic flexure. The polyp was flat. The       polyp was removed with a piecemeal technique using a cold snare.       Resection and retrieval were complete. To prevent bleeding after the       polypectomy, three hemostatic clips were successfully placed. There was       no bleeding at the end of the procedure.      A patchy area of mildly erythematous mucosa was found in the sigmoid       colon. Biopsies were taken with a cold forceps for histology.      The colon (entire examined portion) was tortuous. Advancing the scope       required straightening and shortening the scope to obtain bowel loop       reduction.      The exam was otherwise without abnormality.      The rectum, sigmoid colon, descending colon, transverse colon, ascending       colon and cecum appeared normal.      The retroflexed view of the distal rectum and anal verge was normal and       showed no anal or rectal abnormalities. Impression:            - Two 4 to 8 mm polyps in the ascending colon, removed  with a cold snare. Resected and retrieved.                        - One 12 mm polyp at the hepatic flexure, removed                         piecemeal using a cold snare.  Resected and retrieved.                         Clips were placed.                        - Erythematous mucosa in the sigmoid colon. Biopsied.                        - Tortuous colon.                        - The examination was otherwise normal.                        - The rectum, sigmoid colon, descending colon,                         transverse colon, ascending colon and cecum are normal.                        - The distal rectum and anal verge are normal on                         retroflexion view. Recommendation:        - Await pathology results.                        - Discharge patient to home (with escort).                        - Advance diet as tolerated.                        - Continue present medications.                        - Repeat colonoscopy date to be determined after                         pending pathology results are reviewed.                        - The findings and recommendations were discussed with                         the patient.                        - The findings and recommendations were discussed with                         the patient's family.                        -  Return to primary care physician as previously                         scheduled. Procedure Code(s):     --- Professional ---                        [phone], Colonoscopy, flexible; with removal of                         tumor(s), polyp(s), or other lesion(s) by snare                         technique                        [id number], 12, Colonoscopy, flexible; with biopsy, single                         or multiple Diagnosis Code(s):     --- Professional ---                        K63.5, Polyp of colon                        Z12.11, Encounter for screening for malignant neoplasm                         of colon                        K63.89, Other specified diseases of intestine CPT copyright 2019 American Medical Association. All rights reserved. The codes documented in  this report are preliminary and upon coder review may  be revised to meet current compliance requirements.  Vonda Antigua, MD Margretta Sidle B. Bonna Gains MD, MD 12/31/2020 10:27:51 AM This report has been signed electronically. Number of Addenda: 0 Note Initiated On: 12/31/2020 9:15 AM Scope Withdrawal Time: 0 hours 33 minutes 11 seconds  Total Procedure Duration: 0 hours 46 minutes 28 seconds  Estimated Blood Loss:  Estimated blood loss: none.      [facility]

## 2020-12-31 NOTE — H&P (Signed)
Vonda Antigua, MD [address] [place], [place], [place], [address] [address], [place], [place], [place], [address] Phone: [phone]  Fax: [phone]  Primary Care Physician:  Virginia Crews, MD   Pre-Procedure History & Physical: HPI:  Kendra [name] is a 50 y.o. female is here for a colonoscopy.   Past Medical History:  Diagnosis Date   Allergy    Breast cancer ([place]) left   2004 with chemo tx   Osteoporosis    Personal history of chemotherapy     Past Surgical History:  Procedure Laterality Date   BREAST SURGERY     implant left in 2004, and biopsy   MASTECTOMY Left 01/07/03   with tram flap and right breast lift in 2008   TOTAL ABDOMINAL HYSTERECTOMY     related to breast cancer   TOTAL ABDOMINAL HYSTERECTOMY W/ BILATERAL SALPINGOOPHORECTOMY  10/2003    Prior to Admission medications   Medication Sig Start Date End Date Taking? Authorizing Provider  alendronate (FOSAMAX) 70 MG tablet Take 1 tablet (70 mg total) by mouth every 7 (seven) days. Take with a full glass of water on an empty stomach. 10/11/20  Yes Bacigalupo, Dionne Bucy, MD  loratadine-pseudoephedrine (CLARITIN-D 24 HOUR) 10-240 MG 24 hr tablet Take 1 tablet by mouth daily. 08/30/18  Yes Mar Daring, PA-C  Calcium-Vitamin D 600-200 MG-UNIT per tablet Take 1 tablet by mouth daily.     [provider]  COVID-19 At Home Antigen Test [facility] COVID-19 HOME TEST) KIT Use as directed Patient taking differently: Use as directed 10/19/20   Nicks, Ewing Schlein, RPH  cyclobenzaprine (FLEXERIL) 10 MG tablet Take 1 tablet (10 mg total) by mouth 3 (three) times daily as needed for muscle spasms. 07/30/18   Lyndal Pulley, DO  fluticasone (FLONASE) 50 MCG/ACT nasal spray PLACE 1 SPRAY INTO BOTH NOSTRILS DAILY. 05/08/16   Blima Singer, NP  gabapentin (NEURONTIN) 100 MG capsule Take 2 capsules (200 mg total) by mouth 3 (three) times daily. 10/07/20   Lyndal Pulley, DO  ibuprofen (ADVIL)  600 MG tablet Take 1 tablet 3 times a day by oral route. 11/18/20     meloxicam (MOBIC) 15 MG tablet Take 1 tablet (15 mg total) by mouth daily as needed for pain. 11/14/20   Coral Spikes, DO  nitroGLYCERIN (NITRODUR - DOSED IN MG/24 HR) 0.2 mg/hr patch 1/4 patch daily 03/18/18   Lyndal Pulley, DO  ondansetron (ZOFRAN-ODT) 4 MG disintegrating tablet Take one tablet po q 12 prn nausea. 11/18/20     oxyCODONE (OXY IR/ROXICODONE) 5 MG immediate release tablet Take 1 tablet every 4 hours by oral route. 11/18/20     valACYclovir (VALTREX) 1000 MG tablet Take 2 tablets (2,000 mg total) by mouth 2 (two) times daily. At onset of cold sore 01/29/20   Mar Daring, PA-C    Allergies as of 11/16/2020   (No Known Allergies)    Family History  Problem Relation Age of Onset   Arthritis Mother    Hypothyroidism Mother    Breast cancer Mother 55   Alcohol abuse Brother     Social History   Socioeconomic History   Marital status: Married    Spouse name: Alyha Marines   Number of children: 2   Years of education: Not on file   Highest education level: Not on file  Occupational History   Not on file  Tobacco Use   Smoking status: Never   Smokeless tobacco:  Never  Vaping Use   Vaping Use: Never used  Substance and Sexual Activity   Alcohol use: Yes    Alcohol/week: 2.0 standard drinks    Types: 2 Glasses of wine per week   Drug use: No   Sexual activity: Yes    Birth control/protection: Other-see comments    Comment: had total hysterectomy  Other Topics Concern   Not on file  Social History Narrative   Not on file   Social Determinants of Health   Financial Resource Strain: Not on file  Food Insecurity: Not on file  Transportation Needs: Not on file  Physical Activity: Not on file  Stress: Not on file  Social Connections: Not on file  Intimate Partner Violence: Not on file    Review of Systems: See HPI, otherwise negative ROS  Physical Exam: Constitutional: General:    Alert,  Well-developed, well-nourished, pleasant and cooperative in NAD BP 118/71   Pulse (!) 58   Temp (!) 96.2 F (35.7 C) (Temporal)   Resp 16   Ht 5' 7.5" (1.715 m)   Wt 68 kg   SpO2 100%   BMI 23.15 kg/m   Head: Normocephalic, atraumatic.   Eyes:  Sclera clear, no icterus.   Conjunctiva pink.   Mouth:  No deformity or lesions, oropharynx pink & moist.  Neck:  Supple, trachea midline  Respiratory: Normal respiratory effort  Gastrointestinal:  Soft, non-tender and non-distended without masses, hepatosplenomegaly or hernias noted.  No guarding or rebound tenderness.     Cardiac: No clubbing or edema.  No cyanosis. Normal posterior tibial pedal pulses noted.  Lymphatic:  No significant cervical adenopathy.  Psych:  Alert and cooperative. Normal mood and affect.  Musculoskeletal:   Symmetrical without gross deformities. 5/5 Lower extremity strength bilaterally.  Skin: Warm. Intact without significant lesions or rashes. No jaundice.  Neurologic:  Face symmetrical, tongue midline, Normal sensation to touch;  grossly normal neurologically.  Psych:  Alert and oriented x3, Alert and cooperative. Normal mood and affect.  Impression/Plan: Kendra [name] is here for a colonoscopy to be performed for average risk screening.  Risks, benefits, limitations, and alternatives regarding  colonoscopy have been reviewed with the patient.  Questions have been answered.  All parties agreeable.   Virgel Manifold, MD  12/31/2020, 9:22 AM

## 2020-12-31 NOTE — Transfer of Care (Signed)
Immediate Anesthesia Transfer of Care Note  [name]  Procedure(s) Performed: COLONOSCOPY WITH PROPOFOL  Patient Location: PACU and Endoscopy Unit  Anesthesia Type:General  Level of Consciousness: awake  Airway & Oxygen Therapy: Patient Spontanous Breathing  Post-op Assessment: Report given to RN  Post vital signs: stable  Last Vitals:  Vitals Value Taken Time  BP 144/66 12/31/20 1019  Temp 36.4 C 12/31/20 1018  Pulse 64 12/31/20 1023  Resp 17 12/31/20 1023  SpO2 99 % 12/31/20 1023  Vitals shown include unvalidated device data.  Last Pain:  Vitals:   12/31/20 1018  TempSrc: Temporal  PainSc: Asleep         Complications: No notable events documented.

## 2020-12-31 NOTE — Anesthesia Preprocedure Evaluation (Addendum)
Anesthesia Evaluation  Patient identified by MRN, date of birth, ID band Patient awake    Reviewed: Allergy & Precautions, NPO status , Patient's Chart, lab work & pertinent test results  History of Anesthesia Complications Negative for: history of anesthetic complications  Airway Mallampati: II  TM Distance: >3 FB Neck ROM: Full    Dental no notable dental hx.    Pulmonary neg pulmonary ROS,    Pulmonary exam normal breath sounds clear to auscultation       Cardiovascular Exercise Tolerance: Good METS: 3 - Mets negative cardio ROS Normal cardiovascular exam Rhythm:Regular Rate:Normal     Neuro/Psych  Neuromuscular disease negative psych ROS   GI/Hepatic negative GI ROS, Neg liver ROS,   Endo/Other  negative endocrine ROS  Renal/GU negative Renal ROS  negative genitourinary   Musculoskeletal  (+) Arthritis ,   Abdominal   Peds  Hematology negative hematology ROS (+)   Anesthesia Other Findings   Reproductive/Obstetrics negative OB ROS                            Anesthesia Physical Anesthesia Plan  ASA: 2  Anesthesia Plan: MAC   Post-op Pain Management:    Induction: Intravenous  PONV Risk Score and Plan:   Airway Management Planned: Natural Airway and Nasal Cannula  Additional Equipment:   Intra-op Plan:   Post-operative Plan:   Informed Consent: I have reviewed the patients History and Physical, chart, labs and discussed the procedure including the risks, benefits and alternatives for the proposed anesthesia with the patient or authorized representative who has indicated his/her understanding and acceptance.     Dental Advisory Given  Plan Discussed with: Anesthesiologist, CRNA and Surgeon  Anesthesia Plan Comments: (Patient consented for risks of anesthesia including but not limited to:  - adverse reactions to medications - damage to eyes, teeth, lips or other  oral mucosa - nerve damage due to positioning  - sore throat or hoarseness - Damage to heart, brain, nerves, lungs, other parts of body or loss of life  Patient voiced understanding.)        Anesthesia Quick Evaluation

## 2021-01-01 NOTE — Anesthesia Postprocedure Evaluation (Signed)
Anesthesia Post Note  [name]  Procedure(s) Performed: COLONOSCOPY WITH PROPOFOL  Patient location during evaluation: PACU Anesthesia Type: MAC Level of consciousness: awake and alert Pain management: pain level controlled Vital Signs Assessment: post-procedure vital signs reviewed and stable Respiratory status: spontaneous breathing, nonlabored ventilation, respiratory function stable and patient connected to nasal cannula oxygen Cardiovascular status: stable and blood pressure returned to baseline Postop Assessment: no apparent nausea or vomiting Anesthetic complications: no   No notable events documented.   Last Vitals:  Vitals:   12/31/20 1018 12/31/20 1028  BP: (!) 144/66 (!) 154/77  Pulse: 71   Resp: 18   Temp: (!) 36.4 C   SpO2: 98%     Last Pain:  Vitals:   01/01/21 0846  TempSrc:   PainSc: 0-No pain                 Tonny Bollman

## 2021-01-03 ENCOUNTER — Encounter: Payer: Self-pay | Admitting: Gastroenterology

## 2021-01-03 LAB — SURGICAL PATHOLOGY

## 2021-01-06 ENCOUNTER — Other Ambulatory Visit: Payer: Self-pay

## 2021-01-06 MED ORDER — VALACYCLOVIR HCL 1 G PO TABS
ORAL_TABLET | ORAL | 0 refills | Status: DC
  Filled 2021-01-06: qty 50, 15d supply, fill #0

## 2021-01-17 NOTE — Telephone Encounter (Signed)
Procedure was done.

## 2021-01-25 ENCOUNTER — Ambulatory Visit (INDEPENDENT_AMBULATORY_CARE_PROVIDER_SITE_OTHER): Payer: 59 | Admitting: Family Medicine

## 2021-01-25 ENCOUNTER — Other Ambulatory Visit: Payer: Self-pay

## 2021-01-25 VITALS — BP 110/70 | HR 67 | Ht 67.5 in | Wt 152.0 lb

## 2021-01-25 DIAGNOSIS — M9901 Segmental and somatic dysfunction of cervical region: Secondary | ICD-10-CM

## 2021-01-25 DIAGNOSIS — M9902 Segmental and somatic dysfunction of thoracic region: Secondary | ICD-10-CM

## 2021-01-25 DIAGNOSIS — M9908 Segmental and somatic dysfunction of rib cage: Secondary | ICD-10-CM | POA: Diagnosis not present

## 2021-01-25 DIAGNOSIS — M533 Sacrococcygeal disorders, not elsewhere classified: Secondary | ICD-10-CM

## 2021-01-25 DIAGNOSIS — M9904 Segmental and somatic dysfunction of sacral region: Secondary | ICD-10-CM | POA: Diagnosis not present

## 2021-01-25 DIAGNOSIS — M7701 Medial epicondylitis, right elbow: Secondary | ICD-10-CM | POA: Diagnosis not present

## 2021-01-25 DIAGNOSIS — M9903 Segmental and somatic dysfunction of lumbar region: Secondary | ICD-10-CM

## 2021-01-25 NOTE — Progress Notes (Signed)
[name] [place] Kendra [name] Phone: [phone] Subjective:   Kendra [name], am serving as a scribe for Dr. Hulan Saas.  I'm seeing this patient by the request  of:  Kendra [name], Kendra Bucy, MD  CC: Neck and back pain follow-up  [id number]  Kendra [name] is a 50 y.o. female coming in with complaint of back and neck pain. OMT 12/15/2020. Patient states that she was on vacation for two weeks so got behind in her appointments. No new concerns.  Patient continues to have unfortunately elbow pain.  Has not been lifting for 2 weeks and continues to have the same amount of pain.  Symptoms mild radiation to the hand.  Patient describes it as a dull aching sensation that sometimes can give her difficulty at night.  Patient did have an MRI that was independently visualized by me of the elbow showing no significant abnormality.  Does not notice any improvement with the gabapentin.  Medications patient has been prescribed: None  Taking:         Reviewed prior external information including notes and imaging from previsou exam, outside providers and external EMR if available.  This includes patient's most recent colonoscopy as well as patient seen another provider for follow-up of her fracture of the proximal phalanx patient also recently did have a colonoscopy.  As well as notes that were available from care everywhere and other healthcare systems.  Past medical history, social, surgical and family history all reviewed in electronic medical record.  No pertanent information unless stated regarding to the chief complaint.   Past Medical History:  Diagnosis Date   Allergy    Breast cancer ([place]) left   2004 with chemo tx   Osteoporosis    Personal history of chemotherapy     No Known Allergies   Review of Systems:  No headache, visual changes, nausea, vomiting, diarrhea, constipation, dizziness, abdominal pain, skin rash, fevers,  chills, night sweats, weight loss, swollen lymph nodes, body aches, joint swelling, chest pain, shortness of breath, mood changes. POSITIVE muscle aches  Objective  Blood pressure 110/70, pulse 67, height 5' 7.5" (1.715 m), weight 152 lb (68.9 kg), SpO2 99 %.   General: No apparent distress alert and oriented x3 mood and affect normal, dressed appropriately.  HEENT: Pupils equal, extraocular movements intact  Respiratory: Patient's speak in full sentences and does not appear short of breath  Cardiovascular: No lower extremity edema, non tender, no erythema  Right elbow still tender to palpation of the medial epicondylar region.  He does have pain with resisted flexion.  Mild positive Tinel's at the cubital tunnel  Patient also has limited range of motion of the neck noted.  Significant tightness noted of the right sacroiliac joint with a positive FABER test.  Negative straight leg test.  Worsening pain with extension of the back noted.  Osteopathic findings  C7 flexed rotated and side bent left T4 extended rotated and side bent right inhaled rib T8 extended rotated and side bent left L2 flexed rotated and side bent right Sacrum right on right      Assessment and Plan:  Medial epicondylitis of elbow, right Continues to have pain going on 8 months.  MRI did not show anything specific.  Could potentially consider an MRI with contrast to further evaluate the ulnar collateral ligament but I do not feel any significant instability.  We will consider the possibility of a ulnar nerve injection in the cubital  tunnel for further therapeutic and possible diagnostic approach.  The main thing I do feel imaging would be warranted again.  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  Does respond fairly well to osteopathic manipulation.  Increasing tenderness over the right side noted patient denies any new injury just has not been doing the  exercises as regularly.  Follow-up again in 4 to 8 weeks   Nonallopathic problems  Decision today to treat with OMT was based on Physical Exam  After verbal consent patient was treated with HVLA, ME, FPR techniques in cervical, rib, thoracic, lumbar, and sacral  areas  Patient tolerated the procedure well with improvement in symptoms  Patient given exercises, stretches and lifestyle modifications  See medications in patient instructions if given  Patient will follow up in 4-8 weeks      The above documentation has been reviewed and is accurate and complete Kendra Pulley, DO       Note: This dictation was prepared with Dragon dictation along with smaller phrase technology. Any transcriptional errors that result from this process are unintentional.

## 2021-01-25 NOTE — Patient Instructions (Addendum)
Good to see you  Elbow compression sleeve during exercise Ice after exercise  Lets inject in four weeks See me again in 4 weeks

## 2021-01-25 NOTE — Assessment & Plan Note (Signed)
Continues to have pain going on 8 months.  MRI did not show anything specific.  Could potentially consider an MRI with contrast to further evaluate the ulnar collateral ligament but I do not feel any significant instability.  We will consider the possibility of a ulnar nerve injection in the cubital tunnel for further therapeutic and possible diagnostic approach.  The main thing I do feel imaging would be warranted again.  Follow-up with me again 4 to 6 weeks

## 2021-01-25 NOTE — Assessment & Plan Note (Signed)
Sacroiliac joint dysfunction.  Does respond fairly well to osteopathic manipulation.  Increasing tenderness over the right side noted patient denies any new injury just has not been doing the exercises as regularly.  Follow-up again in 4 to 8 weeks

## 2021-01-31 ENCOUNTER — Other Ambulatory Visit: Payer: Self-pay

## 2021-02-21 NOTE — Progress Notes (Signed)
Kendra [name] [address] [place] Phone: [phone] Subjective:   IVilma [name], am serving as a scribe for Dr. Hulan Saas. This visit occurred during the SARS-CoV-2 public health emergency.  Safety protocols were in place, including screening questions prior to the visit, additional usage of staff PPE, and extensive cleaning of exam room while observing appropriate contact time as indicated for disinfecting solutions.   I'm seeing this patient by the request  of:  Bacigalupo, Dionne Bucy, MD  CC: Neck and back pain follow-up  [id number]  Kendra [name] is a 50 y.o. female coming in with complaint of back and neck pain. OMT 01/25/2021. Patient states she is doing well. Still have continued back and neck pain. The pain in her right elbow has not gotten better.  Patient states that the right elbow continues to give difficulty at the moment.  Still hurts her when she tries to do anything more than lifting more than 5 pounds.  Still seems to be more over the medial than lateral.  Medications patient has been prescribed: Gabapentin  Taking: Yes          Past Medical History:  Diagnosis Date   Allergy    Breast cancer ([place]) left   2004 with chemo tx   Osteoporosis    Personal history of chemotherapy     No Known Allergies   Review of Systems:  No headache, visual changes, nausea, vomiting, diarrhea, constipation, dizziness, abdominal pain, skin rash, fevers, chills, night sweats, weight loss, swollen lymph nodes, body aches, joint swelling, chest pain, shortness of breath, mood changes. POSITIVE muscle aches  Objective  Blood pressure 112/62, pulse 60, height '5\' 7"'$  (1.702 m), weight 153 lb (69.4 kg), SpO2 96 %.   General: No apparent distress alert and oriented x3 mood and affect normal, dressed appropriately.  HEENT: Pupils equal, extraocular movements intact  Respiratory: Patient's speak in full sentences and does not appear  short of breath  Cardiovascular: No lower extremity edema, non tender, no erythema  Right elbow exam.  Patient does have tenderness over the medial epicondylar region.  Good grip strength noted though.  Full range of motion of the elbow noted.  Patient's neck exam does have some mild loss of lordosis.  Negative Spurling's.  Patient does have tightness still with right-sided rotation as well as sidebending.  Patient does have full extension noted.  Osteopathic findings  C2 flexed rotated and side bent right T3 extended rotated and side bent right inhaled rib T6 extended rotated and side bent left L2 flexed rotated and side bent right Sacrum right on right  Procedure: Real-time Ultrasound Guided Injection of right common flexor tendon sheath Device: GE Logiq Q7 Ultrasound guided injection is preferred based studies that show increased duration, increased effect, greater accuracy, decreased procedural pain, increased response rate, and decreased cost with ultrasound guided versus blind injection.  Verbal informed consent obtained.  Time-out conducted.  Noted no overlying erythema, induration, or other signs of local infection.  Skin prepped in a sterile fashion.  Local anesthesia: Topical Ethyl chloride.  With sterile technique and under real time ultrasound guidance: With a 25-gauge half inch needle patient was injected into the medial epicondylar region in the common flexor tendon sheath with a total of 0.5 cc of 0.5% Marcaine and 0.5 cc of Kenalog 40 mg/mL. Completed without difficulty  Pain immediately resolved suggesting accurate placement of the medication.  Advised to call if fevers/chills, erythema, induration, drainage,  or persistent bleeding.  Impression: Technically successful ultrasound guided injection.     Assessment and Plan:  Medial epicondylitis of elbow, right Patient given injection today, tolerated the procedure well.  I discussed icing regimen and home exercises.   Discussed avoiding certain activities.  Increase activity slowly.  If patient does not make improvement I would like to get a nerve conduction study to further evaluate if there is any also nerve aspect of this pain.   Nonallopathic problems  Decision today to treat with OMT was based on Physical Exam  After verbal consent patient was treated with HVLA, ME, FPR techniques in cervical, rib, thoracic, lumbar, and sacral  areas  Patient tolerated the procedure well with improvement in symptoms  Patient given exercises, stretches and lifestyle modifications  See medications in patient instructions if given  Patient will follow up in 4-8 weeks      The above documentation has been reviewed and is accurate and complete Lyndal Pulley, DO       Note: This dictation was prepared with Dragon dictation along with smaller phrase technology. Any transcriptional errors that result from this process are unintentional.

## 2021-02-22 ENCOUNTER — Ambulatory Visit: Payer: Self-pay

## 2021-02-22 ENCOUNTER — Ambulatory Visit (INDEPENDENT_AMBULATORY_CARE_PROVIDER_SITE_OTHER): Payer: 59 | Admitting: Family Medicine

## 2021-02-22 ENCOUNTER — Other Ambulatory Visit: Payer: Self-pay

## 2021-02-22 VITALS — BP 112/62 | HR 60 | Ht 67.0 in | Wt 153.0 lb

## 2021-02-22 DIAGNOSIS — M9903 Segmental and somatic dysfunction of lumbar region: Secondary | ICD-10-CM

## 2021-02-22 DIAGNOSIS — M7701 Medial epicondylitis, right elbow: Secondary | ICD-10-CM

## 2021-02-22 DIAGNOSIS — M9901 Segmental and somatic dysfunction of cervical region: Secondary | ICD-10-CM | POA: Diagnosis not present

## 2021-02-22 DIAGNOSIS — M9904 Segmental and somatic dysfunction of sacral region: Secondary | ICD-10-CM | POA: Diagnosis not present

## 2021-02-22 DIAGNOSIS — M9902 Segmental and somatic dysfunction of thoracic region: Secondary | ICD-10-CM | POA: Diagnosis not present

## 2021-02-22 DIAGNOSIS — M9908 Segmental and somatic dysfunction of rib cage: Secondary | ICD-10-CM

## 2021-02-22 NOTE — Patient Instructions (Addendum)
Good to see you! Tried injection in elbow, we'll see how that goes See you again in 5-6 weeks

## 2021-02-22 NOTE — Assessment & Plan Note (Signed)
Patient given injection today, tolerated the procedure well.  I discussed icing regimen and home exercises.  Discussed avoiding certain activities.  Increase activity slowly.  If patient does not make improvement I would like to get a nerve conduction study to further evaluate if there is any also nerve aspect of this pain.

## 2021-02-28 ENCOUNTER — Other Ambulatory Visit: Payer: Self-pay

## 2021-03-29 ENCOUNTER — Ambulatory Visit: Payer: 59 | Admitting: Family Medicine

## 2021-03-30 NOTE — Progress Notes (Deleted)
  [place] [address] [place] Phone: [phone] Subjective:    I'm seeing this patient by the request  of:  Bacigalupo, Dionne Bucy, MD  CC:   [id number]  Kendra [name] is a 50 y.o. female coming in with complaint of back and neck pain. OMT 02/22/2021. Also f/u for right elbow pain. Patient states   Medications patient has been prescribed: None  Taking:         Reviewed prior external information including notes and imaging from previsou exam, outside providers and external EMR if available.   As well as notes that were available from care everywhere and other healthcare systems.  Past medical history, social, surgical and family history all reviewed in electronic medical record.  No pertanent information unless stated regarding to the chief complaint.   Past Medical History:  Diagnosis Date   Allergy    Breast cancer ([place]) left   2004 with chemo tx   Osteoporosis    Personal history of chemotherapy     No Known Allergies   Review of Systems:  No headache, visual changes, nausea, vomiting, diarrhea, constipation, dizziness, abdominal pain, skin rash, fevers, chills, night sweats, weight loss, swollen lymph nodes, body aches, joint swelling, chest pain, shortness of breath, mood changes. POSITIVE muscle aches  Objective  There were no vitals taken for this visit.   General: No apparent distress alert and oriented x3 mood and affect normal, dressed appropriately.  HEENT: Pupils equal, extraocular movements intact  Respiratory: Patient's speak in full sentences and does not appear short of breath  Cardiovascular: No lower extremity edema, non tender, no erythema  Neuro: Cranial nerves II through XII are intact, neurovascularly intact in all extremities with 2+ DTRs and 2+ pulses.  Gait normal with good balance and coordination.  MSK:  Non tender with full range of motion and good stability and symmetric strength and  tone of shoulders, elbows, wrist, hip, knee and ankles bilaterally.  Back - Normal skin, Spine with normal alignment and no deformity.  No tenderness to vertebral process palpation.  Paraspinous muscles are not tender and without spasm.   Range of motion is full at neck and lumbar sacral regions  Osteopathic findings  C2 flexed rotated and side bent right C6 flexed rotated and side bent left T3 extended rotated and side bent right inhaled rib T9 extended rotated and side bent left L2 flexed rotated and side bent right Sacrum right on right       Assessment and Plan:    Nonallopathic problems  Decision today to treat with OMT was based on Physical Exam  After verbal consent patient was treated with HVLA, ME, FPR techniques in cervical, rib, thoracic, lumbar, and sacral  areas  Patient tolerated the procedure well with improvement in symptoms  Patient given exercises, stretches and lifestyle modifications  See medications in patient instructions if given  Patient will follow up in 4-8 weeks      The above documentation has been reviewed and is accurate and complete Jacqualin Combes       Note: This dictation was prepared with Dragon dictation along with smaller phrase technology. Any transcriptional errors that result from this process are unintentional.

## 2021-03-31 ENCOUNTER — Ambulatory Visit: Payer: 59 | Admitting: Family Medicine

## 2021-04-06 ENCOUNTER — Other Ambulatory Visit: Payer: Self-pay

## 2021-04-13 ENCOUNTER — Other Ambulatory Visit: Payer: Self-pay

## 2021-04-25 ENCOUNTER — Other Ambulatory Visit: Payer: Self-pay

## 2021-05-03 ENCOUNTER — Other Ambulatory Visit: Payer: Self-pay

## 2021-05-03 ENCOUNTER — Ambulatory Visit: Payer: 59 | Admitting: Family Medicine

## 2021-05-03 NOTE — Progress Notes (Signed)
[place] Phone: [phone] Subjective:   [name], am serving as a scribe for Dr. Hulan Saas. This visit occurred during the SARS-CoV-2 public health emergency.  Safety protocols were in place, including screening questions prior to the visit, additional usage of staff PPE, and extensive cleaning of exam room while observing appropriate contact time as indicated for disinfecting solutions.   I'm seeing this patient by the request  of:  Bacigalupo, Dionne Bucy, MD  CC: Back pain and neck pain follow-up  [id number]  ZURI LASCALA is a 50 y.o. female coming in with complaint of back and neck pain. OMT 02/22/2021. Also f/u for medial epi which patient did have a injection given in September. Patient states that injection did help elbow but she still has pain. Now having pain in anterior R shoulder.   R hip is hurting so she feels back is out. Wants to make sure that [employer] [facility] are still the best for her. Having L heel pain over medial aspect. No injury to heel or shoulder.   Medications patient has been prescribed: Gabapentin  Taking:         Reviewed prior external information including notes and imaging from previsou exam, outside providers and external EMR if available.   As well as notes that were available from care everywhere and other healthcare systems.  Past medical history, social, surgical and family history all reviewed in electronic medical record.  No pertanent information unless stated regarding to the chief complaint.   Past Medical History:  Diagnosis Date   Allergy    Breast cancer ([place]) left   2004 with chemo tx   Osteoporosis    Personal history of chemotherapy     No Known Allergies   Review of Systems:  No headache, visual changes, nausea, vomiting, diarrhea, constipation, dizziness, abdominal pain, skin rash, fevers, chills, night sweats, weight loss,  swollen lymph nodes, body aches, joint swelling, chest pain, shortness of breath, mood changes. POSITIVE muscle aches  Objective  Blood pressure 102/80, pulse 73, height 5\' 7"  (1.702 m), weight 155 lb (70.3 kg), SpO2 98 %.   General: No apparent distress alert and oriented x3 mood and affect normal, dressed appropriately.  HEENT: Pupils equal, extraocular movements intact  Respiratory: Patient's speak in full sentences and does not appear short of breath  Cardiovascular: No lower extremity edema, non tender, no erythema  Right elbow exam shows minimal discomfort noted over the medial epicondylar area.  Good grip strength noted.  5 out of 5 strength Low back exam shows patient does have tenderness to palpation more in the thoracolumbar juncture right greater than left.  Tightness noted in the right parascapular region especially around the T5 area.  Osteopathic findings  C2 flexed rotated and side bent right C6 flexed rotated and side bent left T5 extended rotated and side bent right inhaled rib T11 extended rotated and side bent left L2 flexed rotated and side bent right Sacrum right on right  Limited muscular skeletal ultrasound was performed and interpreted by Hulan Saas, M  Limited ultrasound of patient's right elbow shows no significant hypoechoic changes are increasing in neovascularization or Doppler flow.  No significant improvement or interval improvement since previous exam Impression: Interval improvement from medial epicondylitis     Assessment and Plan:  Medial epicondylitis of elbow, right Improvement noted.  In greater than 2 months since the injection.  Discussed with patient to  continue to monitor.  Discussed icing regimen and home exercises.  Follow-up again in 6 to 8 weeks  SI (sacroiliac) joint dysfunction Chronic, with exacerbation.  Patient is trying to be more active.  Did respond well to osteopathic manipulation again today.  Has different medications for  breakthrough pain including gabapentin, ibuprofen, Flexeril.  Follow-up again in 6 to 8 weeks   Nonallopathic problems  Decision today to treat with OMT was based on Physical Exam  After verbal consent patient was treated with HVLA, ME, FPR techniques in cervical, rib, thoracic, lumbar, and sacral  areas  Patient tolerated the procedure well with improvement in symptoms  Patient given exercises, stretches and lifestyle modifications  See medications in patient instructions if given  Patient will follow up in 4-8 weeks      The above documentation has been reviewed and is accurate and complete Lyndal Pulley, DO        Note: This dictation was prepared with Dragon dictation along with smaller phrase technology. Any transcriptional errors that result from this process are unintentional.

## 2021-05-09 ENCOUNTER — Ambulatory Visit (INDEPENDENT_AMBULATORY_CARE_PROVIDER_SITE_OTHER): Payer: 59 | Admitting: Family Medicine

## 2021-05-09 ENCOUNTER — Ambulatory Visit: Payer: Self-pay

## 2021-05-09 ENCOUNTER — Other Ambulatory Visit: Payer: Self-pay

## 2021-05-09 VITALS — BP 102/80 | HR 73 | Ht 67.0 in | Wt 155.0 lb

## 2021-05-09 DIAGNOSIS — M9902 Segmental and somatic dysfunction of thoracic region: Secondary | ICD-10-CM | POA: Diagnosis not present

## 2021-05-09 DIAGNOSIS — M7701 Medial epicondylitis, right elbow: Secondary | ICD-10-CM | POA: Diagnosis not present

## 2021-05-09 DIAGNOSIS — M9904 Segmental and somatic dysfunction of sacral region: Secondary | ICD-10-CM | POA: Diagnosis not present

## 2021-05-09 DIAGNOSIS — M533 Sacrococcygeal disorders, not elsewhere classified: Secondary | ICD-10-CM

## 2021-05-09 DIAGNOSIS — M25521 Pain in right elbow: Secondary | ICD-10-CM

## 2021-05-09 DIAGNOSIS — M9903 Segmental and somatic dysfunction of lumbar region: Secondary | ICD-10-CM

## 2021-05-09 DIAGNOSIS — M9901 Segmental and somatic dysfunction of cervical region: Secondary | ICD-10-CM

## 2021-05-09 DIAGNOSIS — M9908 Segmental and somatic dysfunction of rib cage: Secondary | ICD-10-CM

## 2021-05-09 NOTE — Patient Instructions (Addendum)
Update orthotics Elbow looks great OOFOS or HOKA recovery sandals See me in 5-6 weeks

## 2021-05-10 ENCOUNTER — Encounter: Payer: Self-pay | Admitting: Family Medicine

## 2021-05-10 NOTE — Assessment & Plan Note (Signed)
Improvement noted.  In greater than 2 months since the injection.  Discussed with patient to continue to monitor.  Discussed icing regimen and home exercises.  Follow-up again in 6 to 8 weeks

## 2021-05-10 NOTE — Assessment & Plan Note (Signed)
Chronic, with exacerbation.  Patient is trying to be more active.  Did respond well to osteopathic manipulation again today.  Has different medications for breakthrough pain including gabapentin, ibuprofen, Flexeril.  Follow-up again in 6 to 8 weeks

## 2021-05-31 ENCOUNTER — Other Ambulatory Visit: Payer: Self-pay

## 2021-06-02 NOTE — Progress Notes (Signed)
[place] Phone: [phone] Subjective:   [name], am serving as a scribe for Dr. Hulan Saas. This visit occurred during the SARS-CoV-2 public health emergency.  Safety protocols were in place, including screening questions prior to the visit, additional usage of staff PPE, and extensive cleaning of exam room while observing appropriate contact time as indicated for disinfecting solutions.  I'm seeing this patient by the request  of:  Bacigalupo, Dionne Bucy, MD  CC: back and neck pain   [id number]  LOUCILLE TAKACH is a 50 y.o. female coming in with complaint of back and neck pain. OMT 05/09/2021. Also f/u for R elbow, medial epi pain. Patient states that her elbow is getting better but still has pain with most motions. Back pain is the same as last visit. Not as tight.   Continues to have L heel pain over medial calcaneus. Using 600mg  IBU BID. Using inserts in shoes. Pain with activity and after being seated for a prolonged period. Has been stretching heel cord.   Medications patient has been prescribed: None         Reviewed prior external information including notes and imaging from previsou exam, outside providers and external EMR if available.   As well as notes that were available from care everywhere and other healthcare systems.  Past medical history, social, surgical and family history all reviewed in electronic medical record.  No pertanent information unless stated regarding to the chief complaint.   Past Medical History:  Diagnosis Date   Allergy    Breast cancer ([place]) left   2004 with chemo tx   Osteoporosis    Personal history of chemotherapy     No Known Allergies   Review of Systems:  No headache, visual changes, nausea, vomiting, diarrhea, constipation, dizziness, abdominal pain, skin rash, fevers, chills, night sweats, weight loss, swollen lymph nodes, body aches, joint  swelling, chest pain, shortness of breath, mood changes. POSITIVE muscle aches  Objective  Blood pressure 128/74, pulse (!) 59, height 5\' 7"  (1.702 m), weight 157 lb (71.2 kg), SpO2 98 %.   General: No apparent distress alert and oriented x3 mood and affect normal, dressed appropriately.  HEENT: Pupils equal, extraocular movements intact  Respiratory: Patient's speak in full sentences and does not appear short of breath  Cardiovascular: No lower extremity edema, non tender, no erythema  Back exam extra tightness noted in the paraspinal musculature on the right side at the moment.  The patient is tender to palpation over the right sacroiliac joint and the gluteal area.  No radicular symptoms with straight leg test. Patient heel and tenderness to palpation over the calcaneal area.  Patient has good range of motion of the ankle noted.  Neurovascularly intact distally.  5 out of 5 strength  Osteopathic findings  C2 flexed rotated and side bent right C7 flexed rotated and side bent left T2 extended rotated and side bent right inhaled rib T6 extended rotated and side bent left L2 flexed rotated and side bent right Sacrum right on right Pelvic shear right  Limited muscular skeletal ultrasound was performed and interpreted by Hulan Saas, M  Limited ultrasound of patient's left heel shows some hypoechoic changes and some changes that is significant for mild plantar fasciitis.  No cortical irregularity of the calcaneal area noted.  Achilles do not have any significant findings either.     Assessment and Plan:  Plantar fasciitis of left foot  We reviewed that stretching is critically important to the treatment of PF.  Reviewed footwear. Rigid soles have been shown to help with PF. Night splints can sometimes help. Reviewed rehab of stretching and calf raises.   Could benefit from a corticosteroid injection, orthotics, or other measures if conservative treatment fails.   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  Discussed posture and ergonomics, discussed which activities to do and which ones to avoid.  Increase activity slowly.  Patient has responded well to manipulation.  Discussed stretching after heavy lifting.  Patient is still working with a Clinical research associate.  Follow-up again in 6 to 8 weeks    Nonallopathic problems  Decision today to treat with OMT was based on Physical Exam  After verbal consent patient was treated with HVLA, ME, FPR techniques in cervical, rib, thoracic, lumbar, and sacral  areas  Patient tolerated the procedure well with improvement in symptoms  Patient given exercises, stretches and lifestyle modifications  See medications in patient instructions if given  Patient will follow up in 4-8 weeks      The above documentation has been reviewed and is accurate and complete Lyndal Pulley, DO        Note: This dictation was prepared with Dragon dictation along with smaller phrase technology. Any transcriptional errors that result from this process are unintentional.

## 2021-06-07 ENCOUNTER — Ambulatory Visit (INDEPENDENT_AMBULATORY_CARE_PROVIDER_SITE_OTHER): Payer: 59 | Admitting: Family Medicine

## 2021-06-07 ENCOUNTER — Encounter: Payer: Self-pay | Admitting: Family Medicine

## 2021-06-07 ENCOUNTER — Ambulatory Visit: Payer: Self-pay

## 2021-06-07 ENCOUNTER — Ambulatory Visit: Payer: 59

## 2021-06-07 ENCOUNTER — Other Ambulatory Visit: Payer: Self-pay

## 2021-06-07 VITALS — BP 128/74 | HR 59 | Ht 67.0 in | Wt 157.0 lb

## 2021-06-07 DIAGNOSIS — M722 Plantar fascial fibromatosis: Secondary | ICD-10-CM | POA: Diagnosis not present

## 2021-06-07 DIAGNOSIS — M9905 Segmental and somatic dysfunction of pelvic region: Secondary | ICD-10-CM | POA: Diagnosis not present

## 2021-06-07 DIAGNOSIS — M79672 Pain in left foot: Secondary | ICD-10-CM | POA: Diagnosis not present

## 2021-06-07 DIAGNOSIS — M9902 Segmental and somatic dysfunction of thoracic region: Secondary | ICD-10-CM

## 2021-06-07 DIAGNOSIS — M9904 Segmental and somatic dysfunction of sacral region: Secondary | ICD-10-CM

## 2021-06-07 DIAGNOSIS — M9903 Segmental and somatic dysfunction of lumbar region: Secondary | ICD-10-CM | POA: Diagnosis not present

## 2021-06-07 DIAGNOSIS — M9901 Segmental and somatic dysfunction of cervical region: Secondary | ICD-10-CM

## 2021-06-07 DIAGNOSIS — M533 Sacrococcygeal disorders, not elsewhere classified: Secondary | ICD-10-CM | POA: Diagnosis not present

## 2021-06-07 DIAGNOSIS — M9908 Segmental and somatic dysfunction of rib cage: Secondary | ICD-10-CM | POA: Diagnosis not present

## 2021-06-07 NOTE — Assessment & Plan Note (Signed)
Chronic more right-sided.  Discussed posture and ergonomics, discussed which activities to do and which ones to avoid.  Increase activity slowly.  Patient has responded well to manipulation.  Discussed stretching after heavy lifting.  Patient is still working with a Clinical research associate.  Follow-up again in 6 to 8 weeks

## 2021-06-07 NOTE — Assessment & Plan Note (Signed)
We reviewed that stretching is critically important to the treatment of PF.  Reviewed footwear. Rigid soles have been shown to help with PF. Night splints can sometimes help. Reviewed rehab of stretching and calf raises.   Could benefit from a corticosteroid injection, orthotics, or other measures if conservative treatment fails.

## 2021-06-07 NOTE — Patient Instructions (Addendum)
Xray L foot today Continue with everything Will take time Happy new year See me again in 6-8 weeks

## 2021-06-16 DIAGNOSIS — L821 Other seborrheic keratosis: Secondary | ICD-10-CM | POA: Diagnosis not present

## 2021-06-16 DIAGNOSIS — D2262 Melanocytic nevi of left upper limb, including shoulder: Secondary | ICD-10-CM | POA: Diagnosis not present

## 2021-06-16 DIAGNOSIS — D2261 Melanocytic nevi of right upper limb, including shoulder: Secondary | ICD-10-CM | POA: Diagnosis not present

## 2021-06-16 DIAGNOSIS — D225 Melanocytic nevi of trunk: Secondary | ICD-10-CM | POA: Diagnosis not present

## 2021-06-16 DIAGNOSIS — L84 Corns and callosities: Secondary | ICD-10-CM | POA: Diagnosis not present

## 2021-06-16 DIAGNOSIS — D2272 Melanocytic nevi of left lower limb, including hip: Secondary | ICD-10-CM | POA: Diagnosis not present

## 2021-06-16 DIAGNOSIS — L817 Pigmented purpuric dermatosis: Secondary | ICD-10-CM | POA: Diagnosis not present

## 2021-06-16 DIAGNOSIS — D2271 Melanocytic nevi of right lower limb, including hip: Secondary | ICD-10-CM | POA: Diagnosis not present

## 2021-06-27 ENCOUNTER — Other Ambulatory Visit: Payer: Self-pay

## 2021-08-01 ENCOUNTER — Other Ambulatory Visit: Payer: Self-pay

## 2021-08-11 NOTE — Progress Notes (Signed)
?Kendra [name] D.O. ?Kendra [name] ?Kendra [name] ?Phone: [phone] ?Subjective:   ?I, Kendra [name], am serving as a Education administrator for Dr. Hulan [name]. ?This visit occurred during the SARS-CoV-2 public health emergency.  Safety protocols were in place, including screening questions prior to the visit, additional usage of staff PPE, and extensive cleaning of exam room while observing appropriate contact time as indicated for disinfecting solutions.  ? ?I'm seeing this patient by the request  of:  Bacigalupo, Kendra Bucy, MD ? ?CC: Back pain, knee pain and heel pain follow-up ? ?[id number]  ?Kendra [name] is a 51 y.o. female coming in with complaint of back and neck pain. OMT 06/07/2021. Also f/u for L heel pain. Patient states Patient states here for manipulation. No change in heel pain. No new complaints. ? ?Medications patient has been prescribed: None ? ? ?  ? ? ? ? ?Reviewed prior external information including notes and imaging from previsou exam, outside providers and external EMR if available.  ? ?As well as notes that were available from care everywhere and other healthcare systems. ? ?Past medical history, social, surgical and family history all reviewed in electronic medical record.  No pertanent information unless stated regarding to the chief complaint.  ? ?Past Medical History:  ?Diagnosis Date  ? Allergy   ? Breast cancer ([place]) left  ? 2004 with chemo tx  ? Osteoporosis   ? Personal history of chemotherapy   ?  ?No Known Allergies ? ? ?Review of Systems: ? No headache, visual changes, nausea, vomiting, diarrhea, constipation, dizziness, abdominal pain, skin rash, fevers, chills, night sweats, weight loss, swollen lymph nodes, body aches, joint swelling, chest pain, shortness of breath, mood changes. POSITIVE muscle aches ? ?Objective  ?Blood pressure 128/76, pulse 72, height 5\' 7"  (1.702 m), weight 156 lb (70.8 kg), SpO2 96 %. ?  ?General: No apparent distress alert and  oriented x3 mood and affect normal, dressed appropriately.  ?HEENT: Pupils equal, extraocular movements intact  ?Respiratory: Patient's speak in full sentences and does not appear short of breath  ?Cardiovascular: No lower extremity edema, non tender, no erythema  ?Patient back exam does show tightness noted around the sacroiliac joint.  Does have tightness in the parascapular region right greater than left.  Tightness with sidebending of the neck bilaterally. ?Left foot and does still have tenderness to palpation at the calcaneal region on the plantar aspect of the foot.  Tenderness over the medial calcaneal area as well.  Good range of motion otherwise of the ankle. ? ?Osteopathic findings ? ?C2 flexed rotated and side bent right ?C6 flexed rotated and side bent left ?T3 extended rotated and side bent right inhaled rib ?T9 extended rotated and side bent left ?L2 flexed rotated and side bent right ?Sacrum right on right ? ? ?Limited muscular skeletal ultrasound was performed and interpreted by Kendra [name], M  ?Right Limited ultrasound of patient's heel does not have any cortical irregularity but does have overlying hypoechoic changes of the fat pad.  Mild inflammation noted with hypoechoic changes and dilatation of the plantar fascia.  Mild increase in neovascularization of the most superficial layer of the plantar fascia that can be consistent with partial tearing. ?Impression: Partial tearing of the plantar fascia but no retraction. ? ? ?  ?Assessment and Plan: ? ?Plantar fasciitis of left foot ?Continues to have some mild inflammation noted in the area.  Patient also has inflammation of the overlying fat pad.  Discussed  with patient about icing regimen, home exercises, discussed potential continued issues.  Patient declined any injection or formal physical therapy.  Follow-up again in 4 to 8 weeks otherwise. ? ?SI (sacroiliac) joint dysfunction ?Chronic problem with exacerbation.  Patient could be potentially  having a different gait secondary to the discomfort and pain.  Discussed icing regimen and home exercises, discussed which activities to do and which ones to avoid, increase activity slowly.  Follow-up again in 6 to 8 weeks  ? ?Nonallopathic problems ? ?Decision today to treat with OMT was based on Physical Exam ? ?After verbal consent patient was treated with HVLA, ME, FPR techniques in cervical, rib, thoracic, lumbar, and sacral  areas ? ?Patient tolerated the procedure well with improvement in symptoms ? ?Patient given exercises, stretches and lifestyle modifications ? ?See medications in patient instructions if given ? ?Patient will follow up in 4-8 weeks ? ?  ? ? ?The above documentation has been reviewed and is accurate and complete Kendra Pulley, DO ? ? ?  ? ? Note: This dictation was prepared with Dragon dictation along with smaller phrase technology. Any transcriptional errors that result from this process are unintentional.    ?  ?  ? ?

## 2021-08-16 ENCOUNTER — Other Ambulatory Visit: Payer: Self-pay

## 2021-08-16 ENCOUNTER — Ambulatory Visit: Payer: Self-pay

## 2021-08-16 ENCOUNTER — Ambulatory Visit (INDEPENDENT_AMBULATORY_CARE_PROVIDER_SITE_OTHER): Payer: 59 | Admitting: Family Medicine

## 2021-08-16 VITALS — BP 128/76 | HR 72 | Ht 67.0 in | Wt 156.0 lb

## 2021-08-16 DIAGNOSIS — M722 Plantar fascial fibromatosis: Secondary | ICD-10-CM

## 2021-08-16 DIAGNOSIS — M9902 Segmental and somatic dysfunction of thoracic region: Secondary | ICD-10-CM

## 2021-08-16 DIAGNOSIS — M533 Sacrococcygeal disorders, not elsewhere classified: Secondary | ICD-10-CM

## 2021-08-16 DIAGNOSIS — M9908 Segmental and somatic dysfunction of rib cage: Secondary | ICD-10-CM | POA: Diagnosis not present

## 2021-08-16 DIAGNOSIS — M9904 Segmental and somatic dysfunction of sacral region: Secondary | ICD-10-CM

## 2021-08-16 DIAGNOSIS — M9901 Segmental and somatic dysfunction of cervical region: Secondary | ICD-10-CM | POA: Diagnosis not present

## 2021-08-16 DIAGNOSIS — M9903 Segmental and somatic dysfunction of lumbar region: Secondary | ICD-10-CM

## 2021-08-16 NOTE — Patient Instructions (Signed)
Compression socks with work ?Keep wearing good shoes ?Avoid being barefoot ?See you again in 2-3 months ?

## 2021-08-16 NOTE — Assessment & Plan Note (Signed)
Continues to have some mild inflammation noted in the area.  Patient also has inflammation of the overlying fat pad.  Discussed with patient about icing regimen, home exercises, discussed potential continued issues.  Patient declined any injection or formal physical therapy.  Follow-up again in 4 to 8 weeks otherwise. ?

## 2021-08-16 NOTE — Assessment & Plan Note (Signed)
Chronic problem with exacerbation.  Patient could be potentially having a different gait secondary to the discomfort and pain.  Discussed icing regimen and home exercises, discussed which activities to do and which ones to avoid, increase activity slowly.  Follow-up again in 6 to 8 weeks ?

## 2021-08-31 ENCOUNTER — Other Ambulatory Visit: Payer: Self-pay

## 2021-09-06 NOTE — Progress Notes (Signed)
? ? ?I,Elena D DeSanto,acting as a scribe for Angela Bacigalupo, MD.,have documented all relevant documentation on the behalf of Angela Bacigalupo, MD,as directed by  Angela Bacigalupo, MD while in the presence of Angela Bacigalupo, MD. ? ? ?Complete physical exam ? ? ?[name]   DOB: 01/17/1971   50 y.o. Female  MRN: [id number] ?Visit Date: 09/09/2021 ? ?Today's healthcare provider: Angela Bacigalupo, MD  ? ?Chief Complaint  ?Patient presents with  ? Annual Exam  ? ?Subjective  ?  ?Kendra [name] is a 50 y.o. female who presents today for a complete physical exam.  ?She reports consuming a general diet.  Patient does exercise 4-5 days weeklyShe generally feels well. She reports sleeping well. She does not have additional problems to discuss today.  ?HPI  ?Colonoscopy-07/22/022-repeat yearly ? ?Past Medical History:  ?Diagnosis Date  ? Allergy   ? Breast cancer ([facility]) left  ? 2004 with chemo tx  ? Osteoporosis   ? Personal history of chemotherapy   ? ?Past Surgical History:  ?Procedure Laterality Date  ? BREAST SURGERY    ? implant left in 2004, and biopsy  ? COLONOSCOPY WITH PROPOFOL N/A 12/31/2020  ? Procedure: COLONOSCOPY WITH PROPOFOL;  Surgeon: Tahiliani, Varnita B, MD;  Location: [facility] ENDOSCOPY;  Service: Gastroenterology;  Laterality: N/A;  ? MASTECTOMY Left 01/07/03  ? with tram flap and right breast lift in 2008  ? TOTAL ABDOMINAL HYSTERECTOMY    ? related to breast cancer  ? TOTAL ABDOMINAL HYSTERECTOMY W/ BILATERAL SALPINGOOPHORECTOMY  10/2003  ? ?Social History  ? ?Socioeconomic History  ? Marital status: Married  ?  Spouse name: Ronald Farrey  ? Number of children: 2  ? Years of education: Not on file  ? Highest education level: Not on file  ?Occupational History  ? Not on file  ?Tobacco Use  ? Smoking status: Never  ? Smokeless tobacco: Never  ?Vaping Use  ? Vaping Use: Never used  ?Substance and Sexual Activity  ? Alcohol use: Yes  ?  Alcohol/week: 2.0 standard drinks  ?  Types: 2 Glasses of  wine per week  ? Drug use: No  ? Sexual activity: Yes  ?  Birth control/protection: Other-see comments  ?  Comment: had total hysterectomy  ?Other Topics Concern  ? Not on file  ?Social History Narrative  ? Not on file  ? ?Social Determinants of Health  ? ?Financial Resource Strain: Not on file  ?Food Insecurity: Not on file  ?Transportation Needs: Not on file  ?Physical Activity: Not on file  ?Stress: Not on file  ?Social Connections: Not on file  ?Intimate Partner Violence: Not on file  ? ?Family Status  ?Relation Name Status  ? Mother  Alive  ? Father  Deceased at age 59  ?     lung cancer  ? Brother  Alive  ? ?Family History  ?Problem Relation Age of Onset  ? Arthritis Mother   ? Hypothyroidism Mother   ? Breast cancer Mother 53  ? Alcohol abuse Brother   ? ?No Known Allergies  ?Patient Care Team: ?Bacigalupo, Angela M, MD as PCP - General (Family Medicine)  ? ?Medications: ?Outpatient Medications Prior to Visit  ?Medication Sig  ? alendronate (FOSAMAX) 70 MG tablet Take 1 tablet (70 mg total) by mouth every 7 (seven) days. Take with a full glass of water on an empty stomach.  ? Calcium-Vitamin D 600-200 MG-UNIT per tablet Take 1 tablet by mouth daily.   ? cyclobenzaprine (  FLEXERIL) 10 MG tablet Take 1 tablet (10 mg total) by mouth 3 (three) times daily as needed for muscle spasms.  ? fluticasone (FLONASE) 50 MCG/ACT nasal spray PLACE 1 SPRAY INTO BOTH NOSTRILS DAILY.  ? loratadine-pseudoephedrine (CLARITIN-D 24 HOUR) 10-240 MG 24 hr tablet Take 1 tablet by mouth daily.  ? valACYclovir (VALTREX) 1000 MG tablet Take 2 tablets (2,000 mg total) by mouth 2 (two) times daily. At onset of cold sore  ? [DISCONTINUED] COVID-19 At Home Antigen Test [facility] COVID-19 HOME TEST) KIT Use as directed  ? [DISCONTINUED] gabapentin (NEURONTIN) 100 MG capsule Take 2 capsules (200 mg total) by mouth 3 (three) times daily.  ? [DISCONTINUED] ibuprofen (ADVIL) 600 MG tablet Take 1 tablet 3 times a day by oral route.  ? ?No  facility-administered medications prior to visit.  ? ? ?Review of Systems  ?Constitutional: Negative.   ?HENT: Negative.    ?Eyes: Negative.   ?Respiratory: Negative.    ?Cardiovascular: Negative.   ?Gastrointestinal: Negative.   ?Endocrine: Negative.   ?Genitourinary: Negative.   ?Musculoskeletal: Negative.   ?Skin:  Positive for color change.  ?Allergic/Immunologic: Negative.   ?Neurological: Negative.   ?Hematological: Negative.   ?Psychiatric/Behavioral: Negative.    ? ?  ? Objective  ?  ?BP 104/68 (BP Location: Right Arm, Patient Position: Sitting, Cuff Size: Normal)   Pulse 76   Temp 97.7 ?F (36.5 ?C) (Oral)   Ht 5' 7" (1.702 m)   Wt 150 lb (68 kg)   SpO2 100%   BMI 23.49 kg/m?  ?  ? ? ?Physical Exam ?Vitals reviewed.  ?Constitutional:   ?   General: She is not in acute distress. ?   Appearance: Normal appearance. She is well-developed. She is not diaphoretic.  ?HENT:  ?   Head: Normocephalic and atraumatic.  ?   Right Ear: Tympanic membrane, ear canal and external ear normal.  ?   Left Ear: Tympanic membrane, ear canal and external ear normal.  ?   Nose: Nose normal.  ?   Mouth/Throat:  ?   Mouth: Mucous membranes are moist.  ?   Pharynx: Oropharynx is clear. No oropharyngeal exudate.  ?Eyes:  ?   General: No scleral icterus. ?   Conjunctiva/sclera: Conjunctivae normal.  ?   Pupils: Pupils are equal, round, and reactive to light.  ?Neck:  ?   Thyroid: No thyromegaly.  ?Cardiovascular:  ?   Rate and Rhythm: Normal rate and regular rhythm.  ?   Pulses: Normal pulses.  ?   Heart sounds: Normal heart sounds. No murmur heard. ?Pulmonary:  ?   Effort: Pulmonary effort is normal. No respiratory distress.  ?   Breath sounds: Normal breath sounds. No wheezing or rales.  ?Abdominal:  ?   General: There is no distension.  ?   Palpations: Abdomen is soft.  ?   Tenderness: There is no abdominal tenderness.  ?Musculoskeletal:     ?   General: No deformity.  ?   Cervical back: Neck supple.  ?   Right lower leg: No  edema.  ?   Left lower leg: No edema.  ?Lymphadenopathy:  ?   Cervical: No cervical adenopathy.  ?Skin: ?   General: Skin is warm and dry.  ?Neurological:  ?   Mental Status: She is alert and oriented to person, place, and time. Mental status is at baseline.  ?   Gait: Gait normal.  ?Psychiatric:     ?   Mood and Affect: Mood normal.     ?  Behavior: Behavior normal.     ?   Thought Content: Thought content normal.  ?  ? ? ?Last depression screening scores ? ?  09/09/2021  ?  8:46 AM 09/03/2020  ?  8:20 AM 08/28/2019  ?  7:25 AM  ?PHQ 2/9 Scores  ?PHQ - 2 Score 0 0 0  ?PHQ- 9 Score 0 0 3  ? ?Last fall risk screening ? ?  09/09/2021  ?  8:45 AM  ?Fall Risk   ?Falls in the past year? 0  ? ?Last Audit-C alcohol use screening ? ?  09/09/2021  ?  8:45 AM  ?Alcohol Use Disorder Test (AUDIT)  ?1. How often do you have a drink containing alcohol? 2  ?2. How many drinks containing alcohol do you have on a typical day when you are drinking? 0  ?3. How often do you have six or more drinks on one occasion? 0  ?AUDIT-C Score 2  ? ?A score of 3 or more in women, and 4 or more in men indicates increased risk for alcohol abuse, EXCEPT if all of the points are from question 1  ? ?No results found for any visits on 09/09/21. ? Assessment & Plan  ?  ?Routine Health Maintenance and Physical Exam ? ?Exercise Activities and Dietary recommendations ? Goals   ? ?  Exercise 150 minutes per week (moderate activity)   ? ?  ? ? ?Immunization History  ?Administered Date(s) Administered  ? Influenza,inj,Quad PF,6+ Mos 03/13/2015  ? Influenza-Unspecified 02/19/2017, 04/07/2018, 04/06/2021  ? PFIZER(Purple Top)SARS-COV-2 Vaccination 07/03/2019, 07/22/2019  ? ? ?Health Maintenance  ?Topic Date Due  ? TETANUS/TDAP  Never done  ? Zoster Vaccines- Shingrix (1 of 2) Never done  ? COVID-19 Vaccine (3 - Pfizer risk series) 08/19/2019  ? COLONOSCOPY (Pts 45-78yr Insurance coverage will need to be confirmed)  12/31/2021  ? MAMMOGRAM  09/30/2022  ? INFLUENZA  VACCINE  Completed  ? Hepatitis C Screening  Completed  ? HIV Screening  Completed  ? HPV VACCINES  Aged Out  ? ? ?Discussed health benefits of physical activity, and encouraged her to engage in regular exercise appropri

## 2021-09-09 ENCOUNTER — Ambulatory Visit (INDEPENDENT_AMBULATORY_CARE_PROVIDER_SITE_OTHER): Payer: 59 | Admitting: Family Medicine

## 2021-09-09 ENCOUNTER — Encounter: Payer: Self-pay | Admitting: Family Medicine

## 2021-09-09 VITALS — BP 104/68 | HR 76 | Temp 97.7°F | Ht 67.0 in | Wt 150.0 lb

## 2021-09-09 DIAGNOSIS — Z1231 Encounter for screening mammogram for malignant neoplasm of breast: Secondary | ICD-10-CM

## 2021-09-09 DIAGNOSIS — Z0000 Encounter for general adult medical examination without abnormal findings: Secondary | ICD-10-CM | POA: Diagnosis not present

## 2021-09-09 DIAGNOSIS — Z853 Personal history of malignant neoplasm of breast: Secondary | ICD-10-CM

## 2021-09-10 LAB — COMPREHENSIVE METABOLIC PANEL
ALT: 13 IU/L (ref 0–32)
AST: 16 IU/L (ref 0–40)
Albumin/Globulin Ratio: 2.1 (ref 1.2–2.2)
Albumin: 4.4 g/dL (ref 3.8–4.9)
Alkaline Phosphatase: 42 IU/L — ABNORMAL LOW (ref 44–121)
BUN/Creatinine Ratio: 17 (ref 9–23)
BUN: 15 mg/dL (ref 6–24)
Bilirubin Total: 0.7 mg/dL (ref 0.0–1.2)
CO2: 28 mmol/L (ref 20–29)
Calcium: 9.6 mg/dL (ref 8.7–10.2)
Chloride: 101 mmol/L (ref 96–106)
Creatinine, Ser: 0.86 mg/dL (ref 0.57–1.00)
Globulin, Total: 2.1 g/dL (ref 1.5–4.5)
Glucose: 91 mg/dL (ref 70–99)
Potassium: 4.8 mmol/L (ref 3.5–5.2)
Sodium: 138 mmol/L (ref 134–144)
Total Protein: 6.5 g/dL (ref 6.0–8.5)
eGFR: 82 mL/min/{1.73_m2} (ref >59)

## 2021-09-10 LAB — CBC
Hematocrit: 39 % (ref 34.0–46.6)
Hemoglobin: 13.1 g/dL (ref 11.1–15.9)
MCH: 31 pg (ref 26.6–33.0)
MCHC: 33.6 g/dL (ref 31.5–35.7)
MCV: 92 fL (ref 79–97)
Platelets: 170 10*3/uL (ref 150–450)
RBC: 4.22 x10E6/uL (ref 3.77–5.28)
RDW: 11.9 % (ref 11.7–15.4)
WBC: 4.6 10*3/uL (ref 3.4–10.8)

## 2021-09-10 LAB — LIPID PANEL
Chol/HDL Ratio: 3.5 ratio (ref 0.0–4.4)
Cholesterol, Total: 191 mg/dL (ref 100–199)
HDL: 54 mg/dL (ref >39)
LDL Chol Calc (NIH): 123 mg/dL — ABNORMAL HIGH (ref 0–99)
Triglycerides: 74 mg/dL (ref 0–149)
VLDL Cholesterol Cal: 14 mg/dL (ref 5–40)

## 2021-09-10 LAB — CANCER ANTIGEN 27.29: CA 27.29: <9 U/mL (ref 0.0–38.6)

## 2021-09-10 LAB — VITAMIN D 25 HYDROXY (VIT D DEFICIENCY, FRACTURES): Vit D, 25-Hydroxy: 47.4 ng/mL (ref 30.0–100.0)

## 2021-09-10 LAB — TSH: TSH: 2.27 u[IU]/mL (ref 0.450–4.500)

## 2021-09-22 ENCOUNTER — Encounter: Payer: Self-pay | Admitting: Family Medicine

## 2021-09-22 DIAGNOSIS — I83813 Varicose veins of bilateral lower extremities with pain: Secondary | ICD-10-CM

## 2021-09-23 NOTE — Telephone Encounter (Signed)
Ok to place referral for Vasc surg in [place] for varicose veins (unspecified)

## 2021-10-04 ENCOUNTER — Other Ambulatory Visit: Payer: Self-pay

## 2021-10-04 ENCOUNTER — Other Ambulatory Visit: Payer: Self-pay | Admitting: Family Medicine

## 2021-10-04 ENCOUNTER — Encounter: Payer: Self-pay | Admitting: Family Medicine

## 2021-10-04 DIAGNOSIS — M818 Other osteoporosis without current pathological fracture: Secondary | ICD-10-CM

## 2021-10-04 MED FILL — Alendronate Sodium Tab 70 MG: ORAL | 84 days supply | Qty: 12 | Fill #0 | Status: AC

## 2021-10-13 ENCOUNTER — Other Ambulatory Visit: Payer: Self-pay

## 2021-10-13 DIAGNOSIS — I8390 Asymptomatic varicose veins of unspecified lower extremity: Secondary | ICD-10-CM

## 2021-10-14 ENCOUNTER — Ambulatory Visit (HOSPITAL_COMMUNITY)
Admission: RE | Admit: 2021-10-14 | Discharge: 2021-10-14 | Disposition: A | Discharge status: Home / Self Care | Payer: 59 | Source: Ambulatory Visit | Attending: Vascular Surgery | Admitting: Vascular Surgery

## 2021-10-14 DIAGNOSIS — I8390 Asymptomatic varicose veins of unspecified lower extremity: Secondary | ICD-10-CM | POA: Diagnosis not present

## 2021-10-19 ENCOUNTER — Other Ambulatory Visit: Payer: Self-pay

## 2021-10-19 MED ORDER — COVID-19 AT HOME ANTIGEN TEST VI KIT
PACK | 0 refills | Status: DC
  Filled 2021-10-19: qty 2, 4d supply, fill #0

## 2021-10-31 NOTE — Progress Notes (Unsigned)
Kendra [name] [place] [address] [place] Phone: [phone] Subjective:   IVilma [name], am serving as a scribe for Dr. Hulan Saas. This visit occurred during the SARS-CoV-2 public health emergency.  Safety protocols were in place, including screening questions prior to the visit, additional usage of staff PPE, and extensive cleaning of exam room while observing appropriate contact time as indicated for disinfecting solutions.   I'm seeing this patient by the request  of:  Bacigalupo, Dionne Bucy, MD  CC: Back and neck pain follow-up  [id number]  Kendra [name] is a 51 y.o. female coming in with complaint of back and neck pain. OM T3/12/2021. Patient states same per usual. No new complaints.  Has had some tightness recently.  A little more stress at work as well as at home.  Patient is working out though on a regular basis and feels like that is a little helpful thing she can do.  No radiation down the leg but still has the chronic discomfort in the right lower back.  Medications patient has been prescribed: None  Taking:         Reviewed prior external information including notes and imaging from previsou exam, outside providers and external EMR if available.   As well as notes that were available from care everywhere and other healthcare systems.  Past medical history, social, surgical and family history all reviewed in electronic medical record.  No pertanent information unless stated regarding to the chief complaint.   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 POSITIVE muscle aches  Objective  Blood pressure 124/82, pulse 76, height '5\' 7"'$  (1.702 m), weight 155 lb (70.3 kg), SpO2 98 %.   General: No apparent distress alert and oriented x3 mood and affect normal, dressed appropriately.  HEENT: Pupils equal, extraocular movements intact  Respiratory: Patient's speak in full sentences and does not appear short of breath  Cardiovascular: No lower extremity edema, non tender, no erythema  Low back exam does have some loss of lordosis.  Patient does have some tenderness to palpation in the paraspinal musculature.  Tightness with right Corky Sox test.  Tightness in the right gluteal area as well.  Negative straight leg test noted today though.  Osteopathic findings  C2 flexed rotated and side bent right C6 flexed rotated and side bent left T3 extended rotated and side bent right inhaled rib T9 extended rotated and side bent left L2 flexed rotated and side bent right Sacrum right on right       Assessment and Plan:  Lumbar radiculopathy Chronic, stable.  Responding well to osteopathic manipulation.  Patient is still working on core strength and gluteal strength which has been more beneficial.  Avoiding excessive extension of the back.  Increase activity slowly.  Follow-up with me again in 6 to 8 weeks.  Plantar fasciitis of left foot Patient is improving and seems to be improving with some of the gabapentin.  Encouraged her to continue that on a regular basis at the moment.  Patient's does have some hemosiderin deposits follow-up with  me again in 6 to 8 weeks and will discuss further   Nonallopathic problems  Decision today to treat with OMT was based on Physical Exam  After verbal consent patient was treated with HVLA, ME, FPR techniques in cervical, rib, thoracic, lumbar, and sacral  areas  Patient tolerated the procedure well with improvement in symptoms  Patient given exercises, stretches and lifestyle  modifications  See medications in patient instructions if given  Patient will follow up in 4-8 weeks      The above documentation has been reviewed and is accurate and complete Kendra Pulley, DO        Note: This dictation was prepared with Dragon dictation along with smaller phrase technology. Any transcriptional errors that result from this process are unintentional.

## 2021-11-01 ENCOUNTER — Ambulatory Visit (INDEPENDENT_AMBULATORY_CARE_PROVIDER_SITE_OTHER): Payer: 59 | Admitting: Family Medicine

## 2021-11-01 VITALS — BP 124/82 | HR 76 | Ht 67.0 in | Wt 155.0 lb

## 2021-11-01 DIAGNOSIS — M722 Plantar fascial fibromatosis: Secondary | ICD-10-CM

## 2021-11-01 DIAGNOSIS — M9901 Segmental and somatic dysfunction of cervical region: Secondary | ICD-10-CM | POA: Diagnosis not present

## 2021-11-01 DIAGNOSIS — M9903 Segmental and somatic dysfunction of lumbar region: Secondary | ICD-10-CM | POA: Diagnosis not present

## 2021-11-01 DIAGNOSIS — M9908 Segmental and somatic dysfunction of rib cage: Secondary | ICD-10-CM | POA: Diagnosis not present

## 2021-11-01 DIAGNOSIS — M9904 Segmental and somatic dysfunction of sacral region: Secondary | ICD-10-CM | POA: Diagnosis not present

## 2021-11-01 DIAGNOSIS — M9902 Segmental and somatic dysfunction of thoracic region: Secondary | ICD-10-CM

## 2021-11-01 DIAGNOSIS — M5416 Radiculopathy, lumbar region: Secondary | ICD-10-CM

## 2021-11-01 NOTE — Assessment & Plan Note (Signed)
Chronic, stable.  Responding well to osteopathic manipulation.  Patient is still working on core strength and gluteal strength which has been more beneficial.  Avoiding excessive extension of the back.  Increase activity slowly.  Follow-up with me again in 6 to 8 weeks.

## 2021-11-01 NOTE — Patient Instructions (Signed)
Good to see you! Happy with all the potential changes See you again in 6-8 weeks

## 2021-11-01 NOTE — Assessment & Plan Note (Signed)
Patient is improving and seems to be improving with some of the gabapentin.  Encouraged her to continue that on a regular basis at the moment.  Patient's does have some hemosiderin deposits follow-up with me again in 6 to 8 weeks and will discuss further

## 2021-11-02 ENCOUNTER — Encounter: Payer: Self-pay | Admitting: Vascular Surgery

## 2021-11-02 ENCOUNTER — Encounter: Payer: Self-pay | Admitting: Family Medicine

## 2021-11-02 ENCOUNTER — Ambulatory Visit (INDEPENDENT_AMBULATORY_CARE_PROVIDER_SITE_OTHER): Payer: 59 | Admitting: Vascular Surgery

## 2021-11-02 VITALS — BP 115/74 | HR 67 | Temp 98.1°F | Resp 20 | Ht 67.0 in | Wt 155.0 lb

## 2021-11-02 DIAGNOSIS — R233 Spontaneous ecchymoses: Secondary | ICD-10-CM | POA: Diagnosis not present

## 2021-11-02 NOTE — Progress Notes (Signed)
Patient ID: Kendra [name], female   DOB: 04-16-1971, 51 y.o.   MRN: [id number]  Reason for Consult: New Patient (Initial Visit)   Referred by Virginia Crews, MD  Subjective:     HPI:  Kendra [name] is a 51 y.o. female without any previous history of vascular disease.  Specifically denies any history of DVTs.  Last November she developed skin lesions on the left ankle this is now spread to the bilateral legs with concern for leaking capillaries.  She has been wearing compression socks but these have not really helped.  She has associated burning pain at the site of the lesions.  She does walk.  She continues to exercise daily.  She does not have any leg swelling.  She does not have a raised rashes or other lesions.  She does not have this outside of her lower legs.  There is no tissue loss or ulceration.  She does have associated telangiectasias that have been present since high school.  Past Medical History:  Diagnosis Date   Allergy    Breast cancer ([place]) left   2004 with chemo tx   Osteoporosis    Personal history of chemotherapy    Family History  Problem Relation Age of Onset   Arthritis Mother    Hypothyroidism Mother    Breast cancer Mother 2   Alcohol abuse Brother    Past Surgical History:  Procedure Laterality Date   BREAST SURGERY     implant left in 2004, and biopsy   COLONOSCOPY WITH PROPOFOL N/A 12/31/2020   Procedure: COLONOSCOPY WITH PROPOFOL;  Surgeon: Virgel Manifold, MD;  Location: [facility] ENDOSCOPY;  Service: Gastroenterology;  Laterality: N/A;   MASTECTOMY Left 01/07/03   with tram flap and right breast lift in 2008   [place]     related to breast cancer   TOTAL ABDOMINAL HYSTERECTOMY W/ BILATERAL SALPINGOOPHORECTOMY  10/2003    Short Social History:  Social History   Tobacco Use   Smoking status: Never   Smokeless tobacco: Never  Substance Use Topics   Alcohol use: Yes    Alcohol/week: 2.0 standard drinks    Types: 2  Glasses of wine per week    No Known Allergies  Current Outpatient Medications  Medication Sig Dispense Refill   alendronate (FOSAMAX) 70 MG tablet Take 1 tablet (70 mg total) by mouth every 7 (seven) days. Take with a full glass of water on an empty stomach. 12 tablet 3   Calcium-Vitamin D 600-200 MG-UNIT per tablet Take 1 tablet by mouth daily.      cyclobenzaprine (FLEXERIL) 10 MG tablet Take 1 tablet (10 mg total) by mouth 3 (three) times daily as needed for muscle spasms. 90 tablet 1   fluticasone (FLONASE) 50 MCG/ACT nasal spray PLACE 1 SPRAY INTO BOTH NOSTRILS DAILY. 16 g 11   loratadine-pseudoephedrine (CLARITIN-D 24 HOUR) 10-240 MG 24 hr tablet Take 1 tablet by mouth daily. 90 tablet 1   valACYclovir (VALTREX) 1000 MG tablet Take 2 tablets (2,000 mg total) by mouth 2 (two) times daily. At onset of cold sore 20 tablet 6   No current facility-administered medications for this visit.    Review of Systems  Constitutional:  Constitutional negative. HENT: HENT negative.  Eyes: Eyes negative.  Respiratory: Respiratory negative.  Cardiovascular: Cardiovascular negative.  GI: Gastrointestinal negative.  Musculoskeletal: Musculoskeletal negative.  Skin: Positive for rash.  Neurological: Neurological negative. Hematologic: Hematologic/lymphatic negative.  Psychiatric: Psychiatric negative.  Objective:  Objective   Vitals:   11/02/21 0820  BP: 115/74  Pulse: 67  Resp: 20  Temp: 98.1 F (36.7 C)  SpO2: 98%  Weight: 155 lb (70.3 kg)  Height: '5\' 7"'$  (1.702 m)   Body mass index is 24.28 kg/m.  Physical Exam HENT:     Head: Normocephalic.     Nose: Nose normal.  Eyes:     Pupils: Pupils are equal, round, and reactive to light.  Cardiovascular:     Rate and Rhythm: Normal rate.     Pulses: Normal pulses.  Abdominal:     General: Abdomen is flat.     Palpations: Abdomen is soft.  Musculoskeletal:     Cervical back: Normal range of motion.  Skin:    Capillary  Refill: Capillary refill takes less than 2 seconds.     Comments: There are what appear to be petechial hemorrhages throughout the bilateral lower extremities.  There is 1 small area of telangiectasias on the left medial leg.  Neurological:     General: No focal deficit present.     Mental Status: She is alert.  Psychiatric:        Mood and Affect: Mood normal.        Behavior: Behavior normal.        Thought Content: Thought content normal.        Judgment: Judgment normal.    Data: Venous Reflux Times  +--------------+---------+------+-----------+------------+--------+  RIGHT         Reflux NoRefluxReflux TimeDiameter cmsComments                          Yes                                   +--------------+---------+------+-----------+------------+--------+  CFV                     yes   >1 second                       +--------------+---------+------+-----------+------------+--------+  FV mid        no                                              +--------------+---------+------+-----------+------------+--------+  Popliteal     no                                              +--------------+---------+------+-----------+------------+--------+  GSV at SFJ              yes    >500 ms      0.46              +--------------+---------+------+-----------+------------+--------+  GSV prox thighno                            0.40              +--------------+---------+------+-----------+------------+--------+  GSV mid thigh no  0.41              +--------------+---------+------+-----------+------------+--------+  GSV dist thighno                            0.38              +--------------+---------+------+-----------+------------+--------+  GSV at knee   no                            0.36              +--------------+---------+------+-----------+------------+--------+  GSV prox calf            yes    >500 ms      0.24              +--------------+---------+------+-----------+------------+--------+  SSV Pop Fossa no                            0.30              +--------------+---------+------+-----------+------------+--------+  SSV prox calf no                            0.19              +--------------+---------+------+-----------+------------+--------+  SSV mid calf  no                            0.17              +--------------+---------+------+-----------+------------+--------+      +--------------+---------+------+-----------+------------+--------+  LEFT          Reflux NoRefluxReflux TimeDiameter cmsComments                          Yes                                   +--------------+---------+------+-----------+------------+--------+  CFV                     yes   >1 second                       +--------------+---------+------+-----------+------------+--------+  FV mid        no                                              +--------------+---------+------+-----------+------------+--------+  Popliteal     no                                              +--------------+---------+------+-----------+------------+--------+  GSV at [facility]    no                            0.47              +--------------+---------+------+-----------+------------+--------+  GSV prox thighno  0.39              +--------------+---------+------+-----------+------------+--------+  GSV mid thigh no                            0.42              +--------------+---------+------+-----------+------------+--------+  GSV dist thighno                            0.42              +--------------+---------+------+-----------+------------+--------+  GSV at knee   no                            0.37              +--------------+---------+------+-----------+------------+--------+  GSV  prox calf           yes    >500 ms      0.39              +--------------+---------+------+-----------+------------+--------+  SSV Pop Fossa no                            0.21              +--------------+---------+------+-----------+------------+--------+  SSV prox calf no                            0.21              +--------------+---------+------+-----------+------------+--------+  SSV mid calf  no                            0.20              +--------------+---------+------+-----------+------------+--------+           Summary:  Right:  - No evidence of deep vein thrombosis seen in the right lower extremity,  from the common femoral through the popliteal veins.  - No evidence of superficial venous thrombosis in the right lower  extremity.  - Venous reflux is noted in the right common femoral vein.  - Venous reflux is noted in the right sapheno-femoral junction.  - Venous reflux is noted in the right greater saphenous vein in the calf.     Left:  - No evidence of deep vein thrombosis seen in the left lower extremity,  from the common femoral through the popliteal veins.  - No evidence of superficial venous thrombosis in the left lower  extremity.  - Venous reflux is noted in the left common femoral vein.  - Venous reflux is noted in the left greater saphenous vein in the calf.      Assessment/Plan:    51 year old female with what appears to be petechial hemorrhages bilateral lower extremities.  These do not appear to be related to any venous incompetence all of her arterial flow is normal.  Potentially she would benefit from evaluation by hematology.  She can follow-up with me on an as-needed basis.     Waynetta Sandy MD [facility]

## 2021-11-03 ENCOUNTER — Ambulatory Visit: Payer: 59 | Admitting: Family Medicine

## 2021-11-08 ENCOUNTER — Encounter: Payer: Self-pay | Admitting: Family Medicine

## 2021-11-14 ENCOUNTER — Other Ambulatory Visit: Payer: Self-pay

## 2021-11-14 DIAGNOSIS — L817 Pigmented purpuric dermatosis: Secondary | ICD-10-CM | POA: Diagnosis not present

## 2021-11-14 MED ORDER — TRIAMCINOLONE ACETONIDE 0.1 % EX CREA
TOPICAL_CREAM | CUTANEOUS | 1 refills | Status: DC
  Filled 2021-11-14: qty 30, 14d supply, fill #0
  Filled 2021-11-30: qty 30, 14d supply, fill #1

## 2021-11-30 ENCOUNTER — Other Ambulatory Visit: Payer: Self-pay

## 2021-11-30 MED FILL — Alendronate Sodium Tab 70 MG: ORAL | 84 days supply | Qty: 12 | Fill #1 | Status: CN

## 2021-12-02 ENCOUNTER — Ambulatory Visit
Admission: RE | Admit: 2021-12-02 | Discharge: 2021-12-02 | Disposition: A | Discharge status: Home / Self Care | Payer: 59 | Source: Ambulatory Visit | Attending: Family Medicine | Admitting: Family Medicine

## 2021-12-02 DIAGNOSIS — Z1231 Encounter for screening mammogram for malignant neoplasm of breast: Secondary | ICD-10-CM | POA: Diagnosis not present

## 2021-12-02 DIAGNOSIS — Z853 Personal history of malignant neoplasm of breast: Secondary | ICD-10-CM | POA: Insufficient documentation

## 2021-12-09 ENCOUNTER — Other Ambulatory Visit: Payer: Self-pay

## 2021-12-09 MED FILL — Alendronate Sodium Tab 70 MG: ORAL | 84 days supply | Qty: 12 | Fill #1 | Status: AC

## 2021-12-12 ENCOUNTER — Other Ambulatory Visit: Payer: Self-pay

## 2021-12-29 ENCOUNTER — Encounter: Payer: Self-pay | Admitting: Family Medicine

## 2021-12-29 ENCOUNTER — Other Ambulatory Visit: Payer: Self-pay

## 2021-12-29 MED ORDER — MELOXICAM 15 MG PO TABS
15.0000 mg | ORAL_TABLET | Freq: Every day | ORAL | 0 refills | Status: DC
  Filled 2021-12-29: qty 30, 30d supply, fill #0

## 2022-01-05 NOTE — Progress Notes (Signed)
Kendra [name] [address] [place] Phone: [phone] Subjective:   [name], am serving as a scribe for Dr. Hulan Saas.  I'm seeing this patient by the request  of:  Bacigalupo, Dionne Bucy, MD  CC: Foot pain follow-up, back pain  [id number]  Kendra [name] is a 51 y.o. female coming in with complaint of back and neck pain. OMT on 11/01/2021. Also having some heel pain. Patient states meloxicam has helped with heel pain.  Medications patient has been prescribed: Mobic  Taking:         Reviewed prior external information including notes and imaging from previsou exam, outside providers and external EMR if available.   As well as notes that were available from care everywhere and other healthcare systems.  Past medical history, social, surgical and family history all reviewed in electronic medical record.  No pertanent information unless stated regarding to the chief complaint.   Past Medical History:  Diagnosis Date   Allergy    Breast cancer ([place]) left   2004 with chemo tx   Osteoporosis    Personal history of chemotherapy     No Known Allergies   Review of Systems:  No headache, visual changes, nausea, vomiting, diarrhea, constipation, dizziness, abdominal pain, skin rash, fevers, chills, night sweats, weight loss, swollen lymph nodes, body aches, joint swelling, chest pain, shortness of breath, mood changes. POSITIVE muscle aches  Objective  Blood pressure 138/80, pulse 72, height '5\' 7"'$  (1.702 m), weight 160 lb (72.6 kg), SpO2 98 %.   General: No apparent distress alert and oriented x3 mood and affect normal, dressed appropriately.  HEENT: Pupils equal, extraocular movements intact  Respiratory: Patient's speak in full sentences and does not appear short of breath  Cardiovascular: No lower extremity edema, non tender, no erythema  MSK:  Back does have some loss of lordosis.  Some tenderness to palpation.   Mostly seems to be on the right greater than left.  Seems to be in the piriformis as well as the sacroiliac joint. Left hip exam shows the patient does have healing noted which seems to be more of the fat pad than anything else.  Some tightness of the posterior capsule of the ankle noted.  Patient no Achilles is intact.  Osteopathic findings  C7 flexed rotated and side bent left T3 extended rotated and side bent right inhaled rib L3 flexed rotated and side bent right Sacrum right on right       Assessment and Plan:  Plantar fasciitis of left foot Continued swelling of the left heel noted.  Patient given colchicine and prednisone to see if this would be very helpful.  Discussed which activities to do and which ones to avoid.  Increase activity slowly otherwise.  Follow-up with me again in 6 to 8 weeks.  May need to consider further work-up  Lumbar radiculopathy Seems to be stable at this time.  Responding extremely well though to osteopathic manipulation.  Does have the underlying osteoporosis and we will continue to monitor as well.  Discussed posture and ergonomics otherwise.  Follow-up again in 6 to 8 weeks.    Nonallopathic problems  Decision today to treat with OMT was based on Physical Exam  After verbal consent patient was treated with HVLA, ME, FPR techniques in cervical, rib, thoracic, lumbar, and sacral  areas  Patient tolerated the procedure well with improvement in symptoms  Patient given exercises, stretches and lifestyle modifications  See medications  in patient instructions if given  Patient will follow up in 4-8 weeks    The above documentation has been reviewed and is accurate and complete Kendra Pulley, DO          Note: This dictation was prepared with Dragon dictation along with smaller phrase technology. Any transcriptional errors that result from this process are unintentional.

## 2022-01-06 ENCOUNTER — Other Ambulatory Visit: Payer: Self-pay

## 2022-01-06 ENCOUNTER — Ambulatory Visit (INDEPENDENT_AMBULATORY_CARE_PROVIDER_SITE_OTHER): Payer: 59 | Admitting: Family Medicine

## 2022-01-06 ENCOUNTER — Ambulatory Visit: Payer: Self-pay

## 2022-01-06 VITALS — BP 138/80 | HR 72 | Ht 67.0 in | Wt 160.0 lb

## 2022-01-06 DIAGNOSIS — M9902 Segmental and somatic dysfunction of thoracic region: Secondary | ICD-10-CM

## 2022-01-06 DIAGNOSIS — M9901 Segmental and somatic dysfunction of cervical region: Secondary | ICD-10-CM

## 2022-01-06 DIAGNOSIS — M722 Plantar fascial fibromatosis: Secondary | ICD-10-CM

## 2022-01-06 DIAGNOSIS — M9903 Segmental and somatic dysfunction of lumbar region: Secondary | ICD-10-CM | POA: Diagnosis not present

## 2022-01-06 DIAGNOSIS — M79672 Pain in left foot: Secondary | ICD-10-CM

## 2022-01-06 DIAGNOSIS — M9904 Segmental and somatic dysfunction of sacral region: Secondary | ICD-10-CM | POA: Diagnosis not present

## 2022-01-06 DIAGNOSIS — M5416 Radiculopathy, lumbar region: Secondary | ICD-10-CM

## 2022-01-06 DIAGNOSIS — M9908 Segmental and somatic dysfunction of rib cage: Secondary | ICD-10-CM | POA: Diagnosis not present

## 2022-01-06 MED ORDER — PREDNISONE 20 MG PO TABS
20.0000 mg | ORAL_TABLET | Freq: Every day | ORAL | 0 refills | Status: DC
  Filled 2022-01-06: qty 5, 5d supply, fill #0

## 2022-01-06 MED ORDER — COLCHICINE 0.6 MG PO TABS
0.6000 mg | ORAL_TABLET | Freq: Two times a day (BID) | ORAL | 0 refills | Status: DC
  Filled 2022-01-06: qty 10, 5d supply, fill #0

## 2022-01-06 NOTE — Assessment & Plan Note (Signed)
Seems to be stable at this time.  Responding extremely well though to osteopathic manipulation.  Does have the underlying osteoporosis and we will continue to monitor as well.  Discussed posture and ergonomics otherwise.  Follow-up again in 6 to 8 weeks.

## 2022-01-06 NOTE — Assessment & Plan Note (Signed)
Continued swelling of the left heel noted.  Patient given colchicine and prednisone to see if this would be very helpful.  Discussed which activities to do and which ones to avoid.  Increase activity slowly otherwise.  Follow-up with me again in 6 to 8 weeks.  May need to consider further work-up

## 2022-01-06 NOTE — Patient Instructions (Signed)
Prednisone for 5 days Colchicine for 5 days See me in 5-6 weeks

## 2022-01-31 ENCOUNTER — Encounter: Payer: Self-pay | Admitting: Family Medicine

## 2022-02-09 NOTE — Progress Notes (Signed)
Kendra [name] [address] [place] Phone: [phone] Subjective:   [name], am serving as a scribe for Dr. Hulan Saas.  I'm seeing this patient by the request  of:  Bacigalupo, Dionne Bucy, MD  CC: back and neck pain   [id number]  Kendra [name] is a 51 y.o. female coming in with complaint of back and neck pain. OMT 01/06/2022. Also f/u for L heel pain. Colchicine prescribed. Patient states doing well. Heel pain has not gone away. Isn't able to do certain cardio. NO other complaints  Medications patient has been prescribed:   Taking:       Reviewed prior external information including notes and imaging from previsou exam, outside providers and external EMR if available.   As well as notes that were available from care everywhere and other healthcare systems.  Past medical history, social, surgical and family history all reviewed in electronic medical record.  No pertanent information unless stated regarding to the chief complaint.   Past Medical History:  Diagnosis Date   Allergy    Breast cancer ([place]) left   2004 with chemo tx   Osteoporosis    Personal history of chemotherapy     No Known Allergies   Review of Systems:  No headache, visual changes, nausea, vomiting, diarrhea, constipation, dizziness, abdominal pain, skin rash, fevers, chills, night sweats, weight loss, swollen lymph nodes, body aches, joint swelling, chest pain, shortness of breath, mood changes. POSITIVE muscle aches  Objective  Blood pressure 120/84, pulse 84, height '5\' 7"'$  (1.702 m), weight 160 lb (72.6 kg), SpO2 99 %.   General: No apparent distress alert and oriented x3 mood and affect normal, dressed appropriately.  HEENT: Pupils equal, extraocular movements intact  Respiratory: Patient's speak in full sentences and does not appear short of breath  Cardiovascular: No lower extremity edema, non tender, no erythema  Gait antalgic  MSK:   Back low back exam does have some loss of lordosis. Tenderness to palpation over the sacroiliac joint and the gluteal tendons bilaterally.  Worsening pain with extension of the back.  Osteopathic findings  C7 flexed rotated and side bent left T3 extended rotated and side bent right inhaled rib L1 flexed rotated and side bent right Sacrum left on left  Procedure: Real-time Ultrasound Guided Injection of left plantar fascia with Device: GE Logiq Q7 Ultrasound guided injection is preferred based studies that show increased duration, increased effect, greater accuracy, decreased procedural pain, increased response rate, and decreased cost with ultrasound guided versus blind injection.  Verbal informed consent obtained.  Time-out conducted.  Noted no overlying erythema, induration, or other signs of local infection.  Skin prepped in a sterile fashion.  Local anesthesia: Topical Ethyl chloride.  With sterile technique and under real time ultrasound guidance: With a 25-gauge half inch needle patient was injected into the plantar fascia from the medial calcaneal area.  Total of 0.5 cc of 0.5% Marcaine and 0.5 cc of Kenalog 40 mg/mL used. Completed without difficulty  Pain immediately resolved suggesting accurate placement of the medication.  Advised to call if fevers/chills, erythema, induration, drainage, or persistent bleeding.  Impression: Technically successful ultrasound guided injection.     Assessment and Plan:  Plantar fasciitis of left foot Patient failed all other conservative therapy at this time.  Did not make any significant improvement with colchicine.  Still did not have any enlargement of the plantar fascia but did on the ultrasound appeared to have some  partial tearing noted.  Injection given today and hopefully patient will make some improvement.  Did discuss the possibility of advanced imaging if patient fails this or the possibility of PRP.  Patient will continue  conservative therapy including bracing, home exercises and avoiding being barefoot.  Follow-up again in 6 to 8 weeks.  Lumbar radiculopathy Chronic, some tightness noted.  Likely exacerbated secondary to how patient was ambulating secondary to the foot pain.  Discussed icing regimen and home exercises, meloxicam which has been helpful.  Follow-up again in 6 to 8 weeks regarding the ankle x-rays ordered today as well to further evaluate heel spurs    Nonallopathic problems  Decision today to treat with OMT was based on Physical Exam  After verbal consent patient was treated with HVLA, ME, FPR techniques in cervical, rib, thoracic, lumbar, and sacral  areas  Patient tolerated the procedure well with improvement in symptoms  Patient given exercises, stretches and lifestyle modifications  See medications in patient instructions if given  Patient will follow up in 4-8 weeks    The above documentation has been reviewed and is accurate and complete Kendra Pulley, DO          Note: This dictation was prepared with Dragon dictation along with smaller phrase technology. Any transcriptional errors that result from this process are unintentional.

## 2022-02-17 ENCOUNTER — Ambulatory Visit (INDEPENDENT_AMBULATORY_CARE_PROVIDER_SITE_OTHER): Payer: 59

## 2022-02-17 ENCOUNTER — Ambulatory Visit (INDEPENDENT_AMBULATORY_CARE_PROVIDER_SITE_OTHER): Payer: 59 | Admitting: Family Medicine

## 2022-02-17 ENCOUNTER — Ambulatory Visit: Payer: Self-pay

## 2022-02-17 VITALS — BP 120/84 | HR 84 | Ht 67.0 in | Wt 160.0 lb

## 2022-02-17 DIAGNOSIS — M9908 Segmental and somatic dysfunction of rib cage: Secondary | ICD-10-CM

## 2022-02-17 DIAGNOSIS — M9902 Segmental and somatic dysfunction of thoracic region: Secondary | ICD-10-CM

## 2022-02-17 DIAGNOSIS — M9901 Segmental and somatic dysfunction of cervical region: Secondary | ICD-10-CM

## 2022-02-17 DIAGNOSIS — M5416 Radiculopathy, lumbar region: Secondary | ICD-10-CM

## 2022-02-17 DIAGNOSIS — M9903 Segmental and somatic dysfunction of lumbar region: Secondary | ICD-10-CM | POA: Diagnosis not present

## 2022-02-17 DIAGNOSIS — M9904 Segmental and somatic dysfunction of sacral region: Secondary | ICD-10-CM

## 2022-02-17 DIAGNOSIS — M722 Plantar fascial fibromatosis: Secondary | ICD-10-CM

## 2022-02-17 DIAGNOSIS — M79672 Pain in left foot: Secondary | ICD-10-CM

## 2022-02-17 DIAGNOSIS — M7732 Calcaneal spur, left foot: Secondary | ICD-10-CM | POA: Diagnosis not present

## 2022-02-17 DIAGNOSIS — M533 Sacrococcygeal disorders, not elsewhere classified: Secondary | ICD-10-CM

## 2022-02-17 NOTE — Patient Instructions (Addendum)
Xray today Injection in heel today Have a good birthday party Okay to start more cardio in 2 weeks

## 2022-02-17 NOTE — Assessment & Plan Note (Signed)
Chronic problem still noted.  Some exacerbation secondary to her avoiding certain pressure on her foot and that that is contributing to more of a discomfort and pain.  Discussed icing regimen and home exercises otherwise.  Follow-up with me again in 6 to 8 weeks.

## 2022-02-17 NOTE — Assessment & Plan Note (Signed)
Patient failed all other conservative therapy at this time.  Did not make any significant improvement with colchicine.  Still did not have any enlargement of the plantar fascia but did on the ultrasound appeared to have some partial tearing noted.  Injection given today and hopefully patient will make some improvement.  Did discuss the possibility of advanced imaging if patient fails this or the possibility of PRP.  Patient will continue conservative therapy including bracing, home exercises and avoiding being barefoot.  Follow-up again in 6 to 8 weeks.

## 2022-02-17 NOTE — Assessment & Plan Note (Signed)
Chronic, some tightness noted.  Likely exacerbated secondary to how patient was ambulating secondary to the foot pain.  Discussed icing regimen and home exercises, meloxicam which has been helpful.  Follow-up again in 6 to 8 weeks regarding the ankle x-rays ordered today as well to further evaluate heel spurs

## 2022-03-03 ENCOUNTER — Ambulatory Visit: Payer: 59 | Admitting: Family Medicine

## 2022-03-20 ENCOUNTER — Encounter: Payer: Self-pay | Admitting: Family Medicine

## 2022-03-23 ENCOUNTER — Other Ambulatory Visit: Payer: Self-pay

## 2022-03-23 MED FILL — Alendronate Sodium Tab 70 MG: ORAL | 84 days supply | Qty: 12 | Fill #2 | Status: AC

## 2022-03-24 ENCOUNTER — Other Ambulatory Visit: Payer: Self-pay

## 2022-03-28 NOTE — Progress Notes (Unsigned)
[place] Phone: [phone] Subjective:    I'm seeing this patient by the request  of:  Bacigalupo, Dionne Bucy, MD  CC: Low back and neck pain follow-up  [id number]  Kendra [name] is a 51 y.o. female coming in with complaint of back and neck pain. OMT on 02/17/2022. Also seen for heel pain.  At last follow-up we did inject the plantar fascia area.  Patient was going to follow-up with potential podiatry as well.  Patient states   Medications patient has been prescribed:   Taking:         Reviewed prior external information including notes and imaging from previsou exam, outside providers and external EMR if available.   As well as notes that were available from care everywhere and other healthcare systems.  Past medical history, social, surgical and family history all reviewed in electronic medical record.  No pertanent information unless stated regarding to the chief complaint.   Past Medical History:  Diagnosis Date   Allergy    Breast cancer ([place]) left   2004 with chemo tx   Osteoporosis    Personal history of chemotherapy     No Known Allergies   Review of Systems:  No headache, visual changes, nausea, vomiting, diarrhea, constipation, dizziness, abdominal pain, skin rash, fevers, chills, night sweats, weight loss, swollen lymph nodes, body aches, joint swelling, chest pain, shortness of breath, mood changes. POSITIVE muscle aches  Objective  Blood pressure 128/80, pulse 80, height '5\' 7"'$  (1.702 m), weight 156 lb (70.8 kg), SpO2 98 %.   General: No apparent distress alert and oriented x3 mood and affect normal, dressed appropriately.  HEENT: Pupils equal, extraocular movements intact  Respiratory: Patient's speak in full sentences and does not appear short of breath  Cardiovascular: No lower extremity edema, non tender, no erythema  Gait antalgic favoring the left heel. MSK:  Back back exam  does have tenderness to palpation in the right sacroiliac joint and gluteal area.  Does have tightness of the hamstring on the right side as well.  Heel exam shows worsening pain at this time.  Severely tender to palpation still over the the plantar aspect of the foot.  Patient does have good range of motion of the ankle.  The patient does have voluntary guarding noted in the area.  Limited muscular skeletal ultrasound was performed and interpreted by Hulan Saas, M  Limited ultrasound shows the patient does have decrease in diameter of the plantar fascia and decreased hypoechoic changes in the area.  Bone spur still noted though.  Osteopathic findings  C3 flexed rotated and side bent right C7 flexed rotated and side bent left T5 extended rotated and side bent right inhaled rib L2 flexed rotated and side bent right Sacrum right on right       Assessment and Plan:  Plantar fasciitis of left foot Patient was given injection and is unfortunately continuing to have pain.  Seems to be worsening even though the patient has a significant decrease in the diameter noted.  Discussed with patient about icing regimen and home exercises, which activities to do and which ones to avoid.  Increase activity slowly.  Follow-up again in 6 to 8 weeks.    Nonallopathic problems  Decision today to treat with OMT was based on Physical Exam  After verbal consent patient was treated with HVLA, ME, FPR techniques in cervical, rib, thoracic, lumbar, and sacral  areas  Patient  tolerated the procedure well with improvement in symptoms  Patient given exercises, stretches and lifestyle modifications  See medications in patient instructions if given  Patient will follow up in 4-8 weeks    The above documentation has been reviewed and is accurate and complete Lyndal Pulley, DO          Note: This dictation was prepared with Dragon dictation along with smaller phrase technology. Any  transcriptional errors that result from this process are unintentional.

## 2022-03-29 ENCOUNTER — Ambulatory Visit (INDEPENDENT_AMBULATORY_CARE_PROVIDER_SITE_OTHER): Payer: 59 | Admitting: Family Medicine

## 2022-03-29 ENCOUNTER — Ambulatory Visit: Payer: Self-pay

## 2022-03-29 VITALS — BP 128/80 | HR 80 | Ht 67.0 in | Wt 156.0 lb

## 2022-03-29 DIAGNOSIS — M79672 Pain in left foot: Secondary | ICD-10-CM | POA: Diagnosis not present

## 2022-03-29 DIAGNOSIS — M9903 Segmental and somatic dysfunction of lumbar region: Secondary | ICD-10-CM | POA: Diagnosis not present

## 2022-03-29 DIAGNOSIS — M9901 Segmental and somatic dysfunction of cervical region: Secondary | ICD-10-CM | POA: Diagnosis not present

## 2022-03-29 DIAGNOSIS — M9908 Segmental and somatic dysfunction of rib cage: Secondary | ICD-10-CM

## 2022-03-29 DIAGNOSIS — M9902 Segmental and somatic dysfunction of thoracic region: Secondary | ICD-10-CM | POA: Diagnosis not present

## 2022-03-29 DIAGNOSIS — M722 Plantar fascial fibromatosis: Secondary | ICD-10-CM | POA: Diagnosis not present

## 2022-03-29 DIAGNOSIS — M9904 Segmental and somatic dysfunction of sacral region: Secondary | ICD-10-CM

## 2022-03-29 NOTE — Patient Instructions (Addendum)
[place] [phone] Call Today  When we receive your results we will contact you.  See you again in 6-8 weeks

## 2022-03-29 NOTE — Assessment & Plan Note (Addendum)
Patient was given injection and is unfortunately continuing to have pain.  Seems to be worsening even though the patient has a significant decrease in the diameter noted. MRI ordered due to 1 year of pain failing, injection, shoes, and we will see what is potentially contributing.  If more of the plantar spur we will discuss surgical intervention versus possible shockwave therapy, more the plantar fascia can consider the possibility of PRP treatment, and we can consider other treatment options depending what else is found.

## 2022-03-31 ENCOUNTER — Ambulatory Visit: Payer: 59 | Admitting: Family Medicine

## 2022-04-07 ENCOUNTER — Ambulatory Visit (INDEPENDENT_AMBULATORY_CARE_PROVIDER_SITE_OTHER): Payer: 59 | Admitting: Podiatry

## 2022-04-07 ENCOUNTER — Encounter: Payer: Self-pay | Admitting: Podiatry

## 2022-04-07 ENCOUNTER — Other Ambulatory Visit: Payer: Self-pay

## 2022-04-07 DIAGNOSIS — M722 Plantar fascial fibromatosis: Secondary | ICD-10-CM

## 2022-04-07 DIAGNOSIS — B070 Plantar wart: Secondary | ICD-10-CM

## 2022-04-07 MED ORDER — FLUOROURACIL 5 % EX CREA
TOPICAL_CREAM | Freq: Two times a day (BID) | CUTANEOUS | 2 refills | Status: DC
  Filled 2022-04-07: qty 40, 20d supply, fill #0

## 2022-04-09 ENCOUNTER — Ambulatory Visit
Admission: RE | Admit: 2022-04-09 | Discharge: 2022-04-09 | Disposition: A | Discharge status: Home / Self Care | Payer: 59 | Source: Ambulatory Visit | Attending: Family Medicine | Admitting: Family Medicine

## 2022-04-09 DIAGNOSIS — M79672 Pain in left foot: Secondary | ICD-10-CM

## 2022-04-09 DIAGNOSIS — S86312A Strain of muscle(s) and tendon(s) of peroneal muscle group at lower leg level, left leg, initial encounter: Secondary | ICD-10-CM | POA: Diagnosis not present

## 2022-04-09 NOTE — Progress Notes (Signed)
Bilateral leg presents subjective:   Patient ID: Kendra [name], female   DOB: 51 y.o.   MRN: [id number]   HPI Patient presents stating she has had lesions on the bottom of her right foot which she has had for a number of months she is tried to use a medication on and saw a dermatologist.  Patient also has had chronic pain in the left heel that she has had 1 cortisone injection and has just recently had an MRI.  Patient does not smoke likes to be active   Review of Systems  All other systems reviewed and are negative.       Objective:  Physical Exam Vitals and nursing note reviewed.  Constitutional:      Appearance: She is well-developed.  Pulmonary:     Effort: Pulmonary effort is normal.  Musculoskeletal:        General: Normal range of motion.  Skin:    General: Skin is warm.  Neurological:     Mental Status: She is alert.     Neurovascular status was found to be intact muscle strength was found to be adequate.  Range of motion within normal limits with patient noted to have lesions on the plantar aspect right foot there are small white lesions upon debridement shows slight pinpoint bleeding and they are painful to lateral pressure.  On the left heel there is inflammation pain with plantar fascial band     Assessment:  Probability for verruca plantaris right along with plantar fasciitis left that she is still having treated currently     Plan:  H&P reviewed both conditions.  For the right I went ahead did sharp sterile debridement of the lesions and then applied chemical agent to create immune response with sterile dressing discussed her Planter fasciitis and at one point I can consider a more aggressive conservative pattern to fix this.  Educated her on the position.  Also prescribed Efudex for home usage

## 2022-04-17 ENCOUNTER — Encounter: Payer: Self-pay | Admitting: Family Medicine

## 2022-04-19 ENCOUNTER — Encounter: Payer: Self-pay | Admitting: Podiatry

## 2022-04-28 ENCOUNTER — Encounter: Payer: Self-pay | Admitting: Podiatry

## 2022-04-28 ENCOUNTER — Other Ambulatory Visit: Payer: Self-pay

## 2022-04-28 ENCOUNTER — Ambulatory Visit (INDEPENDENT_AMBULATORY_CARE_PROVIDER_SITE_OTHER): Payer: 59 | Admitting: Podiatry

## 2022-04-28 DIAGNOSIS — M722 Plantar fascial fibromatosis: Secondary | ICD-10-CM

## 2022-04-28 MED ORDER — TRIAMCINOLONE ACETONIDE 10 MG/ML IJ SUSP
10.0000 mg | Freq: Once | INTRAMUSCULAR | Status: AC
  Administered 2022-04-28: 10 mg

## 2022-04-28 MED ORDER — DICLOFENAC SODIUM 75 MG PO TBEC
75.0000 mg | DELAYED_RELEASE_TABLET | Freq: Two times a day (BID) | ORAL | 2 refills | Status: DC
  Filled 2022-04-28: qty 50, 25d supply, fill #0
  Filled 2022-05-18: qty 50, 25d supply, fill #1
  Filled 2022-06-15: qty 50, 25d supply, fill #2

## 2022-04-28 NOTE — Patient Instructions (Signed)

## 2022-05-02 NOTE — Progress Notes (Signed)
Subjective:   Patient ID: Kendra [name], female   DOB: 51 y.o.   MRN: [id number]   HPI Patient states the lesions on the right foot seem improved but there is still a lot of pain in the left heel with inflammation fluid around the heel itself when she tries to walk   ROS      Objective:  Physical Exam  Neurovascular status intact with small lesions on the plantar aspect right foot which appear to be improved and patient had history of blistering which does indicate good reaction.  On the left I noted exquisite discomfort left plantar fascia at the insertion of the tendon into the calcaneus with fluid buildup moderate depression of the arch     Assessment:  Improvement verruca plantaris right with inflammation fluid of the plantar heel left     Plan:  H&P reviewed condition and for the right I have recommended giving it time and I think the rest of the lesion will resolve with with most of them gone at this point.  For the left I went ahead I did sterile prep and I injected the fascia at insertion 3 mg Kenalog 5 mg Xylocaine I then fitted properly and applied fascial brace to lift up the arch gave instructions for physical therapy supportive therapy and reappoint to recheck

## 2022-05-08 DIAGNOSIS — H5213 Myopia, bilateral: Secondary | ICD-10-CM | POA: Diagnosis not present

## 2022-05-08 DIAGNOSIS — H04129 Dry eye syndrome of unspecified lacrimal gland: Secondary | ICD-10-CM | POA: Diagnosis not present

## 2022-05-10 ENCOUNTER — Ambulatory Visit: Payer: 59 | Admitting: Podiatry

## 2022-05-10 ENCOUNTER — Encounter: Payer: Self-pay | Admitting: Podiatry

## 2022-05-10 ENCOUNTER — Ambulatory Visit (INDEPENDENT_AMBULATORY_CARE_PROVIDER_SITE_OTHER): Payer: 59 | Admitting: Podiatry

## 2022-05-10 DIAGNOSIS — M722 Plantar fascial fibromatosis: Secondary | ICD-10-CM

## 2022-05-10 MED ORDER — TRIAMCINOLONE ACETONIDE 10 MG/ML IJ SUSP
10.0000 mg | Freq: Once | INTRAMUSCULAR | Status: AC
  Administered 2022-05-10: 10 mg

## 2022-05-10 NOTE — Progress Notes (Signed)
Subjective:   Patient ID: Kendra [name], female   DOB: 51 y.o.   MRN: [id number]   HPI Patient states she only had several days of relief with her left heel pain and's been hurting her for over a year now and also she did have an injection done by her family doctor which did not give her relief.  States the warts right have resolved and she is not able to do the activities she wants   ROS      Objective:  Physical Exam  Ocular status intact muscle strength adequate range of motion within normal limits.  Patient is found to have continued discomfort in the medial and central band of the plantar fascia left at the insertion of the tendon into the calcaneus with fluid buildup noted around this band.  Patient has good digital perfusion well oriented x 3 and the warts right have resolved     Assessment:  Inflammatory fasciitis left year duration that has not responded so far to conservative treatment     Plan:  H&P reviewed condition.  At this point organ to be more aggressive and this ultimately may require surgery if it does not respond but organ to try complete immobilization with air fracture walker dispensed today fitted well to her lower leg with instructions on usage continue oral anti-inflammatories and I did go ahead today I did sterile prep injected the band 3 mg Kenalog 5 mg Xylocaine

## 2022-05-11 DIAGNOSIS — L817 Pigmented purpuric dermatosis: Secondary | ICD-10-CM | POA: Diagnosis not present

## 2022-05-12 ENCOUNTER — Ambulatory Visit: Payer: 59 | Admitting: Family Medicine

## 2022-05-18 ENCOUNTER — Other Ambulatory Visit: Payer: Self-pay

## 2022-06-07 ENCOUNTER — Ambulatory Visit (INDEPENDENT_AMBULATORY_CARE_PROVIDER_SITE_OTHER): Payer: 59 | Admitting: Podiatry

## 2022-06-07 ENCOUNTER — Encounter: Payer: Self-pay | Admitting: Podiatry

## 2022-06-07 DIAGNOSIS — M722 Plantar fascial fibromatosis: Secondary | ICD-10-CM | POA: Diagnosis not present

## 2022-06-07 NOTE — Progress Notes (Signed)
Subjective:   Patient ID: Kendra [name], female   DOB: 51 y.o.   MRN: [id number]   HPI Patient states she is still having tightness and swelling in the arch and states that it feels like it needs to be stretched better.  States the boot is helping her and she is not having the sharp pain that she did before   ROS      Objective:  Physical Exam  Neuro vas status intact inflammation pain within the arch left the heel itself is improved somewhat over previous still sore when pressed directly but overall improved     Assessment:  Appears to be gradually improving with fasciitis symptoms left very tight arch left which may be compensatory or related to arch structure     Plan:  H&P discussed conditions at great length.  Still may end up needing surgery long-term but at this point we are going to try conservative and I did dispense night splint with all instructions on usage and the utilization of heat ice therapy.  Patient is discharged will be seen back all questions answered today and we will see him back 4 weeks to reevaluate and decide what might be necessary

## 2022-06-15 ENCOUNTER — Other Ambulatory Visit: Payer: Self-pay

## 2022-06-15 MED FILL — Alendronate Sodium Tab 70 MG: ORAL | 84 days supply | Qty: 12 | Fill #3 | Status: AC

## 2022-06-16 ENCOUNTER — Other Ambulatory Visit: Payer: Self-pay

## 2022-06-16 MED ORDER — TRIAMCINOLONE ACETONIDE 0.1 % EX CREA
TOPICAL_CREAM | CUTANEOUS | 1 refills | Status: AC
  Filled 2022-06-16: qty 30, 14d supply, fill #0
  Filled 2022-09-11: qty 30, 14d supply, fill #1

## 2022-06-20 NOTE — Progress Notes (Signed)
[place] Phone: [phone] Subjective:    I'm seeing this patient by the request  of:  Virginia Crews, MD  CC: Low back pain follow-up  [id number]  Kendra [name] is a 52 y.o. female coming in with complaint of back and neck pain. OMT 03/29/2022. Also f/u for L foot pain. Patient states hips are tight and thinks she popped a rib and now her left side/mid back area has been having spasm.  Medications patient has been prescribed: None        Reviewed prior external information including notes and imaging from previsou exam, outside providers and external EMR if available.   As well as notes that were available from care everywhere and other healthcare systems.  Past medical history, social, surgical and family history all reviewed in electronic medical record.  No pertanent information unless stated regarding to the chief complaint.   Past Medical History:  Diagnosis Date   Allergy    Breast cancer ([place]) left   2004 with chemo tx   Osteoporosis    Personal history of chemotherapy     No Known Allergies   Review of Systems:  No headache, visual changes, nausea, vomiting, diarrhea, constipation, dizziness, abdominal pain, skin rash, fevers, chills, night sweats, weight loss, swollen lymph nodes, body aches, joint swelling, chest pain, shortness of breath, mood changes. POSITIVE muscle aches  Objective  Blood pressure 124/70, pulse 94, height '5\' 7"'$  (1.702 m), SpO2 97 %.   General: No apparent distress alert and oriented x3 mood and affect normal, dressed appropriately.  HEENT: Pupils equal, extraocular movements intact  Respiratory: Patient's speak in full sentences and does not appear short of breath  Cardiovascular: No lower extremity edema, non tender, no erythema   Back exam does have some loss of lordosis.  Significant tightness around the hips noted again.  Seems to be more on the  sacroiliac joint.  Does have some limited range of motion noted of the hips.  He does have some tightness with FABER compared to previous exam.  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  This is likely secondary to patient compensating for the left foot for quite some time.  Discussed with patient about icing regimen and home exercises, which activities to do and which ones to avoid, increase activity slowly.  Discussed icing regimen.  Discussed meloxicam for breakthrough pain.  Increase activity slowly.  Follow-up again in 6 to 8 weeks    Nonallopathic problems  Decision today to treat with OMT was based on Physical Exam  After verbal consent patient was treated with HVLA, ME, FPR techniques in cervical, rib, thoracic, lumbar, and sacral  areas  Patient tolerated the procedure well with improvement in symptoms  Patient given exercises, stretches and lifestyle modifications  See medications in patient instructions if given  Patient will follow up in 4-8 weeks    The above documentation has been reviewed and is accurate and complete Lyndal Pulley, DO          Note: This dictation was prepared with Dragon dictation along with  smaller phrase technology. Any transcriptional errors that result from this process are unintentional.

## 2022-06-23 ENCOUNTER — Ambulatory Visit: Payer: 59 | Admitting: Family Medicine

## 2022-06-23 VITALS — BP 124/70 | HR 94 | Ht 67.0 in

## 2022-06-23 DIAGNOSIS — M9901 Segmental and somatic dysfunction of cervical region: Secondary | ICD-10-CM | POA: Diagnosis not present

## 2022-06-23 DIAGNOSIS — M9902 Segmental and somatic dysfunction of thoracic region: Secondary | ICD-10-CM | POA: Diagnosis not present

## 2022-06-23 DIAGNOSIS — M9904 Segmental and somatic dysfunction of sacral region: Secondary | ICD-10-CM

## 2022-06-23 DIAGNOSIS — M9903 Segmental and somatic dysfunction of lumbar region: Secondary | ICD-10-CM

## 2022-06-23 DIAGNOSIS — M5416 Radiculopathy, lumbar region: Secondary | ICD-10-CM | POA: Diagnosis not present

## 2022-06-23 DIAGNOSIS — M9908 Segmental and somatic dysfunction of rib cage: Secondary | ICD-10-CM | POA: Diagnosis not present

## 2022-06-23 DIAGNOSIS — M533 Sacrococcygeal disorders, not elsewhere classified: Secondary | ICD-10-CM

## 2022-06-23 NOTE — Assessment & Plan Note (Signed)
Chronic problem with exacerbation.  This is likely secondary to patient compensating for the left foot for quite some time.  Discussed with patient about icing regimen and home exercises, which activities to do and which ones to avoid, increase activity slowly.  Discussed icing regimen.  Discussed meloxicam for breakthrough pain.  Increase activity slowly.  Follow-up again in 6 to 8 weeks

## 2022-06-23 NOTE — Assessment & Plan Note (Signed)
More tightness around the back as well as the sacroiliac joint.  We will monitor.  Discussed if any other prescription refills are needed at this time.  Not take anything on a regular basis.  Continue work on [employer], [employer].  Follow-up again in 6 to 8 weeks.

## 2022-06-23 NOTE — Patient Instructions (Signed)
Good to see you See me again in 6-8 weeks 

## 2022-07-05 ENCOUNTER — Ambulatory Visit: Payer: 59 | Admitting: Podiatry

## 2022-07-05 ENCOUNTER — Encounter: Payer: Self-pay | Admitting: Podiatry

## 2022-07-05 DIAGNOSIS — M722 Plantar fascial fibromatosis: Secondary | ICD-10-CM

## 2022-07-05 MED ORDER — TRIAMCINOLONE ACETONIDE 10 MG/ML IJ SUSP
10.0000 mg | Freq: Once | INTRAMUSCULAR | Status: AC
  Administered 2022-07-05: 10 mg

## 2022-07-05 NOTE — Progress Notes (Signed)
Subjective:   Patient ID: Kendra [name], female   DOB: 52 y.o.   MRN: [id number]   HPI Patient presents stating she has had some increase in intensity of pain that was better with the boot but not really bothering her again it seems like it could be in a different area   ROS      Objective:  Physical Exam  Neurovascular status intact with intense discomfort in the left central and lateral band of the fascia with inflammation in this area now     Assessment:  Moderate improvement of fasciitis medial band even though she seems to indicate it is worse at different times with pain that is quite intense in the central lateral band     Plan:  Reviewed condition and I have got a degree of optimism since it is on the lateral side and I do think that we can try to treat this.  I went ahead today did sterile prep and injected the lateral band of the fascia and into the central band 3 mg Kenalog 5 mg Xylocaine and advised on using her boot for the next 10 days and reappoint 3 weeks and we may have to consider more aggressive approach which could be PRP could be shockwave or open cutting surgery depending on how it is doing and I have asked her to flared up prior to coming to see me

## 2022-07-12 ENCOUNTER — Encounter: Payer: Self-pay | Admitting: Podiatry

## 2022-07-27 ENCOUNTER — Other Ambulatory Visit: Payer: Self-pay

## 2022-07-27 MED ORDER — AMOXICILLIN 500 MG PO CAPS
500.0000 mg | ORAL_CAPSULE | Freq: Four times a day (QID) | ORAL | 0 refills | Status: DC
  Filled 2022-07-27: qty 28, 7d supply, fill #0

## 2022-07-28 ENCOUNTER — Ambulatory Visit: Payer: 59 | Admitting: Podiatry

## 2022-07-28 DIAGNOSIS — M722 Plantar fascial fibromatosis: Secondary | ICD-10-CM | POA: Diagnosis not present

## 2022-07-28 NOTE — Progress Notes (Signed)
Subjective:   Patient ID: Kendra [name], female   DOB: 52 y.o.   MRN: [id number]   HPI Patient states it has been quite a bit more flared up this had been active this week and I wanted it checked   ROS      Objective:  Physical Exam  Ocular status intact pain which is mostly in the center of the plantar fascia left and extending into the lateral and mildly into the medial portion of the fascial band     Assessment:  Appears to be acute plantar fasciitis left that does not respond to numerous conservative treatments     Plan:  H&P reviewed and at this point I do think shockwave therapy is her best solution and I reviewed and educated her on shockwave therapy.  She wants to have this done scheduled for shockwave therapy all questions answered today

## 2022-08-01 ENCOUNTER — Other Ambulatory Visit: Payer: Self-pay

## 2022-08-03 NOTE — Progress Notes (Unsigned)
  [place] Phone: [phone] Subjective:   [name], am serving as a scribe for Dr. Hulan Saas.  I'm seeing this patient by the request  of:  Bacigalupo, Dionne Bucy, MD  CC: back and neck pain   [id number]  Kendra [name] is a 52 y.o. female coming in with complaint of back and neck pain. OMT on 06/23/2022. Patient states that she continues to have plantar fasciitis. No longer wearing the boot so her back pain is less than in January.   Medications patient has been prescribed:   Taking:         Reviewed prior external information including notes and imaging from previsou exam, outside providers and external EMR if available.   As well as notes that were available from care everywhere and other healthcare systems.  Past medical history, social, surgical and family history all reviewed in electronic medical record.  No pertanent information unless stated regarding to the chief complaint.   Past Medical History:  Diagnosis Date   Allergy    Breast cancer ([place]) left   2004 with chemo tx   Osteoporosis    Personal history of chemotherapy     No Known Allergies   Review of Systems:  No headache, visual changes, nausea, vomiting, diarrhea, constipation, dizziness, abdominal pain, skin rash, fevers, chills, night sweats, weight loss, swollen lymph nodes, body aches, joint swelling, chest pain, shortness of breath, mood changes. POSITIVE muscle aches  Objective  There were no vitals taken for this visit.   General: No apparent distress alert and oriented x3 mood and affect normal, dressed appropriately.  HEENT: Pupils equal, extraocular movements intact  Respiratory: Patient's speak in full sentences and does not appear short of breath  Cardiovascular: No lower extremity edema, non tender, no erythema  Gait MSK:  Back   Osteopathic findings  C2 flexed rotated and side bent right C6 flexed  rotated and side bent left T3 extended rotated and side bent right inhaled rib T9 extended rotated and side bent left L2 flexed rotated and side bent right Sacrum right on right       Assessment and Plan:  No problem-specific Assessment & Plan notes found for this encounter.    Nonallopathic problems  Decision today to treat with OMT was based on Physical Exam  After verbal consent patient was treated with HVLA, ME, FPR techniques in cervical, rib, thoracic, lumbar, and sacral  areas  Patient tolerated the procedure well with improvement in symptoms  Patient given exercises, stretches and lifestyle modifications  See medications in patient instructions if given  Patient will follow up in 4-8 weeks    The above documentation has been reviewed and is accurate and complete Lyndal Pulley, DO          Note: This dictation was prepared with Dragon dictation along with smaller phrase technology. Any transcriptional errors that result from this process are unintentional.

## 2022-08-04 ENCOUNTER — Encounter: Payer: Self-pay | Admitting: Podiatry

## 2022-08-04 ENCOUNTER — Ambulatory Visit: Payer: 59 | Admitting: Family Medicine

## 2022-08-04 VITALS — BP 122/78 | HR 73 | Ht 67.0 in | Wt 157.0 lb

## 2022-08-04 DIAGNOSIS — M722 Plantar fascial fibromatosis: Secondary | ICD-10-CM

## 2022-08-04 DIAGNOSIS — M9904 Segmental and somatic dysfunction of sacral region: Secondary | ICD-10-CM

## 2022-08-04 DIAGNOSIS — M9903 Segmental and somatic dysfunction of lumbar region: Secondary | ICD-10-CM | POA: Diagnosis not present

## 2022-08-04 DIAGNOSIS — M9901 Segmental and somatic dysfunction of cervical region: Secondary | ICD-10-CM | POA: Diagnosis not present

## 2022-08-04 DIAGNOSIS — M9902 Segmental and somatic dysfunction of thoracic region: Secondary | ICD-10-CM

## 2022-08-04 DIAGNOSIS — M533 Sacrococcygeal disorders, not elsewhere classified: Secondary | ICD-10-CM

## 2022-08-04 DIAGNOSIS — M9908 Segmental and somatic dysfunction of rib cage: Secondary | ICD-10-CM | POA: Diagnosis not present

## 2022-08-04 NOTE — Patient Instructions (Signed)
Ask about shockwave therapy up front See you again in 6 weeks

## 2022-08-06 NOTE — Assessment & Plan Note (Signed)
Continuing to have difficulty with the plantar fascia and does have a very small calcaneal heel spur.  Do feel that possible candidate for shockwave therapy.  Patient will look into it and decide if this is something she would like to do.

## 2022-08-06 NOTE — Assessment & Plan Note (Signed)
Continues to have the chronic problem.  Is still having difficulty with the foot that likely.  Discussed icing regimen and home exercises.  Discussed which activities to do and which ones to avoid.  Discussed with patient to continue to work on core strengthening where possible.  Patient continues to workout on a fairly regular basis.  Responds well to osteopathic manipulation.  Follow-up again in 6 to 8 weeks

## 2022-08-07 NOTE — Telephone Encounter (Signed)
Ok great, thanks!

## 2022-08-11 ENCOUNTER — Other Ambulatory Visit: Payer: 59

## 2022-08-11 ENCOUNTER — Ambulatory Visit: Payer: Self-pay | Admitting: Sports Medicine

## 2022-08-11 DIAGNOSIS — M722 Plantar fascial fibromatosis: Secondary | ICD-10-CM

## 2022-08-11 NOTE — Progress Notes (Signed)
   [facility] [place] Phone: [phone]   Extracorporeal Shockwave Therapy Note    Patient is being treated today with ECSWT. Informed consent was obtained and patient tolerated procedure well.   Therapy performed by Glennon Mac  Condition treated: plantar fascitis  Treatment preset used: Chronic plantar fascitis Energy used: 90 mJ Frequency used: 10 Hz Number of pulses: 3000 Treatment #1 of #3  Electronically signed by:  [facility] 12:56 PM 08/11/22

## 2022-08-17 ENCOUNTER — Ambulatory Visit: Payer: Self-pay | Admitting: Sports Medicine

## 2022-08-17 ENCOUNTER — Other Ambulatory Visit: Payer: 59

## 2022-08-17 DIAGNOSIS — M722 Plantar fascial fibromatosis: Secondary | ICD-10-CM

## 2022-08-17 NOTE — Progress Notes (Signed)
   [facility] [place] Phone: [phone]   Extracorporeal Shockwave Therapy Note    Patient is being treated today with ECSWT. Informed consent was obtained and patient tolerated procedure well.   Therapy performed by Glennon Mac  Condition treated: plantar fascitis Treatment preset used: chronic plantar fascitis Energy used: 90 mJ Frequency used: 10 Hz Number of pulses: 3000 Treatment #2 of #3  Electronically signed by:  Jonette [facility] 4:04 PM 08/17/22

## 2022-08-24 ENCOUNTER — Ambulatory Visit (INDEPENDENT_AMBULATORY_CARE_PROVIDER_SITE_OTHER): Payer: Self-pay | Admitting: Family Medicine

## 2022-08-24 DIAGNOSIS — M722 Plantar fascial fibromatosis: Secondary | ICD-10-CM

## 2022-08-24 NOTE — Progress Notes (Signed)
   [name] [place] Phone: [phone]   Extracorporeal Shockwave Therapy Note    Patient is being treated today with ECSWT. Informed consent was obtained and patient tolerated procedure well.   Therapy performed by Lynne Leader  Condition treated: Planter fasciitis left Treatment preset used: Planter fasciitis Energy used: 120 mJ (previously 90) Frequency used: 10 Hz Number of pulses: 3000 Treatment #3 of #3-6  Electronically signed by:  [name] 7:56 AM 08/24/22  She felt that the initial 2 treatments at 90 mJ were mildly helpful but not powerful enough.  I increased the power settings to 120 mJ which she felt was a little more effective.  She would like to continue the treatments as she feels like they are starting to be helpful and we will schedule more with Dr. Glennon Mac or myself.

## 2022-08-24 NOTE — Patient Instructions (Signed)
Thank you for coming in today.   See how it goes.   If you get some benefit we may want to extend the treatments with Dr Glennon Mac or Dr Georgina Snell

## 2022-08-25 ENCOUNTER — Other Ambulatory Visit: Payer: 59

## 2022-09-01 ENCOUNTER — Ambulatory Visit: Payer: Self-pay | Admitting: Sports Medicine

## 2022-09-01 DIAGNOSIS — M79672 Pain in left foot: Secondary | ICD-10-CM

## 2022-09-01 DIAGNOSIS — M722 Plantar fascial fibromatosis: Secondary | ICD-10-CM

## 2022-09-01 NOTE — Progress Notes (Signed)
   [facility] [place] Phone: [phone]   Extracorporeal Shockwave Therapy Note    Patient is being treated today with ECSWT. Informed consent was obtained and patient tolerated procedure well.  Patient did state that some pain is becoming more concentrated around her heel as well as posteriorly along distal Achilles tendon.  She had started some eccentric Achilles loading which was helpful.  Patient overall felt that increasing energy to 120 MJ was more beneficial than 90.  HEP for Achilles tendinitis provided.  Therapy performed by Glennon Mac  Condition treated: Chronic plan fasciitis, and Achilles tendinitis Treatment preset used: Chronic plan fasciitis Energy used: 120 mJ Frequency used: 10 hz Number of pulses: 3000 Treatment #5 of #6  Electronically signed by:  Jonette [facility] 2:19 PM 09/01/22

## 2022-09-06 ENCOUNTER — Ambulatory Visit: Payer: Self-pay | Admitting: Sports Medicine

## 2022-09-06 DIAGNOSIS — M722 Plantar fascial fibromatosis: Secondary | ICD-10-CM

## 2022-09-06 DIAGNOSIS — M79672 Pain in left foot: Secondary | ICD-10-CM

## 2022-09-06 NOTE — Progress Notes (Signed)
   [facility] [place] Phone: [phone]   Extracorporeal Shockwave Therapy Note    Patient is being treated today with ECSWT. Informed consent was obtained and patient tolerated procedure well.   Pain does appear to be more concentrated toward the posterior calcaneus and Achilles tendon at this time.  She started Achilles tendinitis HEP were provided at previous office visit and is overall having some benefit.  Patient overall felt that increasing energy to 120 MJ was more beneficial than 90.   Therapy performed by Glennon Mac  Condition treated: Chronic plantar fasciitis and Achilles tendinitis Treatment preset used: Chronic plantar fasciitis Energy used: 120 mJ Frequency used: 10 hz Number of pulses: 3000 Treatment #6 of #8  Electronically signed by:  Jonette Mate [facility] 8:04 AM 09/06/22

## 2022-09-11 ENCOUNTER — Other Ambulatory Visit: Payer: Self-pay | Admitting: Family Medicine

## 2022-09-11 ENCOUNTER — Other Ambulatory Visit: Payer: Self-pay

## 2022-09-11 DIAGNOSIS — M818 Other osteoporosis without current pathological fracture: Secondary | ICD-10-CM

## 2022-09-11 MED ORDER — ALENDRONATE SODIUM 70 MG PO TABS
70.0000 mg | ORAL_TABLET | ORAL | 3 refills | Status: DC
  Filled 2022-09-11: qty 12, 84d supply, fill #0
  Filled 2022-12-11: qty 12, 84d supply, fill #1
  Filled 2023-06-12: qty 12, 84d supply, fill #3
  Filled ????-??-??: fill #2

## 2022-09-11 NOTE — Progress Notes (Unsigned)
I,Sherlonda Flater S Norvell Ureste,acting as a Education administrator for Lavon Paganini, MD.,have documented all relevant documentation on the behalf of Lavon Paganini, MD,as directed by  Lavon Paganini, MD while in the presence of Lavon Paganini, MD.    Complete physical exam   [name]   DOB: August 08, 1970   52 y.o. Female  MRN: [id number] Visit Date: 09/12/2022  Today's healthcare provider: Lavon Paganini, MD   Chief Complaint  Patient presents with   Annual Exam   Subjective    Kendra [name] is a 52 y.o. female who presents today for a complete physical exam.  She reports consuming a general diet. Gym/ health club routine includes cardio and mod to heavy weightlifting. She generally feels well. She reports sleeping well. She does not have additional problems to discuss today.   HPI   Tdap vaccine-declined today Shingles vaccine-declined today  Seeing Sports med for plantar fasciitis Seeing Derm for vasculitis?  Past Medical History:  Diagnosis Date   Allergy    Breast cancer left   2004 with chemo tx   Osteoporosis    Personal history of chemotherapy    Past Surgical History:  Procedure Laterality Date   BREAST SURGERY     implant left in 2004, and biopsy   COLONOSCOPY WITH PROPOFOL N/A 12/31/2020   Procedure: COLONOSCOPY WITH PROPOFOL;  Surgeon: Virgel Manifold, MD;  Location: [facility] ENDOSCOPY;  Service: Gastroenterology;  Laterality: N/A;   MASTECTOMY Left 01/07/03   with tram flap and right breast lift in 2008   TOTAL ABDOMINAL HYSTERECTOMY     related to breast cancer   TOTAL ABDOMINAL HYSTERECTOMY W/ BILATERAL SALPINGOOPHORECTOMY  10/2003   Social History   Socioeconomic History   Marital status: Married    Spouse name: Zehava Hardnett   Number of children: 2   Years of education: Not on file   Highest education level: Not on file  Occupational History   Not on file  Tobacco Use   Smoking status: Never   Smokeless tobacco: Never  Vaping Use   Vaping  Use: Never used  Substance and Sexual Activity   Alcohol use: Yes    Alcohol/week: 2.0 standard drinks of alcohol    Types: 2 Glasses of wine per week   Drug use: No   Sexual activity: Yes    Birth control/protection: Other-see comments    Comment: had total hysterectomy  Other Topics Concern   Not on file  Social History Narrative   Not on file   Social Determinants of Health   Financial Resource Strain: Not on file  Food Insecurity: Not on file  Transportation Needs: Not on file  Physical Activity: Not on file  Stress: Not on file  Social Connections: Not on file  Intimate Partner Violence: Not on file   Family Status  Relation Name Status   Mother  Alive   Father  Deceased at age 18       lung cancer   Brother  Alive   Family History  Problem Relation Age of Onset   Arthritis Mother    Hypothyroidism Mother    Breast cancer Mother 23   Alcohol abuse Brother    No Known Allergies  Patient Care Team: Virginia Crews, MD as PCP - General (Family Medicine)   Medications: Outpatient Medications Prior to Visit  Medication Sig   alendronate (FOSAMAX) 70 MG tablet Take 1 tablet (70 mg total) by mouth every 7 (seven) days. Take with a full glass of  water on an empty stomach.   loratadine-pseudoephedrine (CLARITIN-D 24 HOUR) 10-240 MG 24 hr tablet Take 1 tablet by mouth daily.   triamcinolone cream (KENALOG) 0.1 % Apply twice daily to rash up to 2 weeks/month as needed.   valACYclovir (VALTREX) 1000 MG tablet Take 2 tablets (2,000 mg total) by mouth 2 (two) times daily. At onset of cold sore   [DISCONTINUED] amoxicillin (AMOXIL) 500 MG capsule Take 1 capsule (500 mg total) by mouth 4 (four) times daily until gone. (Patient not taking: Reported on 09/12/2022)   [DISCONTINUED] Calcium-Vitamin D 600-200 MG-UNIT per tablet Take 1 tablet by mouth daily.  (Patient not taking: Reported on 09/12/2022)   [DISCONTINUED] cyclobenzaprine (FLEXERIL) 10 MG tablet Take 1 tablet (10 mg  total) by mouth 3 (three) times daily as needed for muscle spasms. (Patient not taking: Reported on 09/12/2022)   [DISCONTINUED] diclofenac (VOLTAREN) 75 MG EC tablet Take 1 tablet (75 mg total) by mouth 2 (two) times daily. (Patient not taking: Reported on 09/12/2022)   [DISCONTINUED] fluticasone (FLONASE) 50 MCG/ACT nasal spray PLACE 1 SPRAY INTO BOTH NOSTRILS DAILY. (Patient not taking: Reported on 09/12/2022)   [DISCONTINUED] meloxicam (MOBIC) 15 MG tablet Take 1 tablet (15 mg total) by mouth daily. (Patient not taking: Reported on 09/12/2022)   [DISCONTINUED] triamcinolone cream (KENALOG) 0.1 % Apply twice daily to rash up to 2 weeks/month as needed. (Patient not taking: Reported on 09/12/2022)   No facility-administered medications prior to visit.    Review of Systems  All other systems reviewed and are negative.   Last CBC Lab Results  Component Value Date   WBC 4.6 09/09/2021   HGB 13.1 09/09/2021   HCT 39.0 09/09/2021   MCV 92 09/09/2021   MCH 31.0 09/09/2021   RDW 11.9 09/09/2021   PLT 170 [id number]   Last metabolic panel Lab Results  Component Value Date   GLUCOSE 91 09/09/2021   NA 138 09/09/2021   K 4.8 09/09/2021   CL 101 09/09/2021   CO2 28 09/09/2021   BUN 15 09/09/2021   CREATININE 0.86 09/09/2021   EGFR 82 09/09/2021   CALCIUM 9.6 09/09/2021   PROT 6.5 09/09/2021   ALBUMIN 4.4 09/09/2021   LABGLOB 2.1 09/09/2021   AGRATIO 2.1 09/09/2021   BILITOT 0.7 09/09/2021   ALKPHOS 42 (L) 09/09/2021   AST 16 09/09/2021   ALT 13 09/09/2021   ANIONGAP 8 01/10/2012   Last lipids Lab Results  Component Value Date   CHOL 191 09/09/2021   HDL 54 09/09/2021   LDLCALC 123 (H) 09/09/2021   TRIG 74 09/09/2021   CHOLHDL 3.5 09/09/2021   Last hemoglobin A1c Lab Results  Component Value Date   HGBA1C 5.4 09/03/2020   Last thyroid functions Lab Results  Component Value Date   TSH 2.270 09/09/2021   Last vitamin D Lab Results  Component Value Date   VD25OH 47.4  09/09/2021      Objective    BP 120/73 (BP Location: Right Arm, Patient Position: Sitting, Cuff Size: Large)   Pulse 71   Temp 98 F (36.7 C) (Temporal)   Resp 16   Ht 5' 7.5" (1.715 m)   Wt 153 lb 11.2 oz (69.7 kg)   BMI 23.72 kg/m  BP Readings from Last 3 Encounters:  09/12/22 120/73  08/04/22 122/78  06/23/22 124/70   Wt Readings from Last 3 Encounters:  09/12/22 153 lb 11.2 oz (69.7 kg)  08/04/22 157 lb (71.2 kg)  03/29/22 156 lb (70.8 kg)  Physical Exam Vitals reviewed.  Constitutional:      General: She is not in acute distress.    Appearance: Normal appearance. She is well-developed. She is not diaphoretic.  HENT:     Head: Normocephalic and atraumatic.     Right Ear: External ear normal.     Left Ear: External ear normal.     Nose: Nose normal.     Mouth/Throat:     Mouth: Mucous membranes are moist.     Pharynx: Oropharynx is clear. No oropharyngeal exudate.  Eyes:     General: No scleral icterus.    Conjunctiva/sclera: Conjunctivae normal.     Pupils: Pupils are equal, round, and reactive to light.  Neck:     Thyroid: No thyromegaly.  Cardiovascular:     Rate and Rhythm: Normal rate and regular rhythm.     Pulses: Normal pulses.     Heart sounds: Normal heart sounds. No murmur heard. Pulmonary:     Effort: Pulmonary effort is normal. No respiratory distress.     Breath sounds: Normal breath sounds. No wheezing or rales.  Abdominal:     General: There is no distension.     Palpations: Abdomen is soft.     Tenderness: There is no abdominal tenderness.  Musculoskeletal:        General: No deformity.     Cervical back: Neck supple.     Right lower leg: No edema.     Left lower leg: No edema.  Lymphadenopathy:     Cervical: No cervical adenopathy.  Skin:    General: Skin is warm and dry.     Findings: Rash present.  Neurological:     Mental Status: She is alert and oriented to person, place, and time. Mental status is at baseline.      Gait: Gait normal.  Psychiatric:        Mood and Affect: Mood normal.        Behavior: Behavior normal.        Thought Content: Thought content normal.      Last depression screening scores    09/12/2022    8:30 AM 09/09/2021    8:46 AM 09/03/2020    8:20 AM  PHQ 2/9 Scores  PHQ - 2 Score 0 0 0  PHQ- 9 Score 0 0 0   Last fall risk screening    09/12/2022    8:30 AM  [place] in the past year? 0  Number falls in past yr: 0  Injury with Fall? 0  Risk for fall due to : No Fall Risks  Follow up Falls evaluation completed   Last Audit-C alcohol use screening    09/12/2022    8:31 AM  Alcohol Use Disorder Test (AUDIT)  1. How often do you have a drink containing alcohol? 0  2. How many drinks containing alcohol do you have on a typical day when you are drinking? 0  3. How often do you have six or more drinks on one occasion? 0  AUDIT-C Score 0   A score of 3 or more in women, and 4 or more in men indicates increased risk for alcohol abuse, EXCEPT if all of the points are from question 1   No results found for any visits on 09/12/22.  Assessment & Plan    Routine Health Maintenance and Physical Exam  Exercise Activities and Dietary recommendations  Goals      Exercise 150 minutes per week (moderate activity)  Immunization History  Administered Date(s) Administered   Influenza,inj,Quad PF,6+ Mos 03/13/2015   Influenza-Unspecified 02/19/2017, 04/07/2018, 04/06/2021   PFIZER(Purple Top)SARS-COV-2 Vaccination 07/03/2019, 07/22/2019    Health Maintenance  Topic Date Due   DTaP/Tdap/Td (1 - Tdap) Never done   Zoster Vaccines- Shingrix (1 of 2) Never done   COVID-19 Vaccine (3 - Pfizer risk series) 08/19/2019   COLONOSCOPY (Pts 45-42yrs Insurance coverage will need to be confirmed)  12/31/2021   INFLUENZA VACCINE  01/11/2023   MAMMOGRAM  12/03/2023   Hepatitis C Screening  Completed   HIV Screening  Completed   HPV VACCINES  Aged Out    Discussed  health benefits of physical activity, and encouraged her to engage in regular exercise appropriate for her age and condition.  Problem List Items Addressed This Visit       Digestive   Polyp of hepatic flexure of colon    Due for repeat colonoscopy for polyp Referral to GI      Relevant Orders   Ambulatory referral to Gastroenterology     Musculoskeletal and Integument   OP (osteoporosis)    Continue Ca/Vit D Repeat DEXA Continue Fosamax - ws started in 2022      Relevant Orders   DG Bone Density   VITAMIN D 25 Hydroxy (Vit-D Deficiency, Fractures)     Other   History of breast cancer   Relevant Orders   Cancer antigen 27.29   Other Visit Diagnoses     Encounter for annual physical exam    -  Primary   Relevant Orders   Lipid Panel With LDL/HDL Ratio   Comprehensive metabolic panel   TSH   CBC w/Diff/Platelet   VITAMIN D 25 Hydroxy (Vit-D Deficiency, Fractures)   Cancer antigen 27.29   Breast cancer screening by mammogram       Relevant Orders   MM 3D SCREENING MAMMOGRAM UNILATERAL RIGHT BREAST        Return in about 1 year (around 09/12/2023) for CPE.     I, Lavon Paganini, MD, have reviewed all documentation for this visit. The documentation on 09/12/22 for the exam, diagnosis, procedures, and orders are all accurate and complete.   Bacigalupo, Dionne Bucy, MD, MPH [facility]

## 2022-09-12 ENCOUNTER — Other Ambulatory Visit: Payer: Self-pay

## 2022-09-12 ENCOUNTER — Encounter: Payer: Self-pay | Admitting: Family Medicine

## 2022-09-12 ENCOUNTER — Ambulatory Visit (INDEPENDENT_AMBULATORY_CARE_PROVIDER_SITE_OTHER): Payer: 59 | Admitting: Family Medicine

## 2022-09-12 VITALS — BP 120/73 | HR 71 | Temp 98.0°F | Resp 16 | Ht 67.5 in | Wt 153.7 lb

## 2022-09-12 DIAGNOSIS — Z853 Personal history of malignant neoplasm of breast: Secondary | ICD-10-CM

## 2022-09-12 DIAGNOSIS — K635 Polyp of colon: Secondary | ICD-10-CM | POA: Diagnosis not present

## 2022-09-12 DIAGNOSIS — Z0000 Encounter for general adult medical examination without abnormal findings: Secondary | ICD-10-CM | POA: Diagnosis not present

## 2022-09-12 DIAGNOSIS — Z1231 Encounter for screening mammogram for malignant neoplasm of breast: Secondary | ICD-10-CM | POA: Diagnosis not present

## 2022-09-12 DIAGNOSIS — M818 Other osteoporosis without current pathological fracture: Secondary | ICD-10-CM | POA: Diagnosis not present

## 2022-09-12 NOTE — Assessment & Plan Note (Signed)
Due for repeat colonoscopy for polyp Referral to GI

## 2022-09-12 NOTE — Assessment & Plan Note (Signed)
Continue Ca/Vit D Repeat DEXA Continue Fosamax - ws started in 2022

## 2022-09-15 ENCOUNTER — Ambulatory Visit: Payer: Self-pay | Admitting: Family Medicine

## 2022-09-15 DIAGNOSIS — M722 Plantar fascial fibromatosis: Secondary | ICD-10-CM

## 2022-09-15 DIAGNOSIS — M79672 Pain in left foot: Secondary | ICD-10-CM

## 2022-09-15 NOTE — Progress Notes (Deleted)
  [name] [address] [place] [address] Phone: [phone] Subjective:    I'm seeing this patient by the request  of:  Bacigalupo, Marzella Schlein, MD  CC:   [id number]  Kendra [name] is a 52 y.o. female coming in with complaint of back and neck pain. OMT 08/04/2022. Also f/u for L heel pain. Patient has been doing shockwave therapy. Patient states   Medications patient has been prescribed: None  Taking:         Reviewed prior external information including notes and imaging from previsou exam, outside providers and external EMR if available.   As well as notes that were available from care everywhere and other healthcare systems.  Past medical history, social, surgical and family history all reviewed in electronic medical record.  No pertanent information unless stated regarding to the chief complaint.   Past Medical History:  Diagnosis Date   Allergy    Breast cancer left   2004 with chemo tx   Osteoporosis    Personal history of chemotherapy     No Known Allergies   Review of Systems:  No headache, visual changes, nausea, vomiting, diarrhea, constipation, dizziness, abdominal pain, skin rash, fevers, chills, night sweats, weight loss, swollen lymph nodes, body aches, joint swelling, chest pain, shortness of breath, mood changes. POSITIVE muscle aches  Objective  There were no vitals taken for this visit.   General: No apparent distress alert and oriented x3 mood and affect normal, dressed appropriately.  HEENT: Pupils equal, extraocular movements intact  Respiratory: Patient's speak in full sentences and does not appear short of breath  Cardiovascular: No lower extremity edema, non tender, no erythema  Gait MSK:  Back   Osteopathic findings  C2 flexed rotated and side bent right C6 flexed rotated and side bent left T3 extended rotated and side bent right inhaled rib T9 extended rotated and side bent left L2 flexed  rotated and side bent right Sacrum right on right       Assessment and Plan:  No problem-specific Assessment & Plan notes found for this encounter.    Nonallopathic problems  Decision today to treat with OMT was based on Physical Exam  After verbal consent patient was treated with HVLA, ME, FPR techniques in cervical, rib, thoracic, lumbar, and sacral  areas  Patient tolerated the procedure well with improvement in symptoms  Patient given exercises, stretches and lifestyle modifications  See medications in patient instructions if given  Patient will follow up in 4-8 weeks             Note: This dictation was prepared with Dragon dictation along with smaller phrase technology. Any transcriptional errors that result from this process are unintentional.

## 2022-09-15 NOTE — Progress Notes (Signed)
   [facility] [address] [place] [address] Phone: [phone]   Extracorporeal Shockwave Therapy Note    Patient is being treated today with ECSWT. Informed consent was obtained and patient tolerated procedure well.   Therapy performed by Clementeen Graham  Condition treated: Left foot plantar fasciitis and Achilles tendinitis Treatment preset used: Achilles tendinitis Energy used: 120 mJ Frequency used: 10 Hz Number of pulses: 3000 Planter calcaneus and posterior calcaneus Treatment #7 of #8  Electronically signed by:  [facility] 7:54 AM 09/15/22

## 2022-09-18 ENCOUNTER — Other Ambulatory Visit: Payer: Self-pay | Admitting: *Deleted

## 2022-09-18 ENCOUNTER — Telehealth: Payer: Self-pay | Admitting: *Deleted

## 2022-09-18 DIAGNOSIS — Z86010 Personal history of colonic polyps: Secondary | ICD-10-CM

## 2022-09-18 NOTE — Telephone Encounter (Signed)
Gastroenterology Pre-Procedure Review  Request Date: 02/02/2023  Requesting Physician: Dr. Tobi Bastos  PATIENT REVIEW QUESTIONS: The patient responded to the following health history questions as indicated:    1. Are you having any GI issues? no 2. Do you have a personal history of Polyps? yes (12/31/2021) 3. Do you have a family history of Colon Cancer or Polyps? no 4. Diabetes Mellitus? no 5. Joint replacements in the past 12 months?no 6. Major health problems in the past 3 months?no 7. Any artificial heart valves, MVP, or defibrillator?no    MEDICATIONS & ALLERGIES:    Patient reports the following regarding taking any anticoagulation/antiplatelet therapy:   Plavix, Coumadin, Eliquis, Xarelto, Lovenox, Pradaxa, Brilinta, or Effient? no Aspirin? no  Patient confirms/reports the following medications:  Current Outpatient Medications  Medication Sig Dispense Refill   alendronate (FOSAMAX) 70 MG tablet Take 1 tablet (70 mg total) by mouth every 7 (seven) days. Take with a full glass of water on an empty stomach. 12 tablet 3   loratadine-pseudoephedrine (CLARITIN-D 24 HOUR) 10-240 MG 24 hr tablet Take 1 tablet by mouth daily. 90 tablet 1   triamcinolone cream (KENALOG) 0.1 % Apply twice daily to rash up to 2 weeks/month as needed. 30 g 1   valACYclovir (VALTREX) 1000 MG tablet Take 2 tablets (2,000 mg total) by mouth 2 (two) times daily. At onset of cold sore 20 tablet 6   No current facility-administered medications for this visit.    Patient confirms/reports the following allergies:  No Known Allergies  No orders of the defined types were placed in this encounter.   AUTHORIZATION INFORMATION Primary Insurance: 1D#: Group #:  Secondary Insurance: 1D#: Group #:  SCHEDULE INFORMATION: Date: 02/02/2023  Time: Location: [facility]

## 2022-09-19 LAB — CBC WITH DIFFERENTIAL/PLATELET
Basophils Absolute: 0 10*3/uL (ref 0.0–0.2)
Basos: 1 %
EOS (ABSOLUTE): 0.1 10*3/uL (ref 0.0–0.4)
Eos: 1 %
Hematocrit: 40 % (ref 34.0–46.6)
Hemoglobin: 13.5 g/dL (ref 11.1–15.9)
Immature Grans (Abs): 0 10*3/uL (ref 0.0–0.1)
Immature Granulocytes: 0 %
Lymphocytes Absolute: 1.2 10*3/uL (ref 0.7–3.1)
Lymphs: 28 %
MCH: 30.8 pg (ref 26.6–33.0)
MCHC: 33.8 g/dL (ref 31.5–35.7)
MCV: 91 fL (ref 79–97)
Monocytes Absolute: 0.4 10*3/uL (ref 0.1–0.9)
Monocytes: 8 %
Neutrophils Absolute: 2.7 10*3/uL (ref 1.4–7.0)
Neutrophils: 62 %
Platelets: 192 10*3/uL (ref 150–450)
RBC: 4.38 x10E6/uL (ref 3.77–5.28)
RDW: 11.8 % (ref 11.7–15.4)
WBC: 4.4 10*3/uL (ref 3.4–10.8)

## 2022-09-19 LAB — COMPREHENSIVE METABOLIC PANEL
ALT: 12 IU/L (ref 0–32)
AST: 21 IU/L (ref 0–40)
Albumin/Globulin Ratio: 1.9 (ref 1.2–2.2)
Albumin: 4.3 g/dL (ref 3.8–4.9)
Alkaline Phosphatase: 60 IU/L (ref 44–121)
BUN/Creatinine Ratio: 16 (ref 9–23)
BUN: 12 mg/dL (ref 6–24)
Bilirubin Total: 0.8 mg/dL (ref 0.0–1.2)
CO2: 23 mmol/L (ref 20–29)
Calcium: 10 mg/dL (ref 8.7–10.2)
Chloride: 104 mmol/L (ref 96–106)
Creatinine, Ser: 0.77 mg/dL (ref 0.57–1.00)
Globulin, Total: 2.3 g/dL (ref 1.5–4.5)
Glucose: 86 mg/dL (ref 70–99)
Potassium: 4.3 mmol/L (ref 3.5–5.2)
Sodium: 141 mmol/L (ref 134–144)
Total Protein: 6.6 g/dL (ref 6.0–8.5)
eGFR: 93 mL/min/{1.73_m2} (ref >59)

## 2022-09-19 LAB — LIPID PANEL WITH LDL/HDL RATIO
Cholesterol, Total: 193 mg/dL (ref 100–199)
HDL: 52 mg/dL (ref >39)
LDL Chol Calc (NIH): 126 mg/dL — ABNORMAL HIGH (ref 0–99)
LDL/HDL Ratio: 2.4 ratio (ref 0.0–3.2)
Triglycerides: 80 mg/dL (ref 0–149)
VLDL Cholesterol Cal: 15 mg/dL (ref 5–40)

## 2022-09-19 LAB — VITAMIN D 25 HYDROXY (VIT D DEFICIENCY, FRACTURES): Vit D, 25-Hydroxy: 56.7 ng/mL (ref 30.0–100.0)

## 2022-09-19 LAB — CANCER ANTIGEN 27.29: CA 27.29: <9 U/mL (ref 0.0–38.6)

## 2022-09-19 LAB — TSH: TSH: 1.8 u[IU]/mL (ref 0.450–4.500)

## 2022-09-20 ENCOUNTER — Ambulatory Visit: Payer: 59 | Admitting: Family Medicine

## 2022-09-22 ENCOUNTER — Ambulatory Visit: Payer: Self-pay | Admitting: Sports Medicine

## 2022-09-22 ENCOUNTER — Other Ambulatory Visit: Payer: Self-pay

## 2022-09-22 ENCOUNTER — Encounter: Payer: Self-pay | Admitting: Family Medicine

## 2022-09-22 DIAGNOSIS — J302 Other seasonal allergic rhinitis: Secondary | ICD-10-CM

## 2022-09-22 DIAGNOSIS — M722 Plantar fascial fibromatosis: Secondary | ICD-10-CM

## 2022-09-22 NOTE — Progress Notes (Signed)
   [facility] [address] [place] [address] Phone: [phone]   Extracorporeal Shockwave Therapy Note    Patient is being treated today with ECSWT. Informed consent was obtained and patient tolerated procedure well.   Therapy performed by Richardean Sale  Condition treated: Plantar fascia, chronic Treatment preset used: Chronic plan fasciitis Energy used: 120 mJ Frequency used: 10 hz Number of pulses: 3000 Treatment #8 of #9  Electronically signed by:  [facility] 1:28 PM 09/22/22

## 2022-09-25 ENCOUNTER — Other Ambulatory Visit: Payer: Self-pay

## 2022-09-25 MED ORDER — CLARITIN-D 24 HOUR 10-240 MG PO TB24
1.0000 | ORAL_TABLET | Freq: Every day | ORAL | 1 refills | Status: DC
Start: 2022-09-25 — End: 2023-10-05
  Filled 2022-09-25 – 2022-09-29 (×2): qty 90, 90d supply, fill #0
  Filled 2023-03-09 – 2023-03-26 (×2): qty 90, 90d supply, fill #1

## 2022-09-28 NOTE — Progress Notes (Signed)
[name] [address] [place] [address] Phone: [phone] Subjective:   Bruce Donath, am serving as a scribe for Dr. Antoine Primas.  I'm seeing this patient by the request  of:  Bacigalupo, Marzella Schlein, MD  CC: Foot pain and back pain follow-up  [id number]  LERA GAINES is a 52 y.o. female coming in with complaint of back and neck pain. OMT 08/04/2022. Also f/u for plantar fasciitis, L. Patient states that she still has soreness in the mornings and it feels tighter than the other foot. Does feel that she is slowly improving. Walking for 30 minutes last week increased her pain.   Feels that it is time for adjustment.  Having more discomfort in the lower back.  Seems to be more right-sided SI joint.  Medications patient has been prescribed:   Taking:         Reviewed prior external information including notes and imaging from previsou exam, outside providers and external EMR if available.   As well as notes that were available from care everywhere and other healthcare systems.  Past medical history, social, surgical and family history all reviewed in electronic medical record.  No pertanent information unless stated regarding to the chief complaint.   Past Medical History:  Diagnosis Date   Allergy    Breast cancer left   2004 with chemo tx   Osteoporosis    Personal history of chemotherapy     No Known Allergies   Review of Systems:  No headache, visual changes, nausea, vomiting, diarrhea, constipation, dizziness, abdominal pain, skin rash, fevers, chills, night sweats, weight loss, swollen lymph nodes, body aches, joint swelling, chest pain, shortness of breath, mood changes. POSITIVE muscle aches  Objective  Blood pressure 128/78, pulse 77, height  (1.702 m), weight 159 lb (72.1 kg), SpO2 98 %.   General: No apparent distress alert and oriented x3 mood and affect normal, dressed appropriately.  HEENT: Pupils equal,  extraocular movements intact  Respiratory: Patient's speak in full sentences and does not appear short of breath  Cardiovascular: No lower extremity edema, non tender, no erythema  Low back exam does have some loss lordosis.  Some tenderness to palpation in the paraspinal musculature. Tightness with FABER test right greater than left.  Worsening pain with extension of the back    Osteopathic findings  C6 flexed rotated and side bent right T3 extended rotated and side bent right inhaled rib T8 extended rotated and side bent left L2 flexed rotated and side bent right Sacrum right on right  Foot exam shows the patient does have still some mild tenderness to palpation over the plantar aspect of the heel but improvement noted.  Some mild tightness of the posterior cord.  Limited muscular skeletal ultrasound was performed and interpreted by Antoine Primas, M  Limited ultrasound of patient's foot shows that patient does have significant decrease in the hyper calcific changes noted in the surrounding area but does have some swelling noted in the fat pad. Impression: Resolved calcific changes of the plantar fascia with continued irritation of the fat pad     Assessment and Plan:  Plantar fasciitis of left foot Significant improvement when it comes to the calcific changes noted in the area.  He does still have some hypoechoic changes and given prednisone at a low dose.  Hopefully this will make a significant improvement.  Discussed icing regimen and exercises.  Discussed continuing the rigid soled shoes and avoiding being  barefoot.  Follow-up again in 6 to  SI (sacroiliac) joint dysfunction Continue tightness with some discussed home exercises and icing regimen, which activities to do and which ones to avoid.  Patient continues to be active.  Responds well to osteopathic manipulation.  Likely no radicular symptoms at the moment.  Has had gluteal tendinitis will need to continue to monitor.   Follow-up again in 6 to 8 weeks    Nonallopathic problems  Decision today to treat with OMT was based on Physical Exam  After verbal consent patient was treated with HVLA, ME, FPR techniques in cervical, rib, thoracic, lumbar, and sacral  areas  Patient tolerated the procedure well with improvement in symptoms  Patient given exercises, stretches and lifestyle modifications  See medications in patient instructions if given  Patient will follow up in 4-8 weeks    The above documentation has been reviewed and is accurate and complete Judi Saa, DO          Note: This dictation was prepared with Dragon dictation along with smaller phrase technology. Any transcriptional errors that result from this process are unintentional.

## 2022-09-29 ENCOUNTER — Other Ambulatory Visit: Payer: Self-pay

## 2022-09-29 ENCOUNTER — Ambulatory Visit: Payer: Self-pay | Admitting: Family Medicine

## 2022-09-29 DIAGNOSIS — M722 Plantar fascial fibromatosis: Secondary | ICD-10-CM

## 2022-09-29 NOTE — Progress Notes (Signed)
   [name] [address] [place] [address] Phone: [phone]   Extracorporeal Shockwave Therapy Note    Patient is being treated today with ECSWT. Informed consent was obtained and patient tolerated procedure well.   Therapy performed by Clementeen Graham  Condition treated: Left plantar fasciitis Treatment preset used: Modification of Planter fasciitis preset Energy used: 120 mJ Frequency used: 10 Hz Number of pulses: 3000 Treatment #8 of #8  Electronically signed by:  [name] 7:53 AM 09/29/22

## 2022-10-02 ENCOUNTER — Other Ambulatory Visit: Payer: Self-pay

## 2022-10-03 ENCOUNTER — Ambulatory Visit: Payer: 59 | Admitting: Family Medicine

## 2022-10-03 ENCOUNTER — Other Ambulatory Visit: Payer: Self-pay

## 2022-10-03 ENCOUNTER — Encounter: Payer: Self-pay | Admitting: Family Medicine

## 2022-10-03 VITALS — BP 128/78 | HR 77 | Ht 67.0 in | Wt 159.0 lb

## 2022-10-03 DIAGNOSIS — M9903 Segmental and somatic dysfunction of lumbar region: Secondary | ICD-10-CM | POA: Diagnosis not present

## 2022-10-03 DIAGNOSIS — M9908 Segmental and somatic dysfunction of rib cage: Secondary | ICD-10-CM | POA: Diagnosis not present

## 2022-10-03 DIAGNOSIS — M722 Plantar fascial fibromatosis: Secondary | ICD-10-CM

## 2022-10-03 DIAGNOSIS — M79672 Pain in left foot: Secondary | ICD-10-CM

## 2022-10-03 DIAGNOSIS — M533 Sacrococcygeal disorders, not elsewhere classified: Secondary | ICD-10-CM | POA: Diagnosis not present

## 2022-10-03 DIAGNOSIS — M9901 Segmental and somatic dysfunction of cervical region: Secondary | ICD-10-CM

## 2022-10-03 DIAGNOSIS — M9902 Segmental and somatic dysfunction of thoracic region: Secondary | ICD-10-CM

## 2022-10-03 DIAGNOSIS — M9904 Segmental and somatic dysfunction of sacral region: Secondary | ICD-10-CM

## 2022-10-03 MED ORDER — PREDNISONE 20 MG PO TABS
20.0000 mg | ORAL_TABLET | Freq: Every day | ORAL | 0 refills | Status: DC
  Filled 2022-10-03: qty 5, 5d supply, fill #0

## 2022-10-03 NOTE — Patient Instructions (Signed)
Prednisone  for 5 days Everything else looks better See me again in June

## 2022-10-03 NOTE — Assessment & Plan Note (Signed)
Significant improvement when it comes to the calcific changes noted in the area.  He does still have some hypoechoic changes and given prednisone at a low dose.  Hopefully this will make a significant improvement.  Discussed icing regimen and exercises.  Discussed continuing the rigid soled shoes and avoiding being barefoot.  Follow-up again in 6 to

## 2022-10-03 NOTE — Assessment & Plan Note (Signed)
Continue tightness with some discussed home exercises and icing regimen, which activities to do and which ones to avoid.  Patient continues to be active.  Responds well to osteopathic manipulation.  Likely no radicular symptoms at the moment.  Has had gluteal tendinitis will need to continue to monitor.  Follow-up again in 6 to 8 weeks

## 2022-10-05 ENCOUNTER — Ambulatory Visit: Payer: 59 | Admitting: Family Medicine

## 2022-11-23 NOTE — Progress Notes (Signed)
  [name] [address] [place] [address] Phone: [phone] Subjective:   [name], am serving as a scribe for Dr. Antoine Primas.  I'm seeing this patient by the request  of:  Bacigalupo, Marzella Schlein, MD  CC: Back and neck pain follow-up  [id number]  Kendra [name] is a 52 y.o. female coming in with complaint of back and neck pain. OMT On 10/03/2022. Also seen for L foot pain. Patient states same per usual. In the morning still having some pain with toe off and after resting for a while.           Past Medical History:  Diagnosis Date   Allergy    Breast cancer (HCC) left   2004 with chemo tx   Osteoporosis    Personal history of chemotherapy     No Known Allergies     Objective  Blood pressure 118/76, pulse 74, height 5\' 7"  (1.702 m), weight 157 lb (71.2 kg), SpO2 98 %.   General: No apparent distress alert and oriented x3 mood and affect normal, dressed appropriately.  HEENT: Pupils equal, extraocular movements intact  Respiratory: Patient's speak in full sentences and does not appear short of breath  Cardiovascular: No lower extremity edema, non tender, no erythema  Neck exam does have some limited sidebending bilaterally.  Patient does have some crepitus noted.  Patient does have some tenderness to palpation mostly from L4-S1 bilaterally in the paraspinal musculature.  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  Discussed icing regimen and home exercises, discussed which activities to do and which ones to avoid.  Patient is likely been able to continue to stay active.  Follow-up with me again in 6 to 8 weeks otherwise.    Nonallopathic  problems  Decision today to treat with OMT was based on Physical Exam  After verbal consent patient was treated with HVLA, ME, FPR techniques in cervical, rib, thoracic, lumbar, and sacral  areas  Patient tolerated the procedure well with improvement in symptoms  Patient given exercises, stretches and lifestyle modifications  See medications in patient instructions if given  Patient will follow up in 4-8 weeks     The above documentation has been reviewed and is accurate and complete Judi Saa, DO         Note: This dictation was prepared with Dragon dictation along with smaller phrase technology. Any transcriptional errors that result from this process are unintentional.

## 2022-11-24 ENCOUNTER — Encounter: Payer: Self-pay | Admitting: Family Medicine

## 2022-11-24 ENCOUNTER — Ambulatory Visit: Payer: 59 | Admitting: Family Medicine

## 2022-11-24 ENCOUNTER — Ambulatory Visit: Payer: Self-pay

## 2022-11-24 VITALS — BP 118/76 | HR 74 | Ht 67.0 in | Wt 157.0 lb

## 2022-11-24 DIAGNOSIS — M9901 Segmental and somatic dysfunction of cervical region: Secondary | ICD-10-CM

## 2022-11-24 DIAGNOSIS — M9904 Segmental and somatic dysfunction of sacral region: Secondary | ICD-10-CM | POA: Diagnosis not present

## 2022-11-24 DIAGNOSIS — M533 Sacrococcygeal disorders, not elsewhere classified: Secondary | ICD-10-CM

## 2022-11-24 DIAGNOSIS — M9902 Segmental and somatic dysfunction of thoracic region: Secondary | ICD-10-CM | POA: Diagnosis not present

## 2022-11-24 DIAGNOSIS — M9903 Segmental and somatic dysfunction of lumbar region: Secondary | ICD-10-CM

## 2022-11-24 DIAGNOSIS — M79672 Pain in left foot: Secondary | ICD-10-CM

## 2022-11-24 DIAGNOSIS — M9908 Segmental and somatic dysfunction of rib cage: Secondary | ICD-10-CM

## 2022-11-24 NOTE — Patient Instructions (Signed)
Good to see you! Good luck with [place] See you again in 6-8 weeks

## 2022-11-24 NOTE — Assessment & Plan Note (Signed)
Chronic problem with exacerbation noted.  Discussed icing regimen and home exercises, discussed which activities to do and which ones to avoid.  Patient is likely been able to continue to stay active.  Follow-up with me again in 6 to 8 weeks otherwise.

## 2022-12-05 ENCOUNTER — Telehealth: Payer: 59 | Admitting: Family Medicine

## 2022-12-05 ENCOUNTER — Telehealth: Payer: 59 | Admitting: Physician Assistant

## 2022-12-05 ENCOUNTER — Other Ambulatory Visit: Payer: Self-pay

## 2022-12-05 DIAGNOSIS — R3989 Other symptoms and signs involving the genitourinary system: Secondary | ICD-10-CM

## 2022-12-05 DIAGNOSIS — R399 Unspecified symptoms and signs involving the genitourinary system: Secondary | ICD-10-CM

## 2022-12-05 MED ORDER — CEPHALEXIN 500 MG PO CAPS
500.0000 mg | ORAL_CAPSULE | Freq: Two times a day (BID) | ORAL | 0 refills | Status: AC
Start: 2022-12-05 — End: 2022-12-12
  Filled 2022-12-05: qty 14, 7d supply, fill #0

## 2022-12-05 NOTE — Progress Notes (Signed)
Hi Becca,  Based on the answers you have given it seems you are possibly dealing with more urinary symptoms than any true vaginitis. We request that you fill out an e-visit for UTI symptoms so we can better assess what is currently going on. You will not be charged for this e-visit submission, and will only be charged once we get the new submission and have assessed and started treatment.

## 2022-12-05 NOTE — Progress Notes (Signed)

## 2022-12-07 ENCOUNTER — Ambulatory Visit
Admission: RE | Admit: 2022-12-07 | Discharge: 2022-12-07 | Disposition: A | Discharge status: Home / Self Care | Payer: 59 | Source: Ambulatory Visit | Attending: Family Medicine | Admitting: Family Medicine

## 2022-12-07 DIAGNOSIS — M818 Other osteoporosis without current pathological fracture: Secondary | ICD-10-CM | POA: Insufficient documentation

## 2022-12-07 DIAGNOSIS — Z1231 Encounter for screening mammogram for malignant neoplasm of breast: Secondary | ICD-10-CM

## 2022-12-07 DIAGNOSIS — Z780 Asymptomatic menopausal state: Secondary | ICD-10-CM | POA: Diagnosis not present

## 2022-12-07 DIAGNOSIS — M810 Age-related osteoporosis without current pathological fracture: Secondary | ICD-10-CM | POA: Diagnosis not present

## 2022-12-27 ENCOUNTER — Other Ambulatory Visit: Payer: Self-pay

## 2022-12-27 ENCOUNTER — Telehealth: Payer: 59 | Admitting: Nurse Practitioner

## 2022-12-27 DIAGNOSIS — B9789 Other viral agents as the cause of diseases classified elsewhere: Secondary | ICD-10-CM | POA: Diagnosis not present

## 2022-12-27 DIAGNOSIS — J329 Chronic sinusitis, unspecified: Secondary | ICD-10-CM

## 2022-12-27 MED ORDER — IPRATROPIUM BROMIDE 0.03 % NA SOLN
2.0000 | Freq: Two times a day (BID) | NASAL | 12 refills | Status: AC
Start: 2022-12-27 — End: ?
  Filled 2022-12-27 – 2023-02-14 (×2): qty 30, 75d supply, fill #0

## 2022-12-27 NOTE — Progress Notes (Signed)
E-Visit for Sinus Problems  We are sorry that you are not feeling well.  Here is how we plan to help!  Based on what you have shared with me it looks like you have sinusitis.  Sinusitis is inflammation and infection in the sinus cavities of the head.  Based on your presentation I believe you most likely have Acute Viral Sinusitis.This is an infection most likely caused by a virus. There is not specific treatment for viral sinusitis other than to help you with the symptoms until the infection runs its course.  You may use an oral decongestant such as Mucinex D or if you have glaucoma or high blood pressure use plain Mucinex. Saline nasal spray help and can safely be used as often as needed for congestion, I have prescribed: Ipratropium Bromide nasal spray 0.03% 2 sprays in eah nostril 2-3 times a day  Some authorities believe that zinc sprays or the use of Echinacea may shorten the course of your symptoms.  Sinus infections are not as easily transmitted as other respiratory infection, however we still recommend that you avoid close contact with loved ones, especially the very young and elderly.  Remember to wash your hands thoroughly throughout the day as this is the number one way to prevent the spread of infection!  Home Care: Only take medications as instructed by your medical team. Do not take these medications with alcohol. A steam or ultrasonic humidifier can help congestion.  You can place a towel over your head and breathe in the steam from hot water coming from a faucet. Avoid close contacts especially the very young and the elderly. Cover your mouth when you cough or sneeze. Always remember to wash your hands.  Get Help Right Away If: You develop worsening fever or sinus pain. You develop a severe head ache or visual changes. Your symptoms persist after you have completed your treatment plan.  Make sure you Understand these instructions. Will watch your condition. Will get help  right away if you are not doing well or get worse.   Thank you for choosing an e-visit.  Your e-visit answers were reviewed by a board certified advanced clinical practitioner to complete your personal care plan. Depending upon the condition, your plan could have included both over the counter or prescription medications.  Please review your pharmacy choice. Make sure the pharmacy is open so you can pick up prescription now. If there is a problem, you may contact your provider through [employer] and have the prescription routed to another pharmacy.  Your safety is important to [place]. If you have drug allergies check your prescription carefully.   For the next 24 hours you can use MyChart to ask questions about today's visit, request a non-urgent call back, or ask for a work or school excuse. You will get an email in the next two days asking about your experience. I hope that your e-visit has been valuable and will speed your recovery.  Meds ordered this encounter  Medications   ipratropium (ATROVENT) 0.03 % nasal spray    Sig: Place 2 sprays into both nostrils every 12 (twelve) hours.    Dispense:  30 mL    Refill:  12    I spent approximately 5 minutes reviewing the patient's history, current symptoms and coordinating their care today.

## 2022-12-28 NOTE — Progress Notes (Signed)
  [name] [address] [place] [address] Phone: [phone] Subjective:    I'm seeing this patient by the request  of:  Erasmo Downer, MD  CC: Back pain follow-up  [id number]  Kendra [name] is a 52 y.o. female coming in with complaint of back and neck pain. OMT on 11/24/2022. Patient states improvement of sx with OMT. Notes worsening neck and lower back pain over the past couple of weeks.   Medications patient has been prescribed: Prednisone 10/03/22  Taking: no OTC meds         Reviewed prior external information including notes and imaging from previsou exam, outside providers and external EMR if available.   As well as notes that were available from care everywhere and other healthcare systems.  Past medical history, social, surgical and family history all reviewed in electronic medical record.  No pertanent information unless stated regarding to the chief complaint.   Past Medical History:  Diagnosis Date   Allergy    Breast cancer (HCC) left   2004 with chemo tx   Osteoporosis    Personal history of chemotherapy     No Known Allergies   Review of Systems:  No headache, visual changes, nausea, vomiting, diarrhea, constipation, dizziness, abdominal pain, skin rash, fevers, chills, night sweats, weight loss, swollen lymph nodes, body aches, joint swelling, chest pain, shortness of breath, mood changes. POSITIVE muscle aches  Objective  Blood pressure 118/72, pulse 69, height 5\' 7"  (1.702 m), weight 153 lb (69.4 kg), SpO2 99%.   General: No apparent distress alert and oriented x3 mood and affect normal, dressed appropriately.  HEENT: Pupils equal, extraocular movements intact  Respiratory: Patient's speak in full sentences and does not appear short of breath  Cardiovascular: No lower extremity edema, non tender, no erythema  Low back does have some loss lordosis.  Tightness noted around the right sacroiliac  joint.  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  Discussed icing regimen and home exercises.  Discussed which activities to do and which ones to avoid.  Continue to work on core strength.  Worsening pain can consider changes in medications.  Follow-up again in 6 to 8 weeks    Nonallopathic problems  Decision today to treat with OMT was based on Physical Exam  After verbal consent patient was treated with HVLA, ME, FPR techniques in cervical, rib, thoracic, lumbar, and sacral  areas  Patient tolerated the procedure well with improvement in symptoms  Patient given exercises, stretches and lifestyle modifications  See medications in patient instructions if given  Patient will follow up in 4-8 weeks      The above documentation has been reviewed and is accurate and complete Kendra Saa, DO        Note: This dictation was prepared with Dragon dictation along with smaller phrase technology. Any transcriptional errors that result from this process are unintentional.

## 2023-01-05 ENCOUNTER — Ambulatory Visit: Payer: 59 | Admitting: Family Medicine

## 2023-01-05 ENCOUNTER — Other Ambulatory Visit: Payer: Self-pay

## 2023-01-05 ENCOUNTER — Encounter: Payer: Self-pay | Admitting: Family Medicine

## 2023-01-05 VITALS — BP 118/72 | HR 69 | Ht 67.0 in | Wt 153.0 lb

## 2023-01-05 DIAGNOSIS — M9908 Segmental and somatic dysfunction of rib cage: Secondary | ICD-10-CM | POA: Diagnosis not present

## 2023-01-05 DIAGNOSIS — M533 Sacrococcygeal disorders, not elsewhere classified: Secondary | ICD-10-CM | POA: Diagnosis not present

## 2023-01-05 DIAGNOSIS — M9901 Segmental and somatic dysfunction of cervical region: Secondary | ICD-10-CM

## 2023-01-05 DIAGNOSIS — M9902 Segmental and somatic dysfunction of thoracic region: Secondary | ICD-10-CM

## 2023-01-05 DIAGNOSIS — M9903 Segmental and somatic dysfunction of lumbar region: Secondary | ICD-10-CM | POA: Diagnosis not present

## 2023-01-05 NOTE — Assessment & Plan Note (Signed)
Chronic problem with exacerbation.  Discussed icing regimen and home exercises.  Discussed which activities to do and which ones to avoid.  Continue to work on core strength.  Worsening pain can consider changes in medications.  Follow-up again in 6 to 8 weeks

## 2023-01-26 ENCOUNTER — Other Ambulatory Visit: Payer: Self-pay | Admitting: *Deleted

## 2023-01-26 ENCOUNTER — Other Ambulatory Visit: Payer: Self-pay

## 2023-01-26 MED ORDER — NA SULFATE-K SULFATE-MG SULF 17.5-3.13-1.6 GM/177ML PO SOLN
1.0000 | Freq: Once | ORAL | 0 refills | Status: AC
  Filled 2023-01-26: qty 354, 1d supply, fill #0

## 2023-01-30 ENCOUNTER — Encounter: Payer: Self-pay | Admitting: Family Medicine

## 2023-01-31 ENCOUNTER — Encounter: Payer: Self-pay | Admitting: Family Medicine

## 2023-02-01 ENCOUNTER — Encounter: Payer: Self-pay | Admitting: Gastroenterology

## 2023-02-02 ENCOUNTER — Ambulatory Visit
Admission: RE | Admit: 2023-02-02 | Discharge: 2023-02-02 | Disposition: A | Discharge status: Home / Self Care | Payer: 59 | Attending: Gastroenterology | Admitting: Gastroenterology

## 2023-02-02 ENCOUNTER — Encounter
Admission: RE | Disposition: A | Discharge status: Home / Self Care | Payer: Self-pay | Source: Home / Self Care | Attending: Gastroenterology

## 2023-02-02 ENCOUNTER — Other Ambulatory Visit: Payer: Self-pay

## 2023-02-02 ENCOUNTER — Ambulatory Visit: Payer: 59 | Admitting: Certified Registered"

## 2023-02-02 ENCOUNTER — Encounter: Payer: Self-pay | Admitting: Gastroenterology

## 2023-02-02 DIAGNOSIS — Z09 Encounter for follow-up examination after completed treatment for conditions other than malignant neoplasm: Secondary | ICD-10-CM | POA: Insufficient documentation

## 2023-02-02 DIAGNOSIS — Z1211 Encounter for screening for malignant neoplasm of colon: Secondary | ICD-10-CM | POA: Insufficient documentation

## 2023-02-02 DIAGNOSIS — Z860100 Personal history of colon polyps, unspecified: Secondary | ICD-10-CM

## 2023-02-02 HISTORY — PX: COLONOSCOPY WITH PROPOFOL: SHX5780

## 2023-02-02 SURGERY — COLONOSCOPY WITH PROPOFOL
Anesthesia: General

## 2023-02-02 MED ORDER — SODIUM CHLORIDE 0.9 % IV SOLN: INTRAVENOUS | Status: DC

## 2023-02-02 MED ORDER — PROPOFOL 1000 MG/100ML IV EMUL
INTRAVENOUS | Status: AC
  Filled 2023-02-02: qty 200

## 2023-02-02 MED ORDER — PROPOFOL 500 MG/50ML IV EMUL
INTRAVENOUS | Status: DC | PRN
  Administered 2023-02-02: 140 ug/kg/min via INTRAVENOUS

## 2023-02-02 MED ORDER — PROPOFOL 10 MG/ML IV BOLUS
INTRAVENOUS | Status: DC | PRN
  Administered 2023-02-02: 30 mg via INTRAVENOUS
  Administered 2023-02-02 (×2): 20 mg via INTRAVENOUS

## 2023-02-02 NOTE — Anesthesia Postprocedure Evaluation (Signed)
Anesthesia Post Note  [name]  Procedure(s) Performed: COLONOSCOPY WITH PROPOFOL  Patient location during evaluation: Endoscopy Anesthesia Type: General Level of consciousness: awake and alert Pain management: pain level controlled Vital Signs Assessment: post-procedure vital signs reviewed and stable Respiratory status: spontaneous breathing, nonlabored ventilation, respiratory function stable and patient connected to nasal cannula oxygen Cardiovascular status: blood pressure returned to baseline and stable Postop Assessment: no apparent nausea or vomiting Anesthetic complications: no   No notable events documented.   Last Vitals:  Vitals:   02/02/23 0812 02/02/23 0822  BP: 99/61 119/63  Pulse: 70 66  Resp: 20 13  Temp: (!) 35.9 C   SpO2: 100% 100%    Last Pain:  Vitals:   02/02/23 0822  TempSrc:   PainSc: 0-No pain                 Louie Boston

## 2023-02-02 NOTE — Anesthesia Preprocedure Evaluation (Signed)
Anesthesia Evaluation  Patient identified by MRN, date of birth, ID band Patient awake    Reviewed: Allergy & Precautions, NPO status , Patient's Chart, lab work & pertinent test results  History of Anesthesia Complications Negative for: history of anesthetic complications  Airway Mallampati: II  TM Distance: >3 FB Neck ROM: full    Dental no notable dental hx.    Pulmonary neg pulmonary ROS   Pulmonary exam normal        Cardiovascular negative cardio ROS Normal cardiovascular exam     Neuro/Psych negative neurological ROS  negative psych ROS   GI/Hepatic negative GI ROS, Neg liver ROS,,,  Endo/Other  negative endocrine ROS    Renal/GU negative Renal ROS  negative genitourinary   Musculoskeletal   Abdominal   Peds  Hematology negative hematology ROS (+)   Anesthesia Other Findings Past Medical History: No date: Allergy left: Breast cancer [facility])     Comment:  2004 with chemo tx No date: Osteoporosis No date: Personal history of chemotherapy  Past Surgical History: No date: BREAST SURGERY     Comment:  implant left in 2004, and biopsy 12/31/2020: COLONOSCOPY WITH PROPOFOL; N/A     Comment:  Procedure: COLONOSCOPY WITH PROPOFOL;  Surgeon:               Pasty Spillers, MD;  Location: [facility] ENDOSCOPY;                Service: Gastroenterology;  Laterality: N/A; 01/07/03: MASTECTOMY; Left     Comment:  with tram flap and right breast lift in 2008 No date: TOTAL ABDOMINAL HYSTERECTOMY     Comment:  related to breast cancer 10/2003: TOTAL ABDOMINAL HYSTERECTOMY W/ BILATERAL SALPINGOOPHORECTOMY  BMI    Body Mass Index: 23.34 kg/m      Reproductive/Obstetrics negative OB ROS                             Anesthesia Physical Anesthesia Plan  ASA: 1  Anesthesia Plan: General   Post-op Pain Management: Minimal or no pain anticipated   Induction: Intravenous  PONV Risk Score  and Plan: Propofol infusion and TIVA  Airway Management Planned: Natural Airway and Nasal Cannula  Additional Equipment:   Intra-op Plan:   Post-operative Plan:   Informed Consent: I have reviewed the patients History and Physical, chart, labs and discussed the procedure including the risks, benefits and alternatives for the proposed anesthesia with the patient or authorized representative who has indicated his/her understanding and acceptance.     Dental Advisory Given  Plan Discussed with: Anesthesiologist, CRNA and Surgeon  Anesthesia Plan Comments: (Patient consented for risks of anesthesia including but not limited to:  - adverse reactions to medications - risk of airway placement if required - damage to eyes, teeth, lips or other oral mucosa - nerve damage due to positioning  - sore throat or hoarseness - Damage to heart, brain, nerves, lungs, other parts of body or loss of life  Patient voiced understanding.)       Anesthesia Quick Evaluation

## 2023-02-02 NOTE — Anesthesia Procedure Notes (Signed)
Procedure Name: MAC Date/Time: 02/02/2023 7:48 AM  Performed by: Nelle Don, CRNAPre-anesthesia Checklist: Patient identified, Emergency Drugs available, Suction available and Patient being monitored Oxygen Delivery Method: Simple face mask

## 2023-02-02 NOTE — H&P (Signed)
Wyline Mood, MD [address], [address], [place], [place], [address] [address], [address], [place], [place], [address] Phone: [phone]  Fax: [phone]  Primary Care Physician:  Erasmo Downer, MD   Pre-Procedure History & Physical: HPI:  Kendra [name] is a 52 y.o. female is here for an colonoscopy.   Past Medical History:  Diagnosis Date   Allergy    Breast cancer (HCC) left   2004 with chemo tx   Osteoporosis    Personal history of chemotherapy     Past Surgical History:  Procedure Laterality Date   BREAST SURGERY     implant left in 2004, and biopsy   COLONOSCOPY WITH PROPOFOL N/A 12/31/2020   Procedure: COLONOSCOPY WITH PROPOFOL;  Surgeon: Pasty Spillers, MD;  Location: [facility] ENDOSCOPY;  Service: Gastroenterology;  Laterality: N/A;   MASTECTOMY Left 01/07/03   with tram flap and right breast lift in 2008   TOTAL ABDOMINAL HYSTERECTOMY     related to breast cancer   TOTAL ABDOMINAL HYSTERECTOMY W/ BILATERAL SALPINGOOPHORECTOMY  10/2003    Prior to Admission medications   Medication Sig Start Date End Date Taking? Authorizing Provider  alendronate (FOSAMAX) 70 MG tablet Take 1 tablet (70 mg total) by mouth every 7 (seven) days. Take with a full glass of water on an empty stomach. 09/11/22  Yes Bacigalupo, Marzella Schlein, MD  ipratropium (ATROVENT) 0.03 % nasal spray Place 2 sprays into both nostrils every 12 (twelve) hours. 12/27/22  Yes Viviano Simas, FNP  loratadine-pseudoephedrine (CLARITIN-D 24 HOUR) 10-240 MG 24 hr tablet Take 1 tablet by mouth daily. 09/25/22  Yes Bacigalupo, Marzella Schlein, MD  valACYclovir (VALTREX) 1000 MG tablet Take 2 tablets (2,000 mg total) by mouth 2 (two) times daily. At onset of cold sore 01/29/20  Yes Margaretann Loveless, PA-C  predniSONE (DELTASONE) 20 MG tablet Take 1 tablet (20 mg total) by mouth daily with breakfast. Patient not taking: Reported on 01/26/2023 10/03/22   Judi Saa, DO  triamcinolone cream (KENALOG) 0.1 % Apply  twice daily to rash up to 2 weeks/month as needed. 06/16/22       Allergies as of 09/18/2022   (No Known Allergies)    Family History  Problem Relation Age of Onset   Arthritis Mother    Hypothyroidism Mother    Breast cancer Mother 42   Alcohol abuse Brother     Social History   Socioeconomic History   Marital status: Married    Spouse name: Eldana Lebowitz   Number of children: 2   Years of education: Not on file   Highest education level: Not on file  Occupational History   Not on file  Tobacco Use   Smoking status: Never   Smokeless tobacco: Never  Vaping Use   Vaping status: Never Used  Substance and Sexual Activity   Alcohol use: Yes    Alcohol/week: 2.0 standard drinks of alcohol    Types: 2 Glasses of wine per week   Drug use: No   Sexual activity: Yes    Birth control/protection: Other-see comments    Comment: had total hysterectomy  Other Topics Concern   Not on file  Social History Narrative   Not on file   Social Determinants of Health   Financial Resource Strain: Not on file  Food Insecurity: Not on file  Transportation Needs: Not on file  Physical Activity: Not on file  Stress: Not on file  Social Connections: Not on file  Intimate Partner Violence:  Not on file    Review of Systems: See HPI, otherwise negative ROS  Physical Exam: BP 128/73   Pulse (!) 57   Temp (!) 97.4 F (36.3 C) (Temporal)   Resp 18   Ht 5\' 7"  (1.702 m)   Wt 67.6 kg   SpO2 100%   BMI 23.34 kg/m  General:   Alert,  pleasant and cooperative in NAD Head:  Normocephalic and atraumatic. Neck:  Supple; no masses or thyromegaly. Lungs:  Clear throughout to auscultation, normal respiratory effort.    Heart:  +S1, +S2, Regular rate and rhythm, No edema. Abdomen:  Soft, nontender and nondistended. Normal bowel sounds, without guarding, and without rebound.   Neurologic:  Alert and  oriented x4;  grossly normal neurologically.  Impression/Plan: Kendra [name] is here for an  colonoscopy to be performed for surveillance due to prior history of colon polyps   Risks, benefits, limitations, and alternatives regarding  colonoscopy have been reviewed with the patient.  Questions have been answered.  All parties agreeable.   Wyline Mood, MD  02/02/2023, 7:46 AM

## 2023-02-02 NOTE — Op Note (Signed)
[facility] Patient Name: Kendra [name] Procedure Date: 02/02/2023 6:46 AM MRN: [id number] Account #: [id number] Date of Birth: [birth year] Admit Type: Outpatient Age: 52 Room: [facility] ENDO ROOM 4 Gender: Female Note Status: Finalized Instrument Name: Prentice Docker [id number] Procedure:             Colonoscopy Indications:           Surveillance: Piecemeal removal of large sessile                         adenoma last colonoscopy (< 3 yrs) Providers:             Wyline Mood MD, MD Referring MD:          Marzella Schlein. Bacigalupo (Referring MD) Medicines:             Monitored Anesthesia Care Complications:         No immediate complications. Procedure:             Pre-Anesthesia Assessment:                        - Prior to the procedure, a History and Physical was                         performed, and patient medications, allergies and                         sensitivities were reviewed. The patient's tolerance                         of previous anesthesia was reviewed.                        - The risks and benefits of the procedure and the                         sedation options and risks were discussed with the                         patient. All questions were answered and informed                         consent was obtained.                        - ASA Grade Assessment: II - A patient with mild                         systemic disease.                        After obtaining informed consent, the colonoscope was                         passed under direct vision. Throughout the procedure,                         the patient's blood pressure, pulse, and oxygen                         saturations  were monitored continuously. The                         Colonoscope was introduced through the anus and                         advanced to the the cecum, identified by the                         appendiceal orifice. The colonoscopy was performed                          with ease. The patient tolerated the procedure well.                         The quality of the bowel preparation was good. The                         ileocecal valve, appendiceal orifice, and rectum were                         photographed. Findings:      The perianal and digital rectal examinations were normal.      The entire examined colon appeared normal on direct and retroflexion       views. Impression:            - The entire examined colon is normal on direct and                         retroflexion views.                        - No specimens collected. Recommendation:        - Discharge patient to home (with escort).                        - Resume previous diet.                        - Continue present medications.                        - Repeat colonoscopy in 3 years for surveillance. Procedure Code(s):     --- Professional ---                        [phone], Colonoscopy, flexible; diagnostic, including                         collection of specimen(s) by brushing or washing, when                         performed (separate procedure) Diagnosis Code(s):     --- Professional ---                        Z86.010, Personal history of colonic polyps CPT copyright 2022 American Medical Association. All rights reserved. The codes documented in this report are preliminary and upon coder review may  be revised to meet current compliance requirements. Wyline Mood, MD Wyline Mood MD,  MD 02/02/2023 8:10:28 AM This report has been signed electronically. Number of Addenda: 0 Note Initiated On: 02/02/2023 6:46 AM Scope Withdrawal Time: 0 hours 10 minutes 42 seconds  Total Procedure Duration: 0 hours 16 minutes 26 seconds  Estimated Blood Loss:  Estimated blood loss: none.      [facility]

## 2023-02-02 NOTE — Transfer of Care (Signed)
Immediate Anesthesia Transfer of Care Note  [name]  Procedure(s) Performed: COLONOSCOPY WITH PROPOFOL  Patient Location: PACU  Anesthesia Type:General  Level of Consciousness: awake, alert , and oriented  Airway & Oxygen Therapy: Patient Spontanous Breathing and Patient connected to face mask oxygen  Post-op Assessment: Report given to RN, Post -op Vital signs reviewed and stable, and Patient moving all extremities X 4  Post vital signs: Reviewed and stable  Last Vitals:  Vitals Value Taken Time  BP 99/61   Temp    Pulse 70   Resp 12   SpO2 100     Last Pain:  Vitals:   02/02/23 0706  TempSrc: Temporal  PainSc: 0-No pain         Complications: No notable events documented.

## 2023-02-05 ENCOUNTER — Encounter: Payer: Self-pay | Admitting: Gastroenterology

## 2023-02-07 ENCOUNTER — Ambulatory Visit (INDEPENDENT_AMBULATORY_CARE_PROVIDER_SITE_OTHER): Payer: Self-pay | Admitting: Family Medicine

## 2023-02-07 DIAGNOSIS — M722 Plantar fascial fibromatosis: Secondary | ICD-10-CM

## 2023-02-07 NOTE — Progress Notes (Signed)
   [facility] [address] [place] [address] Phone: [phone]   Extracorporeal Shockwave Therapy Note    Patient is being treated today with ECSWT. Informed consent was obtained and patient tolerated procedure well.   Therapy performed by Clementeen Graham  Condition treated: Left plantar fascia  Treatment preset used: Heel spur Energy used: 120 mJ Frequency used: 12 Hz Number of pulses: 3000 Head Size: Medium Treatment #1 of #4  Electronically signed by:  [facility] 1:08 PM 02/07/23  She is going to [employer] September 21.  She is seeing Dr. Katrinka Blazing September 6.  Consider plantar fascia injection at that visit if possible to help reduce pain prior to her trip.  Happy to do the injection myself if needed.

## 2023-02-08 NOTE — Progress Notes (Signed)
[name] [address] [place] [address] Phone: [phone] Subjective:   Bruce Donath, am serving as a scribe for Dr. Antoine Primas.  I'm seeing this patient by the request  of:  Bacigalupo, Marzella Schlein, MD  CC: Foot pain, back pain  [id number]  Kendra [name] is a 52 y.o. female coming in with complaint of back and neck pain. OMT on 01/05/2023. Saw Dr. Denyse Amass for foot pain. Patient states that her back is the same as last visit.   No change with shockwave therapy recently. She wants to do injection today. Tightness in the arch of her foot.   M        Reviewed prior external information including notes and imaging from previsou exam, outside providers and external EMR if available.   As well as notes that were available from care everywhere and other healthcare systems.  Past medical history, social, surgical and family history all reviewed in electronic medical record.  No pertanent information unless stated regarding to the chief complaint.   Past Medical History:  Diagnosis Date   Allergy    Breast cancer (HCC) left   2004 with chemo tx   Osteoporosis    Personal history of chemotherapy     No Known Allergies   Review of Systems:  No headache, visual changes, nausea, vomiting, diarrhea, constipation, dizziness, abdominal pain, skin rash, fevers, chills, night sweats, weight loss, swollen lymph nodes, body aches, joint swelling, chest pain, shortness of breath, mood changes. POSITIVE muscle aches  Objective  Blood pressure 120/84, pulse 61, height 5\' 7"  (1.702 m), weight 157 lb (71.2 kg), SpO2 100%.   General: No apparent distress alert and oriented x3 mood and affect normal, dressed appropriately.  HEENT: Pupils equal, extraocular movements intact  Respiratory: Patient's speak in full sentences and does not appear short of breath  Cardiovascular: No lower extremity edema, non tender, no erythema  Foot exam does show the  patient does have tenderness to palpation over the calcaneal area.  Patient does have tenderness noted  Osteopathic findings  C2 flexed rotated and side bent right C7 flexed rotated and side bent left T3 extended rotated and side bent right inhaled rib T8 extended rotated and side bent left L2 flexed rotated and side bent right Sacrum right on right   Procedure: Real-time Ultrasound Guided Injection of left plantar fascia Device: GE Logiq Q7 Ultrasound guided injection is preferred based studies that show increased duration, increased effect, greater accuracy, decreased procedural pain, increased response rate, and decreased cost with ultrasound guided versus blind injection.  Verbal informed consent obtained.  Time-out conducted.  Noted no overlying erythema, induration, or other signs of local infection.  Skin prepped in a sterile fashion.  Local anesthesia: Topical Ethyl chloride.  With sterile technique and under real time ultrasound guidance: With a 25-gauge half inch needle injected with 0.5 cc of 0.5% Marcaine and 0.5 cc of Kenalog 40 mg/mL Completed without difficulty   Advised to call if fevers/chills, erythema, induration, drainage, or persistent bleeding.  Impression: Technically successful ultrasound guided injection.    Assessment and Plan:  Plantar fasciitis of left foot Chronic problem with worsening symptoms.  Had responded previously to injection but now having worsening pain again.  Discussed with patient about icing regimen and home exercises, discussed which activities to do and which ones to avoid otherwise.  Increase activity slowly follow-up with me again in 6 to 8 weeks otherwise.  Lumbar radiculopathy Continued tightness  noted.  Discussed icing regimen and home exercises.  Discussed avoiding certain activities.  Increase activity slowly.  Follow-up with me again in 6 to 8 weeks.    Nonallopathic problems  Decision today to treat with OMT was based on  Physical Exam  After verbal consent patient was treated with HVLA, ME, FPR techniques in cervical, rib, thoracic, lumbar, and sacral  areas  Patient tolerated the procedure well with improvement in symptoms  Patient given exercises, stretches and lifestyle modifications  See medications in patient instructions if given  Patient will follow up in 4-8 weeks      The above documentation has been reviewed and is accurate and complete Judi Saa, DO        Note: This dictation was prepared with Dragon dictation along with smaller phrase technology. Any transcriptional errors that result from this process are unintentional.

## 2023-02-14 ENCOUNTER — Encounter: Payer: Self-pay | Admitting: Family Medicine

## 2023-02-14 ENCOUNTER — Other Ambulatory Visit: Payer: Self-pay

## 2023-02-16 ENCOUNTER — Encounter: Payer: Self-pay | Admitting: Family Medicine

## 2023-02-16 ENCOUNTER — Other Ambulatory Visit: Payer: Self-pay

## 2023-02-16 ENCOUNTER — Ambulatory Visit: Payer: 59 | Admitting: Family Medicine

## 2023-02-16 VITALS — BP 120/84 | HR 61 | Ht 67.0 in | Wt 157.0 lb

## 2023-02-16 DIAGNOSIS — M5416 Radiculopathy, lumbar region: Secondary | ICD-10-CM

## 2023-02-16 DIAGNOSIS — M9904 Segmental and somatic dysfunction of sacral region: Secondary | ICD-10-CM

## 2023-02-16 DIAGNOSIS — M79672 Pain in left foot: Secondary | ICD-10-CM | POA: Diagnosis not present

## 2023-02-16 DIAGNOSIS — M9901 Segmental and somatic dysfunction of cervical region: Secondary | ICD-10-CM

## 2023-02-16 DIAGNOSIS — M9903 Segmental and somatic dysfunction of lumbar region: Secondary | ICD-10-CM | POA: Diagnosis not present

## 2023-02-16 DIAGNOSIS — M9908 Segmental and somatic dysfunction of rib cage: Secondary | ICD-10-CM

## 2023-02-16 DIAGNOSIS — M722 Plantar fascial fibromatosis: Secondary | ICD-10-CM

## 2023-02-16 DIAGNOSIS — M9902 Segmental and somatic dysfunction of thoracic region: Secondary | ICD-10-CM

## 2023-02-16 NOTE — Assessment & Plan Note (Signed)
Chronic problem with worsening symptoms.  Had responded previously to injection but now having worsening pain again.  Discussed with patient about icing regimen and home exercises, discussed which activities to do and which ones to avoid otherwise.  Increase activity slowly follow-up with me again in 6 to 8 weeks otherwise.

## 2023-02-16 NOTE — Assessment & Plan Note (Signed)
Continued tightness noted.  Discussed icing regimen and home exercises.  Discussed avoiding certain activities.  Increase activity slowly.  Follow-up with me again in 6 to 8 weeks.

## 2023-02-16 NOTE — Patient Instructions (Addendum)
Injection in foot See you at next appointment

## 2023-02-26 ENCOUNTER — Encounter: Payer: Self-pay | Admitting: Family Medicine

## 2023-02-26 DIAGNOSIS — R030 Elevated blood-pressure reading, without diagnosis of hypertension: Secondary | ICD-10-CM

## 2023-02-26 NOTE — Progress Notes (Unsigned)
Established Patient Office Visit  Subjective   Patient ID: Kendra [name], female    DOB: [birth year]  Age: 52 y.o. MRN: [id number]  No chief complaint on file.   HPI  Discussed the use of AI scribe software for clinical note transcription with the patient, who gave verbal consent to proceed.  History of Present Illness           {History (Optional):23778}  ROS    Objective:     There were no vitals taken for this visit. {Vitals History (Optional):23777}  Physical Exam   No results found for any visits on 02/27/23.  {Labs (Optional):23779}  The 10-year ASCVD risk score (Arnett DK, et al., 2019) is: 1.3%    Assessment & Plan:   Problem List Items Addressed This Visit   None                No follow-ups on file.    Shirlee Latch, MD

## 2023-02-27 ENCOUNTER — Other Ambulatory Visit: Payer: Self-pay

## 2023-02-27 ENCOUNTER — Ambulatory Visit (INDEPENDENT_AMBULATORY_CARE_PROVIDER_SITE_OTHER): Payer: 59 | Admitting: Family Medicine

## 2023-02-27 ENCOUNTER — Encounter: Payer: Self-pay | Admitting: Family Medicine

## 2023-02-27 VITALS — BP 128/72 | HR 61 | Ht 67.0 in | Wt 154.6 lb

## 2023-02-27 DIAGNOSIS — R030 Elevated blood-pressure reading, without diagnosis of hypertension: Secondary | ICD-10-CM | POA: Diagnosis not present

## 2023-02-27 DIAGNOSIS — H8110 Benign paroxysmal vertigo, unspecified ear: Secondary | ICD-10-CM | POA: Diagnosis not present

## 2023-02-27 MED ORDER — MECLIZINE HCL 25 MG PO TABS
25.0000 mg | ORAL_TABLET | Freq: Three times a day (TID) | ORAL | 0 refills | Status: AC | PRN
  Filled 2023-02-27: qty 30, 10d supply, fill #0

## 2023-03-01 ENCOUNTER — Other Ambulatory Visit: Payer: Self-pay

## 2023-03-01 MED ORDER — BLOOD PRESSURE MONITOR MISC
0 refills | Status: AC
Start: 2023-03-01 — End: ?
  Filled 2023-03-01: qty 1, 30d supply, fill #0

## 2023-03-09 ENCOUNTER — Other Ambulatory Visit: Payer: Self-pay

## 2023-03-12 ENCOUNTER — Other Ambulatory Visit: Payer: Self-pay

## 2023-03-26 ENCOUNTER — Other Ambulatory Visit: Payer: Self-pay

## 2023-03-29 NOTE — Progress Notes (Signed)
  [name] [address] [place] [address] Phone: [phone] Subjective:   [name], am serving as a scribe for Dr. Antoine Primas.  I'm seeing this patient by the request  of:  Bacigalupo, Marzella Schlein, MD  CC: Back pain and foot pain follow-up  [id number]  VERLENA SIENER is a 52 y.o. female coming in with complaint of back and neck pain. OMT on 02/16/2023. Also seen for foot pain. Patient states same per usual. No new concerns.  Patient states that her foot is doing very well even with all the walking that patient has been doing.  Medications patient has been prescribed:   Taking:         Reviewed prior external information including notes and imaging from previsou exam, outside providers and external EMR if available.   As well as notes that were available from care everywhere and other healthcare systems.  Past medical history, social, surgical and family history all reviewed in electronic medical record.  No pertanent information unless stated regarding to the chief complaint.   Past Medical History:  Diagnosis Date   Allergy    Breast cancer (HCC) left   2004 with chemo tx   Osteoporosis    Personal history of chemotherapy     No Known Allergies   Review of Systems:  No headache, visual changes, nausea, vomiting, diarrhea, constipation, dizziness, abdominal pain, skin rash, fevers, chills, night sweats, weight loss, swollen lymph nodes, body aches, joint swelling, chest pain, shortness of breath, mood changes. POSITIVE muscle aches  Objective  Blood pressure 110/78, pulse 82, height 5\' 7"  (1.702 m), weight 155 lb (70.3 kg), SpO2 98%.   General: No apparent distress alert and oriented x3 mood and affect normal, dressed appropriately.  HEENT: Pupils equal, extraocular movements intact  Respiratory: Patient's speak in full sentences and does not appear short of breath  Cardiovascular: No lower extremity edema, non tender,  no erythema  Normal gait which is an improvement.  Tender to palpation in the paraspinal musculature.  Patient does have tightness around the sacroiliac joint right greater than left.  Some tightness noted in FABER test as well.  Osteopathic findings  C3 flexed rotated and side bent right C7 flexed rotated and side bent left T3 extended rotated and side bent right inhaled rib T9 extended rotated and side bent left L2 flexed rotated and side bent right L3 flexed rotated and side bent left Sacrum right on right       Assessment and Plan:  No problem-specific Assessment & Plan notes found for this encounter.    Nonallopathic problems  Decision today to treat with OMT was based on Physical Exam  After verbal consent patient was treated with HVLA, ME, FPR techniques in cervical, rib, thoracic, lumbar, and sacral  areas  Patient tolerated the procedure well with improvement in symptoms  Patient given exercises, stretches and lifestyle modifications  See medications in patient instructions if given  Patient will follow up in 4-8 weeks    The above documentation has been reviewed and is accurate and complete Judi Saa, DO          Note: This dictation was prepared with Dragon dictation along with smaller phrase technology. Any transcriptional errors that result from this process are unintentional.

## 2023-03-30 ENCOUNTER — Ambulatory Visit: Payer: 59 | Admitting: Family Medicine

## 2023-03-30 ENCOUNTER — Encounter: Payer: Self-pay | Admitting: Family Medicine

## 2023-03-30 VITALS — BP 110/78 | HR 82 | Ht 67.0 in | Wt 155.0 lb

## 2023-03-30 DIAGNOSIS — M9902 Segmental and somatic dysfunction of thoracic region: Secondary | ICD-10-CM | POA: Diagnosis not present

## 2023-03-30 DIAGNOSIS — M9908 Segmental and somatic dysfunction of rib cage: Secondary | ICD-10-CM | POA: Diagnosis not present

## 2023-03-30 DIAGNOSIS — M533 Sacrococcygeal disorders, not elsewhere classified: Secondary | ICD-10-CM | POA: Diagnosis not present

## 2023-03-30 DIAGNOSIS — M9903 Segmental and somatic dysfunction of lumbar region: Secondary | ICD-10-CM

## 2023-03-30 DIAGNOSIS — M9904 Segmental and somatic dysfunction of sacral region: Secondary | ICD-10-CM

## 2023-03-30 DIAGNOSIS — M9901 Segmental and somatic dysfunction of cervical region: Secondary | ICD-10-CM

## 2023-03-30 NOTE — Patient Instructions (Signed)
Good to see you! See you at your next appointment

## 2023-03-30 NOTE — Assessment & Plan Note (Signed)
Chronic problem with mild exacerbation.  Still took the same medications including the muscle relaxer at night.  Discussed posture and ergonomics.  Doing relatively well overall with everything else.  Follow-up with me again in 6 to 8 weeks

## 2023-05-23 NOTE — Progress Notes (Signed)
  Kendra [name] [address] [place] [address] Phone: [phone] Subjective:   Kendra [name], am serving as a scribe for Dr. Antoine Primas.  I'm seeing this patient by the request  of:  Bacigalupo, Marzella Schlein, MD  CC: Back and neck pain follow-up  [id number]  Kendra [name] is a 52 y.o. female coming in with complaint of back and neck pain. OMT on 03/30/2023. Patient states same per usual. Foot pain has come back within the last month. Going to try dry needling.  Medications patient has been prescribed:   Taking:         Reviewed prior external information including notes and imaging from previsou exam, outside providers and external EMR if available.   As well as notes that were available from care everywhere and other healthcare systems.  Past medical history, social, surgical and family history all reviewed in electronic medical record.  No pertanent information unless stated regarding to the chief complaint.   Past Medical History:  Diagnosis Date   Allergy    Breast cancer (HCC) left   2004 with chemo tx   Osteoporosis    Personal history of chemotherapy     No Known Allergies   Review of Systems:  No headache, visual changes, nausea, vomiting, diarrhea, constipation, dizziness, abdominal pain, skin rash, fevers, chills, night sweats, weight loss, swollen lymph nodes, body aches, joint swelling, chest pain, shortness of breath, mood changes. POSITIVE muscle aches  Objective  Blood pressure 124/80, pulse 89, height 5\' 7"  (1.702 m), weight 151 lb (68.5 kg), SpO2 97%.   General: No apparent distress alert and oriented x3 mood and affect normal, dressed appropriately.  HEENT: Pupils equal, extraocular movements intact  Respiratory: Patient's speak in full sentences and does not appear short of breath  Cardiovascular: No lower extremity edema, non tender, no erythema  MSK:  Back does have some loss lordosis noted.  Some  tenderness to palpation in the paraspinal musculature.  Tightness noted on the hip at the adductor group on the right.  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  Discussed icing regimen and home exercises, discussed which activities to do and which ones to avoid.  Increase activity slowly.  Follow-up again in 6 to 8 weeks.    Nonallopathic problems  Decision today to treat with OMT was based on Physical Exam  After verbal consent patient was treated with HVLA, ME, FPR techniques in cervical, rib, thoracic, lumbar, and sacral  areas  Patient tolerated the procedure well with improvement in symptoms  Patient given exercises, stretches and lifestyle modifications  See medications in patient instructions if given  Patient will follow up in 4-8 weeks      The above documentation has been reviewed and is accurate and complete Judi Saa, DO        Note: This dictation was prepared with Dragon dictation along with smaller phrase technology. Any transcriptional errors that result from this process are unintentional.

## 2023-05-25 ENCOUNTER — Ambulatory Visit: Payer: 59 | Admitting: Family Medicine

## 2023-05-25 ENCOUNTER — Encounter: Payer: Self-pay | Admitting: Family Medicine

## 2023-05-25 VITALS — BP 124/80 | HR 89 | Ht 67.0 in | Wt 151.0 lb

## 2023-05-25 DIAGNOSIS — M533 Sacrococcygeal disorders, not elsewhere classified: Secondary | ICD-10-CM | POA: Diagnosis not present

## 2023-05-25 DIAGNOSIS — M9902 Segmental and somatic dysfunction of thoracic region: Secondary | ICD-10-CM | POA: Diagnosis not present

## 2023-05-25 DIAGNOSIS — M9903 Segmental and somatic dysfunction of lumbar region: Secondary | ICD-10-CM | POA: Diagnosis not present

## 2023-05-25 DIAGNOSIS — M9904 Segmental and somatic dysfunction of sacral region: Secondary | ICD-10-CM | POA: Diagnosis not present

## 2023-05-25 DIAGNOSIS — M9908 Segmental and somatic dysfunction of rib cage: Secondary | ICD-10-CM

## 2023-05-25 DIAGNOSIS — M9901 Segmental and somatic dysfunction of cervical region: Secondary | ICD-10-CM | POA: Diagnosis not present

## 2023-05-25 NOTE — Assessment & Plan Note (Signed)
Chronic problem with exacerbation.  Discussed icing regimen and home exercises, discussed which activities to do and which ones to avoid.  Increase activity slowly.  Follow-up again in 6 to 8 weeks

## 2023-06-01 ENCOUNTER — Ambulatory Visit: Payer: 59 | Admitting: Sports Medicine

## 2023-06-18 DIAGNOSIS — D2262 Melanocytic nevi of left upper limb, including shoulder: Secondary | ICD-10-CM | POA: Diagnosis not present

## 2023-06-18 DIAGNOSIS — D2261 Melanocytic nevi of right upper limb, including shoulder: Secondary | ICD-10-CM | POA: Diagnosis not present

## 2023-06-18 DIAGNOSIS — D2271 Melanocytic nevi of right lower limb, including hip: Secondary | ICD-10-CM | POA: Diagnosis not present

## 2023-06-18 DIAGNOSIS — D2272 Melanocytic nevi of left lower limb, including hip: Secondary | ICD-10-CM | POA: Diagnosis not present

## 2023-06-18 DIAGNOSIS — L821 Other seborrheic keratosis: Secondary | ICD-10-CM | POA: Diagnosis not present

## 2023-06-18 DIAGNOSIS — L817 Pigmented purpuric dermatosis: Secondary | ICD-10-CM | POA: Diagnosis not present

## 2023-06-18 DIAGNOSIS — D225 Melanocytic nevi of trunk: Secondary | ICD-10-CM | POA: Diagnosis not present

## 2023-07-04 NOTE — Progress Notes (Unsigned)
[name] [address] [place] [address] Phone: [phone] Subjective:   Kendra [name], am serving as a scribe for Dr. Antoine Primas.  I'm seeing this patient by the request  of:  [name], Kendra Schlein, MD  CC: Back and neck pain follow-up  [id number]  Kendra [name] is a 53 y.o. female coming in with complaint of back and neck pain. OMT on 05/25/2023. Patient states that she is very tight today in lumbar spine. Also having some pain in thoracic spine from taking care of her mom.   Medications patient has been prescribed:   Taking:         Reviewed prior external information including notes and imaging from previsou exam, outside providers and external EMR if available.   As well as notes that were available from care everywhere and other healthcare systems.  Past medical history, social, surgical and family history all reviewed in electronic medical record.  No pertanent information unless stated regarding to the chief complaint.   Past Medical History:  Diagnosis Date   Allergy    Breast cancer (HCC) left   2004 with chemo tx   Osteoporosis    Personal history of chemotherapy     No Known Allergies   Review of Systems:  No headache, visual changes, nausea, vomiting, diarrhea, constipation, dizziness, abdominal pain, skin rash, fevers, chills, night sweats, weight loss, swollen lymph nodes, body aches, joint swelling, chest pain, shortness of breath, mood changes. POSITIVE muscle aches  Objective  Blood pressure 118/82, pulse 75, height 5\' 7"  (1.702 m), weight 150 lb (68 kg), SpO2 100%.   General: No apparent distress alert and oriented x3 mood and affect normal, dressed appropriately.  Much more stress today HEENT: Pupils equal, extraocular movements intact  Respiratory: Patient's speak in full sentences and does not appear short of breath  Cardiovascular: No lower extremity edema, non tender, no erythema  Low back does  have significant tenderness more in the gluteal area and the sacroiliac joint.  Tightness noted with FABER.  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  Patient does have difficulty with this.  Discussed with patient about monitoring closely.  Patient is having difficulty being active secondary to all the other difficulties that she is having in life.  We did discuss to her that we do have follow-up other treatment options and we are always here for her if necessary.  Does respond well though to osteopathic manipulation for some of the pain resolution.  I would like to see patient again in 6 to 8 weeks for further evaluation and treatment    Nonallopathic problems  Decision today to treat with OMT was based on Physical Exam  After verbal consent patient was treated with HVLA, ME, FPR techniques in cervical, rib, thoracic, lumbar, and sacral  areas  Patient tolerated the procedure well with improvement in symptoms  Patient given exercises, stretches and lifestyle modifications  See medications in patient instructions if given  Patient will  follow up in 4-8 weeks    The above documentation has been reviewed and is accurate and complete Judi Saa, DO          Note: This dictation was prepared with Dragon dictation along with smaller phrase technology. Any transcriptional errors that result from this process are unintentional.

## 2023-07-06 ENCOUNTER — Ambulatory Visit (INDEPENDENT_AMBULATORY_CARE_PROVIDER_SITE_OTHER): Payer: 59 | Admitting: Family Medicine

## 2023-07-06 VITALS — BP 118/82 | HR 75 | Ht 67.0 in | Wt 150.0 lb

## 2023-07-06 DIAGNOSIS — M533 Sacrococcygeal disorders, not elsewhere classified: Secondary | ICD-10-CM | POA: Diagnosis not present

## 2023-07-06 DIAGNOSIS — M9902 Segmental and somatic dysfunction of thoracic region: Secondary | ICD-10-CM

## 2023-07-06 DIAGNOSIS — M9903 Segmental and somatic dysfunction of lumbar region: Secondary | ICD-10-CM

## 2023-07-06 DIAGNOSIS — M9901 Segmental and somatic dysfunction of cervical region: Secondary | ICD-10-CM | POA: Diagnosis not present

## 2023-07-06 DIAGNOSIS — M9904 Segmental and somatic dysfunction of sacral region: Secondary | ICD-10-CM | POA: Diagnosis not present

## 2023-07-06 DIAGNOSIS — M9908 Segmental and somatic dysfunction of rib cage: Secondary | ICD-10-CM

## 2023-07-07 ENCOUNTER — Encounter: Payer: Self-pay | Admitting: Family Medicine

## 2023-07-07 NOTE — Assessment & Plan Note (Signed)
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  Patient does have difficulty with this.  Discussed with patient about monitoring closely.  Patient is having difficulty being active secondary to all the other difficulties that she is having in life.  We did discuss to her that we do have follow-up other treatment options and we are always here for her if necessary.  Does respond well though to osteopathic manipulation for some of the pain resolution.  I would like to see patient again in 6 to 8 weeks for further evaluation and treatment

## 2023-07-20 ENCOUNTER — Encounter: Payer: Self-pay | Admitting: Family Medicine

## 2023-08-03 ENCOUNTER — Telehealth: Payer: 59 | Admitting: Family Medicine

## 2023-08-03 ENCOUNTER — Other Ambulatory Visit: Payer: Self-pay

## 2023-08-03 DIAGNOSIS — J019 Acute sinusitis, unspecified: Secondary | ICD-10-CM

## 2023-08-03 DIAGNOSIS — H6991 Unspecified Eustachian tube disorder, right ear: Secondary | ICD-10-CM

## 2023-08-03 DIAGNOSIS — B9689 Other specified bacterial agents as the cause of diseases classified elsewhere: Secondary | ICD-10-CM | POA: Diagnosis not present

## 2023-08-03 MED ORDER — AMOXICILLIN-POT CLAVULANATE 875-125 MG PO TABS
1.0000 | ORAL_TABLET | Freq: Two times a day (BID) | ORAL | 0 refills | Status: DC
  Filled 2023-08-03: qty 20, 10d supply, fill #0

## 2023-08-03 MED ORDER — PREDNISONE 20 MG PO TABS
20.0000 mg | ORAL_TABLET | Freq: Two times a day (BID) | ORAL | 0 refills | Status: AC
  Filled 2023-08-03: qty 10, 5d supply, fill #0
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
## 2023-08-03 NOTE — Patient Instructions (Signed)

## 2023-08-17 ENCOUNTER — Ambulatory Visit: Payer: 59 | Admitting: Family Medicine

## 2023-10-05 ENCOUNTER — Ambulatory Visit (INDEPENDENT_AMBULATORY_CARE_PROVIDER_SITE_OTHER): Payer: Self-pay | Admitting: Family Medicine

## 2023-10-05 ENCOUNTER — Encounter: Payer: Self-pay | Admitting: Family Medicine

## 2023-10-05 ENCOUNTER — Other Ambulatory Visit: Payer: Self-pay

## 2023-10-05 VITALS — BP 131/79 | HR 63 | Ht 67.0 in | Wt 150.0 lb

## 2023-10-05 DIAGNOSIS — J302 Other seasonal allergic rhinitis: Secondary | ICD-10-CM | POA: Diagnosis not present

## 2023-10-05 DIAGNOSIS — M818 Other osteoporosis without current pathological fracture: Secondary | ICD-10-CM | POA: Diagnosis not present

## 2023-10-05 DIAGNOSIS — Z800 Family history of malignant neoplasm of digestive organs: Secondary | ICD-10-CM | POA: Diagnosis not present

## 2023-10-05 DIAGNOSIS — Z0000 Encounter for general adult medical examination without abnormal findings: Secondary | ICD-10-CM

## 2023-10-05 DIAGNOSIS — Z1231 Encounter for screening mammogram for malignant neoplasm of breast: Secondary | ICD-10-CM

## 2023-10-05 DIAGNOSIS — Z853 Personal history of malignant neoplasm of breast: Secondary | ICD-10-CM

## 2023-10-05 DIAGNOSIS — E782 Mixed hyperlipidemia: Secondary | ICD-10-CM | POA: Diagnosis not present

## 2023-10-05 MED ORDER — CLARITIN-D 24 HOUR 10-240 MG PO TB24
1.0000 | ORAL_TABLET | Freq: Every day | ORAL | 1 refills | Status: AC
Start: 2023-10-05 — End: ?
  Filled 2023-10-05: qty 30, 30d supply, fill #0
  Filled 2023-12-07: qty 30, 30d supply, fill #1
  Filled 2024-01-04: qty 30, 30d supply, fill #2
  Filled 2024-03-20: qty 30, 30d supply, fill #3

## 2023-10-05 MED ORDER — ALENDRONATE SODIUM 70 MG PO TABS
70.0000 mg | ORAL_TABLET | ORAL | 3 refills | Status: AC
  Filled 2023-10-05: qty 12, 84d supply, fill #0
  Filled 2024-01-04: qty 12, 84d supply, fill #1
  Filled 2024-04-05: qty 12, 84d supply, fill #2
  Filled 2024-06-25: qty 12, 84d supply, fill #3

## 2023-10-05 NOTE — Progress Notes (Signed)
 Complete physical exam   [name]   DOB: 01-Apr-1971   53 y.o. Female  MRN: [id number] Visit Date: 10/05/2023  Today's healthcare provider: Aden Agreste, MD   Chief Complaint  Patient presents with   Annual Exam    No concerns    Subjective    Kendra [name] is a 53 y.o. female who presents today for a complete physical exam.    Discussed the use of AI scribe software for clinical note transcription with the patient, who gave verbal consent to proceed.  History of Present Illness   A 53 year old female with a history of osteoporosis and breast cancer, currently managed with Fosamax , presents for her annual physical. She reports that her mother recently passed away from aggressive colon cancer, which has prompted her to consider genetic testing. She has a family history of various health issues including breast cancer, colon cancer, diabetes, and thyroid  issues. She has been dealing with high blood pressure, which she attributes to her work at [employer]. She also mentions a history of ear infection and sinus issues. She is considering genetic testing due to her family history.        Last depression screening scores    10/05/2023    8:19 AM 02/27/2023    9:07 AM 09/12/2022    8:30 AM  PHQ 2/9 Scores  PHQ - 2 Score 2 0 0  PHQ- 9 Score 3 0 0   Last fall risk screening    09/12/2022    8:30 AM  Fall Risk   Falls in the past year? 0  Number falls in past yr: 0  Injury with Fall? 0  Risk for fall due to : No Fall Risks  Follow up Falls evaluation completed        Medications: Outpatient Medications Prior to Visit  Medication Sig   Blood Pressure Monitor MISC use to check blood pressure daily   ipratropium (ATROVENT ) 0.03 % nasal spray Place 2 sprays into both nostrils every 12 (twelve) hours.   meclizine  (ANTIVERT ) 25 MG tablet Take 1 tablet (25 mg total) by mouth 3 (three) times daily as needed for dizziness.   triamcinolone  cream (KENALOG ) 0.1 % Apply  twice daily to rash up to 2 weeks/month as needed.   valACYclovir  (VALTREX ) 1000 MG tablet Take 2 tablets (2,000 mg total) by mouth 2 (two) times daily. At onset of cold sore   [DISCONTINUED] alendronate  (FOSAMAX ) 70 MG tablet Take 1 tablet (70 mg total) by mouth every 7 (seven) days. Take with a full glass of water on an empty stomach.   [DISCONTINUED] amoxicillin -clavulanate (AUGMENTIN ) 875-125 MG tablet Take 1 tablet by mouth 2 (two) times daily.   [DISCONTINUED] loratadine -pseudoephedrine  (CLARITIN -D 24 HOUR) 10-240 MG 24 hr tablet Take 1 tablet by mouth daily.   [DISCONTINUED] predniSONE  (DELTASONE ) 20 MG tablet Take 1 tablet (20 mg total) by mouth daily with breakfast.   No facility-administered medications prior to visit.    Review of Systems    Objective    BP 131/79   Pulse 63   Ht 5\' 7"  (1.702 m)   Wt 150 lb (68 kg)   SpO2 100%   BMI 23.49 kg/m    Physical Exam Vitals reviewed.  Constitutional:      General: She is not in acute distress.    Appearance: Normal appearance. She is well-developed. She is not diaphoretic.  HENT:     Head: Normocephalic and atraumatic.     Right  Ear: Tympanic membrane, ear canal and external ear normal.     Left Ear: Tympanic membrane, ear canal and external ear normal.     Nose: Nose normal.     Mouth/Throat:     Mouth: Mucous membranes are moist.     Pharynx: Oropharynx is clear. No oropharyngeal exudate.  Eyes:     General: No scleral icterus.    Conjunctiva/sclera: Conjunctivae normal.     Pupils: Pupils are equal, round, and reactive to light.  Neck:     Thyroid : No thyromegaly.  Cardiovascular:     Rate and Rhythm: Normal rate and regular rhythm.     Heart sounds: Normal heart sounds. No murmur heard. Pulmonary:     Effort: Pulmonary effort is normal. No respiratory distress.     Breath sounds: Normal breath sounds. No wheezing or rales.  Abdominal:     General: There is no distension.     Palpations: Abdomen is soft.      Tenderness: There is no abdominal tenderness.  Musculoskeletal:        General: No deformity.     Cervical back: Neck supple.     Right lower leg: No edema.     Left lower leg: No edema.  Lymphadenopathy:     Cervical: No cervical adenopathy.  Skin:    General: Skin is warm and dry.     Findings: No rash.  Neurological:     Mental Status: She is alert and oriented to person, place, and time. Mental status is at baseline.     Gait: Gait normal.  Psychiatric:        Mood and Affect: Mood normal.        Behavior: Behavior normal.        Thought Content: Thought content normal.      No results found for any visits on 10/05/23.  Assessment & Plan    Routine Health Maintenance and Physical Exam  Exercise Activities and Dietary recommendations  Goals      Exercise 150 minutes per week (moderate activity)        Immunization History  Administered Date(s) Administered   Influenza,inj,Quad PF,6+ Mos 03/13/2015   Influenza-Unspecified 02/19/2017, 04/07/2018, 04/06/2021   PFIZER(Purple Top)SARS-COV-2 Vaccination 07/03/2019, 07/22/2019    Health Maintenance  Topic Date Due   Zoster Vaccines- Shingrix (1 of 2) Never done   COVID-19 Vaccine (3 - Pfizer risk series) 08/19/2019   DTaP/Tdap/Td (1 - Tdap) 10/04/2024 (Originally 05/11/1989)   INFLUENZA VACCINE  01/11/2024   MAMMOGRAM  12/06/2024   Colonoscopy  02/01/2026   Hepatitis C Screening  Completed   HIV Screening  Completed   HPV VACCINES  Aged Out   Meningococcal B Vaccine  Aged Out    Discussed health benefits of physical activity, and encouraged her to engage in regular exercise appropriate for her age and condition.  Problem List Items Addressed This Visit       Musculoskeletal and Integument   OP (osteoporosis)   Relevant Medications   alendronate  (FOSAMAX ) 70 MG tablet   Other Relevant Orders   TSH   VITAMIN D  25 Hydroxy (Vit-D Deficiency, Fractures)     Other   History of breast cancer   Relevant  Orders   TSH   CBC with Differential/Platelet   Cancer antigen 27.29   Ambulatory referral to Genetics   Other Visit Diagnoses       Encounter for annual physical exam    -  Primary   Relevant Orders  TSH   CBC with Differential/Platelet   Comprehensive metabolic panel with GFR   Lipid panel   VITAMIN D  25 Hydroxy (Vit-D Deficiency, Fractures)   Cancer antigen 27.29     Breast cancer screening by mammogram       Relevant Orders   MM 3D SCREENING MAMMOGRAM BILATERAL BREAST     Moderate mixed hyperlipidemia not requiring statin therapy       Relevant Orders   Comprehensive metabolic panel with GFR   Lipid panel     Family history of colon cancer       Relevant Orders   Ambulatory referral to Genetics     Seasonal allergies       Relevant Medications   loratadine -pseudoephedrine  (CLARITIN -D 24 HOUR) 10-240 MG 24 hr tablet           Wellness Visit Annual physical examination conducted. Discussed family history of colon cancer and the importance of genetic testing due to aggressive nature of mother's colon cancer. Previous BRCA testing was negative. Discussed the potential hereditary nature of colon cancer and the importance of genetic testing for her and her children. Discussed her grief counseling and its benefits. - Order mammogram for June - Order CA 27-29 blood test - Order usual labs including cholesterol, kidney and liver function, blood counts, thyroid , and vitamins - Refer to geneticist at the cancer center for further genetic testing - Continue grief counseling  Breast Cancer Breast cancer with negative BRCA testing. Emphasized the importance of continued monitoring and genetic testing due to family history of breast and colon cancer. - Order mammogram for June - Order CA 27-29 blood test - Refer to geneticist at the cancer center for further genetic testing  Osteoporosis Osteoporosis managed with Fosamax . Bone density was last checked last year and is  monitored every 2 years. - Refill Fosamax  prescription  Allergies Allergies causing significant symptoms. Discussed the use of Claritin  D for symptom management. - Prescribe Claritin  D       Return in about 1 year (around 10/04/2024) for CPE.     Aden Agreste, MD  [facility] [phone] (phone) [phone] (fax)  [facility]

## 2023-10-06 LAB — CBC WITH DIFFERENTIAL/PLATELET
Basophils Absolute: 0 10*3/uL (ref 0.0–0.2)
Basos: 1 %
EOS (ABSOLUTE): 0.1 10*3/uL (ref 0.0–0.4)
Eos: 2 %
Hematocrit: 38.3 % (ref 34.0–46.6)
Hemoglobin: 12.4 g/dL (ref 11.1–15.9)
Immature Grans (Abs): 0 10*3/uL (ref 0.0–0.1)
Immature Granulocytes: 0 %
Lymphocytes Absolute: 1 10*3/uL (ref 0.7–3.1)
Lymphs: 34 %
MCH: 30.6 pg (ref 26.6–33.0)
MCHC: 32.4 g/dL (ref 31.5–35.7)
MCV: 95 fL (ref 79–97)
Monocytes Absolute: 0.3 10*3/uL (ref 0.1–0.9)
Monocytes: 10 %
Neutrophils Absolute: 1.6 10*3/uL (ref 1.4–7.0)
Neutrophils: 53 %
Platelets: 157 10*3/uL (ref 150–450)
RBC: 4.05 x10E6/uL (ref 3.77–5.28)
RDW: 11.9 % (ref 11.7–15.4)
WBC: 3.1 10*3/uL — ABNORMAL LOW (ref 3.4–10.8)

## 2023-10-06 LAB — COMPREHENSIVE METABOLIC PANEL WITH GFR
ALT: 11 IU/L (ref 0–32)
AST: 16 IU/L (ref 0–40)
Albumin: 4.2 g/dL (ref 3.8–4.9)
Alkaline Phosphatase: 43 IU/L — ABNORMAL LOW (ref 44–121)
BUN/Creatinine Ratio: 13 (ref 9–23)
BUN: 10 mg/dL (ref 6–24)
Bilirubin Total: 0.6 mg/dL (ref 0.0–1.2)
CO2: 25 mmol/L (ref 20–29)
Calcium: 9.4 mg/dL (ref 8.7–10.2)
Chloride: 102 mmol/L (ref 96–106)
Creatinine, Ser: 0.76 mg/dL (ref 0.57–1.00)
Globulin, Total: 2 g/dL (ref 1.5–4.5)
Glucose: 88 mg/dL (ref 70–99)
Potassium: 4.4 mmol/L (ref 3.5–5.2)
Sodium: 139 mmol/L (ref 134–144)
Total Protein: 6.2 g/dL (ref 6.0–8.5)
eGFR: 94 mL/min/{1.73_m2} (ref >59)

## 2023-10-06 LAB — LIPID PANEL
Chol/HDL Ratio: 3.1 ratio (ref 0.0–4.4)
Cholesterol, Total: 174 mg/dL (ref 100–199)
HDL: 57 mg/dL (ref >39)
LDL Chol Calc (NIH): 102 mg/dL — ABNORMAL HIGH (ref 0–99)
Triglycerides: 80 mg/dL (ref 0–149)
VLDL Cholesterol Cal: 15 mg/dL (ref 5–40)

## 2023-10-06 LAB — TSH: TSH: 1.5 u[IU]/mL (ref 0.450–4.500)

## 2023-10-06 LAB — CANCER ANTIGEN 27.29: CA 27.29: <9 U/mL (ref 0.0–38.6)

## 2023-10-06 LAB — VITAMIN D 25 HYDROXY (VIT D DEFICIENCY, FRACTURES): Vit D, 25-Hydroxy: 61.7 ng/mL (ref 30.0–100.0)

## 2023-10-10 ENCOUNTER — Encounter: Payer: Self-pay | Admitting: Family Medicine

## 2023-10-10 DIAGNOSIS — D729 Disorder of white blood cells, unspecified: Secondary | ICD-10-CM

## 2023-11-16 DIAGNOSIS — D1030 Benign neoplasm of unspecified part of mouth: Secondary | ICD-10-CM | POA: Diagnosis not present

## 2023-11-16 DIAGNOSIS — K1321 Leukoplakia of oral mucosa, including tongue: Secondary | ICD-10-CM | POA: Diagnosis not present

## 2023-11-16 DIAGNOSIS — J301 Allergic rhinitis due to pollen: Secondary | ICD-10-CM | POA: Diagnosis not present

## 2023-11-20 ENCOUNTER — Inpatient Hospital Stay

## 2023-11-20 ENCOUNTER — Inpatient Hospital Stay: Admitting: Licensed Clinical Social Worker

## 2023-12-07 ENCOUNTER — Other Ambulatory Visit: Payer: Self-pay

## 2024-01-04 ENCOUNTER — Other Ambulatory Visit: Payer: Self-pay

## 2024-01-25 ENCOUNTER — Ambulatory Visit
Admission: RE | Admit: 2024-01-25 | Discharge: 2024-01-25 | Disposition: A | Discharge status: Home / Self Care | Source: Ambulatory Visit | Attending: Family Medicine | Admitting: Family Medicine

## 2024-01-25 DIAGNOSIS — Z1231 Encounter for screening mammogram for malignant neoplasm of breast: Secondary | ICD-10-CM | POA: Insufficient documentation

## 2024-01-29 ENCOUNTER — Ambulatory Visit: Payer: Self-pay | Admitting: Family Medicine

## 2024-02-05 ENCOUNTER — Encounter: Payer: Self-pay | Admitting: Licensed Clinical Social Worker

## 2024-02-05 ENCOUNTER — Other Ambulatory Visit

## 2024-02-05 ENCOUNTER — Inpatient Hospital Stay: Attending: Oncology | Admitting: Licensed Clinical Social Worker

## 2024-02-05 ENCOUNTER — Other Ambulatory Visit: Payer: Self-pay | Admitting: Licensed Clinical Social Worker

## 2024-02-05 DIAGNOSIS — Z803 Family history of malignant neoplasm of breast: Secondary | ICD-10-CM | POA: Diagnosis not present

## 2024-02-05 DIAGNOSIS — Z8042 Family history of malignant neoplasm of prostate: Secondary | ICD-10-CM | POA: Diagnosis not present

## 2024-02-05 DIAGNOSIS — Z853 Personal history of malignant neoplasm of breast: Secondary | ICD-10-CM | POA: Diagnosis not present

## 2024-02-05 DIAGNOSIS — Z800 Family history of malignant neoplasm of digestive organs: Secondary | ICD-10-CM | POA: Diagnosis not present

## 2024-02-05 DIAGNOSIS — Z8041 Family history of malignant neoplasm of ovary: Secondary | ICD-10-CM | POA: Diagnosis not present

## 2024-02-05 DIAGNOSIS — Z801 Family history of malignant neoplasm of trachea, bronchus and lung: Secondary | ICD-10-CM | POA: Diagnosis not present

## 2024-02-05 DIAGNOSIS — Z808 Family history of malignant neoplasm of other organs or systems: Secondary | ICD-10-CM

## 2024-02-05 NOTE — Progress Notes (Signed)
 REFERRING PROVIDER: Myrla Jon HERO, MD [address] [place],  [place] [address]  PRIMARY PROVIDER:  Myrla Jon HERO, MD  PRIMARY REASON FOR VISIT:  1. Personal history of breast cancer   2. Family history of breast cancer   3. Family history of colon cancer   4. Family history of brain cancer   5. Family history of lung cancer   6. Family history of thyroid  cancer   7. Family history of prostate cancer   8. Family history of ovarian cancer      HISTORY OF PRESENT ILLNESS:   Kendra [name], a 53 y.o. female, was seen for a [facility] cancer genetics consultation at the request of Dr. Myrla due to a personal and family history of cancer.  Kendra [name] presents to clinic today to discuss the possibility of a hereditary predisposition to cancer, genetic testing, and to further clarify her future cancer risks, as well as potential cancer risks for family members.   CANCER HISTORY:  In 2007, at the age of 24, Kendra [name] was diagnosed with left breast cancer. This was treated with mastectomy and chemotherapy. She also had negative BRCA1/BRCA2 testing at the time.    RELEVANT MEDICAL HISTORY:  Menarche was at age 70.  First live birth at age 69.  Ovaries intact: no.  Hysterectomy: yes.  Menopausal status: postmenopausal.  Colonoscopy: yes; 2024.  Past Medical History:  Diagnosis Date   Allergy    Breast cancer (HCC) left   2004 with chemo tx   Osteoporosis    Personal history of chemotherapy     Past Surgical History:  Procedure Laterality Date   BREAST SURGERY     implant left in 2004, and biopsy   COLONOSCOPY WITH PROPOFOL  N/A 12/31/2020   Procedure: COLONOSCOPY WITH PROPOFOL ;  Surgeon: Janalyn Keene NOVAK, MD;  Location: [facility] ENDOSCOPY;  Service: Gastroenterology;  Laterality: N/A;   COLONOSCOPY WITH PROPOFOL  N/A 02/02/2023   Procedure: COLONOSCOPY WITH PROPOFOL ;  Surgeon: Therisa Bi, MD;  Location: [facility] ENDOSCOPY;  Service: Gastroenterology;  Laterality:  N/A;   MASTECTOMY Left 01/07/03   with tram flap and right breast lift in 2008   TOTAL ABDOMINAL HYSTERECTOMY     related to breast cancer   TOTAL ABDOMINAL HYSTERECTOMY W/ BILATERAL SALPINGOOPHORECTOMY  10/2003    FAMILY HISTORY:  We obtained a detailed, 4-generation family history.  Significant diagnoses are listed below: Family History  Problem Relation Age of Onset   Arthritis Mother    Hypothyroidism Mother    Breast cancer Mother 76   Colon cancer Mother 25   Lung cancer Father 12   Alcohol abuse Brother    Breast cancer Maternal Aunt 41   Brain cancer Paternal Aunt    Brain cancer Paternal Aunt    Lung cancer Paternal Aunt    Cancer Paternal Cousin        1st cousins w/ thyroid , prostate, 2nd cousin with ovarian    Kendra [name] is unaware of previous family history of genetic testing for hereditary cancer risks. There is no reported Ashkenazi Jewish ancestry. There is no known consanguinity.  GENETIC COUNSELING ASSESSMENT: Kendra [name] is a 53 y.o. female with a personal and family history of cancer which is somewhat suggestive of a hereditary cancer syndrome and predisposition to cancer. We, therefore, discussed and recommended the following at today's visit.   DISCUSSION: We discussed that approximately 10% of breast cancer is hereditary. Most cases of hereditary breast cancer are associated with BRCA1/BRCA2 genes, although  there are other genes associated with hereditary  cancer as well. We also discussed Lynch syndrome given the history of colon cancer in her mother. Kendra [name] reported that her mother's colon cancer had loss of MLH1 but no follow up test was able to be done. Cancers and risks are gene specific . We discussed that testing is beneficial for several reasons including knowing about cancer risks, identifying potential screening and risk-reduction options that may be appropriate, and to understand if other family members could be at risk for cancer and allow them to  undergo genetic testing.   We reviewed the characteristics, features and inheritance patterns of hereditary cancer syndromes. We also discussed genetic testing, including the appropriate family members to test, the process of testing, insurance coverage and turn-around-time for results. We discussed the implications of a negative, positive and/or variant of uncertain significant result. We recommended Kendra [name] pursue genetic testing for the [facility] CancerNext-Expanded+RNA gene panel.   The CancerNext-Expanded gene panel offered by [facility] and includes sequencing, rearrangement, and RNA analysis for the following 77 genes: AIP, ALK, APC, ATM, AXIN2, BAP1, BARD1, BMPR1A, BRCA1, BRCA2, BRIP1, CDC73, CDH1, CDK4, CDKN1B, CDKN2A, CEBPA, CHEK2, CTNNA1, DDX41, DICER1, ETV6, FH, FLCN, GATA2, LZTR1, MAX, MBD4, MEN1, MET, MLH1, MSH2, MSH3, MSH6, MUTYH, NF1, NF2, NTHL1, PALB2, PHOX2B, PMS2, POT1, PRKAR1A, PTCH1, PTEN, RAD51C, RAD51D, RB1, RET, RPS20, RUNX1, SDHA, SDHAF2, SDHB, SDHC, SDHD, SMAD4, SMARCA4, SMARCB1, SMARCE1, STK11, SUFU, TMEM127, TP53, TSC1, TSC2, VHL, and WT1 (sequencing and deletion/duplication); EGFR, HOXB13, KIT, MITF, PDGFRA, POLD1, and POLE (sequencing only); EPCAM and GREM1 (deletion/duplication only).   Based on Kendra [name]'s personal and family history of cancer, she meets medical criteria for genetic testing. Despite that she meets criteria, she may still have an out of pocket cost.   PLAN: After considering the risks, benefits, and limitations, Kendra [name] provided informed consent to pursue genetic testing and the blood sample was sent to [facility] for analysis of the CancerNext-Expanded+RNA Panel. Results should be available within approximately 2-3 weeks' time, at which point they will be disclosed by telephone to Kendra [name], as will any additional recommendations warranted by these results. Kendra [name] will receive a summary of her genetic counseling visit and a copy of her results  once available. This information will also be available in Epic.   Kendra [name] questions were answered to her satisfaction today. Our contact information was provided should additional questions or concerns arise. Thank you for the referral and allowing us  to share in the care of your patient.   Dena Cary, MS, [facility] Genetic Counselor [email or web address] Phone: [phone]  45 minutes were spent on the date of the encounter in service to the patient including preparation, face-to-face consultation, documentation and care coordination. Dr. Delinda was available for discussion regarding this case.   _______________________________________________________________________ For Office Staff:  Number of people involved in session: 1 Was an Intern/ student involved with case: no

## 2024-02-18 ENCOUNTER — Telehealth: Payer: Self-pay | Admitting: Licensed Clinical Social Worker

## 2024-02-18 ENCOUNTER — Encounter: Payer: Self-pay | Admitting: Licensed Clinical Social Worker

## 2024-02-18 ENCOUNTER — Ambulatory Visit: Payer: Self-pay | Admitting: Licensed Clinical Social Worker

## 2024-02-18 DIAGNOSIS — Z1379 Encounter for other screening for genetic and chromosomal anomalies: Secondary | ICD-10-CM

## 2024-02-18 NOTE — Progress Notes (Signed)
 HPI:   Ms. Slattery was previously seen in the [facility] clinic due to a personal and family history of cancer and concerns regarding a hereditary predisposition to cancer. Please refer to our prior cancer genetics clinic note for more information regarding our discussion, assessment and recommendations, at the time. Ms. Hardiman recent genetic test results were disclosed to her, as were recommendations warranted by these results. These results and recommendations are discussed in more detail below.  CANCER HISTORY:  Oncology History   No history exists.    FAMILY HISTORY:  We obtained a detailed, 4-generation family history.  Significant diagnoses are listed below: Family History  Problem Relation Age of Onset   Arthritis Mother    Hypothyroidism Mother    Breast cancer Mother 27   Colon cancer Mother 51   Lung cancer Father 79   Alcohol abuse Brother    Breast cancer Maternal Aunt 4   Brain cancer Paternal Aunt    Brain cancer Paternal Aunt    Lung cancer Paternal Aunt    Cancer Paternal Cousin        1st cousins w/ thyroid , prostate, 2nd cousin with ovarian       Ms. Koffler is unaware of previous family history of genetic testing for hereditary cancer risks. There is no reported Ashkenazi Jewish ancestry. There is no known consanguinity.  GENETIC TEST RESULTS:  The [facility] CancerNext-Expanded+RNA Panel found no pathogenic mutations.   The CancerNext-Expanded gene panel offered by [facility] and includes sequencing, rearrangement, and RNA analysis for the following 77 genes: AIP, ALK, APC, ATM, AXIN2, BAP1, BARD1, BMPR1A, BRCA1, BRCA2, BRIP1, CDC73, CDH1, CDK4, CDKN1B, CDKN2A, CEBPA, CHEK2, CTNNA1, DDX41, DICER1, ETV6, FH, FLCN, GATA2, LZTR1, MAX, MBD4, MEN1, MET, MLH1, MSH2, MSH3, MSH6, MUTYH, NF1, NF2, NTHL1, PALB2, PHOX2B, PMS2, POT1, PRKAR1A, PTCH1, PTEN, RAD51C, RAD51D, RB1, RET, RPS20, RUNX1, SDHA, SDHAF2, SDHB, SDHC, SDHD, SMAD4, SMARCA4, SMARCB1, SMARCE1,  STK11, SUFU, TMEM127, TP53, TSC1, TSC2, VHL, and WT1 (sequencing and deletion/duplication); EGFR, HOXB13, KIT, MITF, PDGFRA, POLD1, and POLE (sequencing only); EPCAM and GREM1 (deletion/duplication only).   The test report has been scanned into EPIC and is located under the Molecular Pathology section of the Results Review tab.  A portion of the result report is included below for reference. Genetic testing reported out on 02/15/2024.      Even though a pathogenic variant was not identified, possible explanations for the cancer in the family may include: There may be no hereditary risk for cancer in the family. The cancers in Ms. Reller and/or her family may be sporadic/familial or due to other genetic and environmental factors. There may be a gene mutation in one of these genes that current testing methods cannot detect but that chance is small. There could be another gene that has not yet been discovered, or that we have not yet tested, that is responsible for the cancer diagnoses in the family.  It is also possible there is a hereditary cause for the cancer in the family that Ms. Borelli did not inherit. Therefore, it is important to remain in touch with cancer genetics in the future so that we can continue to offer Ms. Sole the most up to date genetic testing.   ADDITIONAL GENETIC TESTING:  We discussed with Ms. Diez that her genetic testing was fairly extensive.  If there are additional relevant genes identified to increase cancer risk that can be analyzed in the future, we would be happy to discuss and coordinate this testing  at that time.    CANCER SCREENING RECOMMENDATIONS:  Ms. Marcella test result is considered negative (normal).  This means that we have not identified a hereditary cause for her personal and family history of cancer at this time.   An individual's cancer risk and medical management are not determined by genetic test results alone. Overall cancer risk assessment incorporates  additional factors, including personal medical history, family history, and any available genetic information that may result in a personalized plan for cancer prevention and surveillance. Therefore, it is recommended she continue to follow the cancer management and screening guidelines provided by her  primary healthcare provider.  RECOMMENDATIONS FOR FAMILY MEMBERS:   Since she did not inherit a identifiable mutation in a cancer predisposition gene included on this panel, her children could not have inherited a known mutation from her in one of these genes. Individuals in this family might be at some increased risk of developing cancer, over the general population risk, due to the family history of cancer.  Individuals in the family should notify their providers of the family history of cancer. We recommend women in this family have a yearly mammogram beginning at age 65, or 9 years younger than the earliest onset of cancer, an annual clinical breast exam, and perform monthly breast self-exams.  Family members should have colonoscopies by at age 48, or earlier, as recommended by their providers.   FOLLOW-UP:  Lastly, we discussed with Ms. Pemberton that cancer genetics is a rapidly advancing field and it is possible that new genetic tests will be appropriate for her and/or her family members in the future. We encouraged her to remain in contact with cancer genetics on an annual basis so we can update her personal and family histories and let her know of advances in cancer genetics that may benefit this family.   Our contact number was provided. Ms. Mcbreen questions were answered to her satisfaction, and she knows she is welcome to call us  at anytime with additional questions or concerns.    Dena Cary, MS, [facility] Genetic Counselor [email or web address] Phone: [phone]

## 2024-02-18 NOTE — Telephone Encounter (Signed)
 I contacted [name] to discuss her genetic testing results. No pathogenic variants were identified in the 77 genes analyzed. Detailed clinic note to follow.   The test report has been scanned into EPIC and is located under the Molecular Pathology section of the Results Review tab.  A portion of the result report is included below for reference.      Dena Cary, MS, [facility] Genetic Counselor [email or web address] Phone: [phone]

## 2024-03-21 ENCOUNTER — Other Ambulatory Visit: Payer: Self-pay

## 2024-03-24 NOTE — Progress Notes (Unsigned)
 [name] [address] [place] [address] Phone: [phone] Subjective:   Kendra [name], am serving as a scribe for Dr. Arthea Claudene.  I'm seeing this patient by the request  of:  Kendra Jon HERO, MD  CC: Right shoulder pain  YEP:Dlagzrupcz  Kendra [name] is a 53 y.o. female coming in with complaint of back and neck pain. OMT Jan 2025.  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  Complaining of right shoulder pain.  Patient has had difficulty with a previously well 2 injections.  Patient started patient states   Medications patient has been prescribed: None  Taking:         Reviewed prior external information including notes and imaging from previsou exam, outside providers and external EMR if available.   As well as notes that were available from care everywhere and other healthcare systems.  Past medical history, social, surgical and family history all reviewed in electronic medical record.  No pertanent information unless stated regarding to the chief complaint.   Past Medical History:  Diagnosis Date   Allergy    Breast cancer (HCC) left   2004 with chemo tx   Osteoporosis    Personal history of chemotherapy     No Known Allergies   Review of Systems:  No headache, visual changes, nausea, vomiting, diarrhea, constipation, dizziness, abdominal pain, skin rash, fevers, chills, night sweats, weight loss, swollen lymph nodes, body aches, joint swelling, chest pain, shortness of breath, mood changes. POSITIVE muscle aches  Objective  Blood pressure 120/82, pulse 84, height 5' 7 (1.702 m), weight 154 lb (69.9 kg), SpO2 98%.   General: No apparent distress alert and oriented x3 mood and affect normal, dressed appropriately.  HEENT: Pupils equal, extraocular movements intact  Respiratory: Patient's speak in full sentences and does not appear short of breath  Cardiovascular: No lower extremity  edema, non tender, no erythema  Gait MSK:  Back on low back does have some tightness noted in the paraspinal musculature.  This seems to be more in the right gluteal area.  Some tightness noted with certain range of motion. Right shoulder shows positive crossover noted.  Rotator cuff strength intact.  Procedure: Real-time Ultrasound Guided Injection of right acromioclavicular joint Device: GE Logiq Q7 Ultrasound guided injection is preferred based studies that show increased duration, increased effect, greater accuracy, decreased procedural pain, increased response rate, and decreased cost with ultrasound guided versus blind injection.  Verbal informed consent obtained.  Time-out conducted.  Noted no overlying erythema, induration, or other signs of local infection.  Skin prepped in a sterile fashion.  Local anesthesia: Topical Ethyl chloride.  With sterile technique and under real time ultrasound guidance: With a 25-gauge half inch needle injected with 0.5 cc of 0.5% Marcaine and 0.5 cc of Kenalog  40 mg/mL Completed without difficulty  Pain immediately resolved suggesting accurate placement of the medication.  Advised to call if fevers/chills, erythema, induration, drainage, or persistent bleeding.  Impression: Technically successful ultrasound guided injection.  Osteopathic findings  C2 flexed rotated and side bent right C7 flexed rotated and side bent left T3 extended rotated and side bent right inhaled rib T9 extended rotated and side bent left L1 flexed rotated and side bent right Sacrum right on right       Assessment and Plan:  Arthritis of right acromioclavicular joint Exacerbation of an underlying disease.  This was secondary to patient being the primary caregiver for her  ailing mother.  I do think that this could have potentially contributed to more the discomfort and pain.  Discussed icing regimen and home exercises, discussed which activities to do and which ones to  avoid.  Increase activity slowly.  Follow-up again in 6 to 8 weeks otherwise.  SI (sacroiliac) joint dysfunction Tightness noted again, been sometime since we have seen patient.  She has not been quite as active as usual.  We discussed icing regimen and home exercises, discussed which activities to do and which ones to avoid.  Increase activity slowly.  Follow-up with me again in 6 to 8 weeks    Nonallopathic problems  Decision today to treat with OMT was based on Physical Exam  After verbal consent patient was treated with HVLA, ME, FPR techniques in cervical, rib, thoracic, lumbar, and sacral  areas  Patient tolerated the procedure well with improvement in symptoms  Patient given exercises, stretches and lifestyle modifications  See medications in patient instructions if given  Patient will follow up in 4-8 weeks     The above documentation has been reviewed and is accurate and complete Kendra [name] M Halana Deisher, DO         Note: This dictation was prepared with Dragon dictation along with smaller phrase technology. Any transcriptional errors that result from this process are unintentional.

## 2024-03-26 ENCOUNTER — Ambulatory Visit: Admitting: Family Medicine

## 2024-03-26 ENCOUNTER — Other Ambulatory Visit: Payer: Self-pay

## 2024-03-26 ENCOUNTER — Encounter: Payer: Self-pay | Admitting: Family Medicine

## 2024-03-26 VITALS — BP 120/82 | HR 84 | Ht 67.0 in | Wt 154.0 lb

## 2024-03-26 DIAGNOSIS — M9902 Segmental and somatic dysfunction of thoracic region: Secondary | ICD-10-CM

## 2024-03-26 DIAGNOSIS — M533 Sacrococcygeal disorders, not elsewhere classified: Secondary | ICD-10-CM | POA: Diagnosis not present

## 2024-03-26 DIAGNOSIS — M9908 Segmental and somatic dysfunction of rib cage: Secondary | ICD-10-CM | POA: Diagnosis not present

## 2024-03-26 DIAGNOSIS — M9901 Segmental and somatic dysfunction of cervical region: Secondary | ICD-10-CM | POA: Diagnosis not present

## 2024-03-26 DIAGNOSIS — M9903 Segmental and somatic dysfunction of lumbar region: Secondary | ICD-10-CM | POA: Diagnosis not present

## 2024-03-26 DIAGNOSIS — M9904 Segmental and somatic dysfunction of sacral region: Secondary | ICD-10-CM | POA: Diagnosis not present

## 2024-03-26 DIAGNOSIS — M19011 Primary osteoarthritis, right shoulder: Secondary | ICD-10-CM

## 2024-03-26 NOTE — Patient Instructions (Signed)
 Injection in shoulder today Good to see you! See you again in 2 months

## 2024-03-26 NOTE — Assessment & Plan Note (Signed)
 Exacerbation of an underlying disease.  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  I do think that this could have potentially contributed to more the discomfort and pain.  Discussed icing regimen and home exercises, discussed which activities to do and which ones to avoid.  Increase activity slowly.  Follow-up again in 6 to 8 weeks otherwise.

## 2024-03-26 NOTE — Assessment & Plan Note (Signed)
 Tightness noted again, been sometime since we have seen patient.  She has not been quite as active as usual.  We discussed icing regimen and home exercises, discussed which activities to do and which ones to avoid.  Increase activity slowly.  Follow-up with me again in 6 to 8 weeks

## 2024-04-07 ENCOUNTER — Other Ambulatory Visit: Payer: Self-pay

## 2024-05-06 NOTE — Progress Notes (Deleted)
  [facility] [address] [place] [address] Phone: [phone] Subjective:    I'm seeing this patient by the request  of:  Bacigalupo, Jon HERO, MD  CC:   YEP:Dlagzrupcz  Kendra [name] is a 53 y.o. female coming in with complaint of back and neck pain. OMT 03/26/2024. Also f/u for R shoulder pain. Patient states   Medications patient has been prescribed: None  Taking:         Reviewed prior external information including notes and imaging from previsou exam, outside providers and external EMR if available.   As well as notes that were available from care everywhere and other healthcare systems.  Past medical history, social, surgical and family history all reviewed in electronic medical record.  No pertanent information unless stated regarding to the chief complaint.   Past Medical History:  Diagnosis Date   Allergy    Breast cancer (HCC) left   2004 with chemo tx   Osteoporosis    Personal history of chemotherapy     No Known Allergies   Review of Systems:  No headache, visual changes, nausea, vomiting, diarrhea, constipation, dizziness, abdominal pain, skin rash, fevers, chills, night sweats, weight loss, swollen lymph nodes, body aches, joint swelling, chest pain, shortness of breath, mood changes. POSITIVE muscle aches  Objective  There were no vitals taken for this visit.   General: No apparent distress alert and oriented x3 mood and affect normal, dressed appropriately.  HEENT: Pupils equal, extraocular movements intact  Respiratory: Patient's speak in full sentences and does not appear short of breath  Cardiovascular: No lower extremity edema, non tender, no erythema  Gait MSK:  Back   Osteopathic findings  C2 flexed rotated and side bent right C6 flexed rotated and side bent left T3 extended rotated and side bent right inhaled rib T9 extended rotated and side bent left L2 flexed rotated and side bent  right Sacrum right on right       Assessment and Plan:  No problem-specific Assessment & Plan notes found for this encounter.    Nonallopathic problems  Decision today to treat with OMT was based on Physical Exam  After verbal consent patient was treated with HVLA, ME, FPR techniques in cervical, rib, thoracic, lumbar, and sacral  areas  Patient tolerated the procedure well with improvement in symptoms  Patient given exercises, stretches and lifestyle modifications  See medications in patient instructions if given  Patient will follow up in 4-8 weeks             Note: This dictation was prepared with Dragon dictation along with smaller phrase technology. Any transcriptional errors that result from this process are unintentional.

## 2024-05-07 ENCOUNTER — Other Ambulatory Visit: Payer: Self-pay

## 2024-05-11 ENCOUNTER — Other Ambulatory Visit: Payer: Self-pay

## 2024-05-11 ENCOUNTER — Encounter: Payer: Self-pay | Admitting: Emergency Medicine

## 2024-05-11 ENCOUNTER — Ambulatory Visit
Admission: EM | Admit: 2024-05-11 | Discharge: 2024-05-11 | Disposition: A | Discharge status: Home / Self Care | Attending: Nurse Practitioner | Admitting: Nurse Practitioner

## 2024-05-11 DIAGNOSIS — J069 Acute upper respiratory infection, unspecified: Secondary | ICD-10-CM | POA: Diagnosis not present

## 2024-05-11 MED ORDER — BENZONATATE 100 MG PO CAPS: 100.0000 mg | ORAL_CAPSULE | Freq: Three times a day (TID) | ORAL | 0 refills | Status: AC | PRN

## 2024-05-11 MED ORDER — AMOXICILLIN-POT CLAVULANATE 875-125 MG PO TABS
1.0000 | ORAL_TABLET | Freq: Two times a day (BID) | ORAL | 0 refills | Status: AC
  Filled 2024-05-11: qty 14, 7d supply, fill #0
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
## 2024-05-11 NOTE — ED Triage Notes (Signed)
 Pt c/o nasal congestion, sinus pain/pressure, cough. Started about a week ago. Denies fever. She has tried otc medications without relief.

## 2024-05-11 NOTE — Discharge Instructions (Addendum)
 You have a viral upper respiratory infection.  Symptoms should improve over the few days.  If you develop chest pain or shortness of breath, go to the emergency room.  If symptoms are not much better by 05/13/24, take the Augmentin  that has been sent to [facility] pharmacy.  Some things that can make you feel better are: - Increased rest - Increasing fluid with water/sugar free electrolytes - Acetaminophen and ibuprofen  as needed for fever/pain - Salt water gargling, chloraseptic spray and throat lozenges for sore throat - OTC guaifenesin (Mucinex) 600 mg twice daily for congestion - Saline sinus flushes or a neti pot - Humidifying the air  Please stop the decongestant.  You can continue Flonase  nasal spray.

## 2024-05-12 ENCOUNTER — Ambulatory Visit: Admitting: Family Medicine

## 2024-07-01 ENCOUNTER — Other Ambulatory Visit: Payer: Self-pay

## 2024-10-09 ENCOUNTER — Encounter: Admitting: Family Medicine
